# Patient Record
Sex: Female | Born: 1965 | Race: Black or African American | Hispanic: No | Marital: Single | Smoking: Current some day smoker
Health system: Southern US, Community
[De-identification: ages and names within clinical notes are randomized; demographics above are authoritative.]

## PROBLEM LIST (undated history)

## (undated) DIAGNOSIS — F32A Depression, unspecified: Secondary | ICD-10-CM

## (undated) DIAGNOSIS — F419 Anxiety disorder, unspecified: Secondary | ICD-10-CM

## (undated) DIAGNOSIS — Z72820 Sleep deprivation: Secondary | ICD-10-CM

## (undated) DIAGNOSIS — D573 Sickle-cell trait: Secondary | ICD-10-CM

## (undated) DIAGNOSIS — E039 Hypothyroidism, unspecified: Secondary | ICD-10-CM

## (undated) DIAGNOSIS — M1990 Unspecified osteoarthritis, unspecified site: Secondary | ICD-10-CM

## (undated) DIAGNOSIS — F329 Major depressive disorder, single episode, unspecified: Secondary | ICD-10-CM

## (undated) DIAGNOSIS — IMO0002 Reserved for concepts with insufficient information to code with codable children: Secondary | ICD-10-CM

## (undated) DIAGNOSIS — R011 Cardiac murmur, unspecified: Secondary | ICD-10-CM

## (undated) DIAGNOSIS — N289 Disorder of kidney and ureter, unspecified: Secondary | ICD-10-CM

---

## 1997-07-21 ENCOUNTER — Ambulatory Visit (HOSPITAL_COMMUNITY): Admission: RE | Admit: 1997-07-21 | Discharge: 1997-07-21 | Payer: Self-pay | Admitting: *Deleted

## 1997-08-19 ENCOUNTER — Emergency Department (HOSPITAL_COMMUNITY): Admission: EM | Admit: 1997-08-19 | Discharge: 1997-08-19 | Payer: Self-pay | Admitting: Emergency Medicine

## 1997-08-29 ENCOUNTER — Inpatient Hospital Stay (HOSPITAL_COMMUNITY): Admission: AD | Admit: 1997-08-29 | Discharge: 1997-08-31 | Payer: Self-pay | Admitting: Obstetrics & Gynecology

## 1997-09-07 ENCOUNTER — Encounter: Admission: RE | Admit: 1997-09-07 | Discharge: 1997-12-06 | Payer: Self-pay | Admitting: Obstetrics

## 1997-09-23 ENCOUNTER — Inpatient Hospital Stay (HOSPITAL_COMMUNITY): Admission: AD | Admit: 1997-09-23 | Discharge: 1997-10-03 | Payer: Self-pay | Admitting: *Deleted

## 1997-10-07 ENCOUNTER — Inpatient Hospital Stay (HOSPITAL_COMMUNITY): Admission: AD | Admit: 1997-10-07 | Discharge: 1997-10-07 | Payer: Self-pay | Admitting: *Deleted

## 1998-03-07 ENCOUNTER — Emergency Department (HOSPITAL_COMMUNITY): Admission: EM | Admit: 1998-03-07 | Discharge: 1998-03-07 | Payer: Self-pay | Admitting: Emergency Medicine

## 1999-09-02 ENCOUNTER — Encounter: Payer: Self-pay | Admitting: Emergency Medicine

## 1999-09-02 ENCOUNTER — Emergency Department (HOSPITAL_COMMUNITY): Admission: EM | Admit: 1999-09-02 | Discharge: 1999-09-02 | Payer: Self-pay | Admitting: Emergency Medicine

## 1999-12-17 ENCOUNTER — Emergency Department (HOSPITAL_COMMUNITY): Admission: EM | Admit: 1999-12-17 | Discharge: 1999-12-17 | Payer: Self-pay | Admitting: Emergency Medicine

## 1999-12-17 ENCOUNTER — Encounter: Payer: Self-pay | Admitting: Emergency Medicine

## 2000-04-20 ENCOUNTER — Emergency Department (HOSPITAL_COMMUNITY): Admission: EM | Admit: 2000-04-20 | Discharge: 2000-04-20 | Payer: Self-pay | Admitting: Emergency Medicine

## 2000-06-06 ENCOUNTER — Emergency Department (HOSPITAL_COMMUNITY): Admission: EM | Admit: 2000-06-06 | Discharge: 2000-06-07 | Payer: Self-pay | Admitting: Emergency Medicine

## 2000-06-07 ENCOUNTER — Encounter: Payer: Self-pay | Admitting: Emergency Medicine

## 2000-10-15 ENCOUNTER — Emergency Department (HOSPITAL_COMMUNITY): Admission: EM | Admit: 2000-10-15 | Discharge: 2000-10-15 | Payer: Self-pay

## 2001-03-22 ENCOUNTER — Emergency Department (HOSPITAL_COMMUNITY): Admission: EM | Admit: 2001-03-22 | Discharge: 2001-03-23 | Payer: Self-pay | Admitting: Emergency Medicine

## 2001-03-23 ENCOUNTER — Encounter: Payer: Self-pay | Admitting: Emergency Medicine

## 2002-02-18 ENCOUNTER — Emergency Department (HOSPITAL_COMMUNITY): Admission: EM | Admit: 2002-02-18 | Discharge: 2002-02-18 | Payer: Self-pay | Admitting: Emergency Medicine

## 2002-05-12 ENCOUNTER — Encounter: Payer: Self-pay | Admitting: Endocrinology

## 2002-05-12 ENCOUNTER — Ambulatory Visit (HOSPITAL_COMMUNITY): Admission: RE | Admit: 2002-05-12 | Discharge: 2002-05-12 | Payer: Self-pay | Admitting: Endocrinology

## 2002-05-27 ENCOUNTER — Ambulatory Visit (HOSPITAL_COMMUNITY): Admission: RE | Admit: 2002-05-27 | Discharge: 2002-05-27 | Payer: Self-pay | Admitting: Endocrinology

## 2002-05-27 ENCOUNTER — Encounter: Payer: Self-pay | Admitting: Endocrinology

## 2004-01-23 ENCOUNTER — Ambulatory Visit: Payer: Self-pay | Admitting: Family Medicine

## 2004-11-21 ENCOUNTER — Emergency Department (HOSPITAL_COMMUNITY): Admission: EM | Admit: 2004-11-21 | Discharge: 2004-11-21 | Payer: Self-pay | Admitting: Emergency Medicine

## 2005-01-22 ENCOUNTER — Emergency Department (HOSPITAL_COMMUNITY): Admission: EM | Admit: 2005-01-22 | Discharge: 2005-01-23 | Payer: Self-pay | Admitting: Emergency Medicine

## 2005-05-13 ENCOUNTER — Encounter
Admission: RE | Admit: 2005-05-13 | Discharge: 2005-07-04 | Payer: Self-pay | Admitting: Physical Medicine and Rehabilitation

## 2005-07-23 ENCOUNTER — Encounter: Admission: RE | Admit: 2005-07-23 | Discharge: 2005-07-23 | Payer: Self-pay | Admitting: Cardiology

## 2005-12-04 ENCOUNTER — Emergency Department (HOSPITAL_COMMUNITY): Admission: EM | Admit: 2005-12-04 | Discharge: 2005-12-04 | Payer: Self-pay | Admitting: Emergency Medicine

## 2006-08-21 ENCOUNTER — Emergency Department (HOSPITAL_COMMUNITY): Admission: EM | Admit: 2006-08-21 | Discharge: 2006-08-21 | Payer: Self-pay | Admitting: Family Medicine

## 2007-11-13 ENCOUNTER — Emergency Department (HOSPITAL_COMMUNITY): Admission: EM | Admit: 2007-11-13 | Discharge: 2007-11-13 | Payer: Self-pay | Admitting: Family Medicine

## 2008-07-09 ENCOUNTER — Emergency Department (HOSPITAL_COMMUNITY): Admission: EM | Admit: 2008-07-09 | Discharge: 2008-07-10 | Payer: Self-pay | Admitting: Emergency Medicine

## 2009-11-16 ENCOUNTER — Encounter: Admission: RE | Admit: 2009-11-16 | Discharge: 2009-11-16 | Payer: Self-pay | Admitting: Internal Medicine

## 2009-11-27 ENCOUNTER — Encounter: Admission: RE | Admit: 2009-11-27 | Discharge: 2009-11-27 | Payer: Self-pay | Admitting: Internal Medicine

## 2010-03-12 ENCOUNTER — Other Ambulatory Visit: Payer: Self-pay | Admitting: Internal Medicine

## 2010-03-12 DIAGNOSIS — Z09 Encounter for follow-up examination after completed treatment for conditions other than malignant neoplasm: Secondary | ICD-10-CM

## 2010-03-12 DIAGNOSIS — N6002 Solitary cyst of left breast: Secondary | ICD-10-CM

## 2010-03-26 ENCOUNTER — Emergency Department (HOSPITAL_COMMUNITY)
Admission: EM | Admit: 2010-03-26 | Discharge: 2010-03-26 | Disposition: A | Discharge status: Home / Self Care | Payer: Medicare Other | Attending: Emergency Medicine | Admitting: Emergency Medicine

## 2010-03-26 ENCOUNTER — Emergency Department (HOSPITAL_COMMUNITY): Payer: Medicare Other

## 2010-03-26 DIAGNOSIS — J3489 Other specified disorders of nose and nasal sinuses: Secondary | ICD-10-CM | POA: Insufficient documentation

## 2010-03-26 DIAGNOSIS — J40 Bronchitis, not specified as acute or chronic: Secondary | ICD-10-CM | POA: Insufficient documentation

## 2010-03-26 DIAGNOSIS — F329 Major depressive disorder, single episode, unspecified: Secondary | ICD-10-CM | POA: Insufficient documentation

## 2010-03-26 DIAGNOSIS — R0602 Shortness of breath: Secondary | ICD-10-CM | POA: Insufficient documentation

## 2010-03-26 DIAGNOSIS — R059 Cough, unspecified: Secondary | ICD-10-CM | POA: Insufficient documentation

## 2010-03-26 DIAGNOSIS — R05 Cough: Secondary | ICD-10-CM | POA: Insufficient documentation

## 2010-03-26 DIAGNOSIS — F3289 Other specified depressive episodes: Secondary | ICD-10-CM | POA: Insufficient documentation

## 2010-03-26 DIAGNOSIS — E039 Hypothyroidism, unspecified: Secondary | ICD-10-CM | POA: Insufficient documentation

## 2010-03-26 DIAGNOSIS — J45909 Unspecified asthma, uncomplicated: Secondary | ICD-10-CM | POA: Insufficient documentation

## 2010-05-31 ENCOUNTER — Other Ambulatory Visit: Payer: Self-pay

## 2010-06-21 ENCOUNTER — Other Ambulatory Visit: Payer: Medicare Other

## 2010-07-12 ENCOUNTER — Ambulatory Visit
Admission: RE | Admit: 2010-07-12 | Discharge: 2010-07-12 | Disposition: A | Discharge status: Home / Self Care | Payer: Medicare Other | Source: Ambulatory Visit | Attending: Internal Medicine | Admitting: Internal Medicine

## 2010-07-12 DIAGNOSIS — N6002 Solitary cyst of left breast: Secondary | ICD-10-CM

## 2010-10-17 ENCOUNTER — Other Ambulatory Visit: Payer: Self-pay | Admitting: Internal Medicine

## 2010-10-17 DIAGNOSIS — Z1231 Encounter for screening mammogram for malignant neoplasm of breast: Secondary | ICD-10-CM

## 2010-10-28 LAB — POCT URINALYSIS DIP (DEVICE)
Bilirubin Urine: NEGATIVE
Glucose, UA: NEGATIVE
Ketones, ur: NEGATIVE
Operator id: 235561
Specific Gravity, Urine: 1.015

## 2010-11-20 ENCOUNTER — Ambulatory Visit
Admission: RE | Admit: 2010-11-20 | Discharge: 2010-11-20 | Disposition: A | Discharge status: Home / Self Care | Payer: Medicare Other | Source: Ambulatory Visit | Attending: Internal Medicine | Admitting: Internal Medicine

## 2010-11-20 DIAGNOSIS — Z1231 Encounter for screening mammogram for malignant neoplasm of breast: Secondary | ICD-10-CM

## 2011-02-07 ENCOUNTER — Encounter (HOSPITAL_COMMUNITY): Payer: Self-pay | Admitting: *Deleted

## 2011-02-07 ENCOUNTER — Emergency Department (HOSPITAL_COMMUNITY)
Admission: EM | Admit: 2011-02-07 | Discharge: 2011-02-07 | Disposition: A | Discharge status: Home / Self Care | Payer: Medicare Other | Attending: Emergency Medicine | Admitting: Emergency Medicine

## 2011-02-07 DIAGNOSIS — R05 Cough: Secondary | ICD-10-CM | POA: Insufficient documentation

## 2011-02-07 DIAGNOSIS — R51 Headache: Secondary | ICD-10-CM | POA: Insufficient documentation

## 2011-02-07 DIAGNOSIS — R059 Cough, unspecified: Secondary | ICD-10-CM | POA: Insufficient documentation

## 2011-02-07 DIAGNOSIS — B349 Viral infection, unspecified: Secondary | ICD-10-CM

## 2011-02-07 DIAGNOSIS — R011 Cardiac murmur, unspecified: Secondary | ICD-10-CM | POA: Insufficient documentation

## 2011-02-07 DIAGNOSIS — R509 Fever, unspecified: Secondary | ICD-10-CM | POA: Insufficient documentation

## 2011-02-07 DIAGNOSIS — IMO0001 Reserved for inherently not codable concepts without codable children: Secondary | ICD-10-CM | POA: Insufficient documentation

## 2011-02-07 DIAGNOSIS — J029 Acute pharyngitis, unspecified: Secondary | ICD-10-CM | POA: Insufficient documentation

## 2011-02-07 DIAGNOSIS — B9789 Other viral agents as the cause of diseases classified elsewhere: Secondary | ICD-10-CM | POA: Insufficient documentation

## 2011-02-07 HISTORY — DX: Major depressive disorder, single episode, unspecified: F32.9

## 2011-02-07 HISTORY — DX: Depression, unspecified: F32.A

## 2011-02-07 HISTORY — DX: Reserved for concepts with insufficient information to code with codable children: IMO0002

## 2011-02-07 HISTORY — DX: Unspecified osteoarthritis, unspecified site: M19.90

## 2011-02-07 HISTORY — DX: Cardiac murmur, unspecified: R01.1

## 2011-02-07 HISTORY — DX: Sleep deprivation: Z72.820

## 2011-02-07 MED ORDER — ACETAMINOPHEN 325 MG PO TABS
ORAL_TABLET | ORAL | Status: AC
  Administered 2011-02-07: 975 mg
  Filled 2011-02-07: qty 3

## 2011-02-07 MED ORDER — ACETAMINOPHEN 500 MG PO TABS
1000.0000 mg | ORAL_TABLET | Freq: Once | ORAL | Status: DC
  Filled 2011-02-07: qty 2

## 2011-02-07 MED ORDER — IBUPROFEN 800 MG PO TABS
800.0000 mg | ORAL_TABLET | Freq: Once | ORAL | Status: AC
  Administered 2011-02-07: 800 mg via ORAL
  Filled 2011-02-07: qty 1

## 2011-02-07 MED ORDER — OSELTAMIVIR PHOSPHATE 75 MG PO CAPS: 75.0000 mg | ORAL_CAPSULE | Freq: Two times a day (BID) | ORAL | Status: AC

## 2011-02-07 NOTE — ED Provider Notes (Signed)
History     CSN: [id number]  Arrival date & time 02/07/11  0908   First MD Initiated Contact with Patient 02/07/11 0915      Chief Complaint  Patient presents with  . Chills  . Influenza    (Consider location/radiation/quality/duration/timing/severity/associated sxs/prior treatment) HPI... sore throat, headache, fever, chills, cough, achiness for 24 hours.  Has not had the flu shot. Ibuprofen helps some. Good by mouth intake. Symptoms are moderate.  Past Medical History  Diagnosis Date  . Arthritis   . Prolapsed disk   . Heart murmur   . Sleep deprivation   . Depression     Past Surgical History  Procedure Date  . Cesarean section     No family history on file.  History  Substance Use Topics  . Smoking status: Former Games developer  . Smokeless tobacco: Not on file  . Alcohol Use: No    OB History    Grav Para Term Preterm Abortions TAB SAB Ect Mult Living                  Review of Systems  All other systems reviewed and are negative.    Allergies  Review of patient's allergies indicates no known allergies.  Home Medications   Current Outpatient Rx  Name Route Sig Dispense Refill  . BECLOMETHASONE DIPROPIONATE 40 MCG/ACT IN AERS Inhalation Inhale 2 puffs into the lungs 2 (two) times daily.    Marland Kitchen BENZTROPINE MESYLATE 1 MG PO TABS Oral Take 1 mg by mouth daily.    Marland Kitchen FLUOXETINE HCL 20 MG PO CAPS Oral Take 20 mg by mouth daily.    . IBUPROFEN 800 MG PO TABS Oral Take 800 mg by mouth every 8 (eight) hours as needed. For pain    . LEVOTHYROXINE SODIUM 75 MCG PO TABS Oral Take 75 mcg by mouth daily.    Marland Kitchen MIRTAZAPINE 15 MG PO TABS Oral Take 15 mg by mouth at bedtime.    . OXYCODONE-ACETAMINOPHEN 10-325 MG PO TABS Oral Take 1 tablet by mouth 2 (two) times daily.    Marland Kitchen RISPERIDONE 4 MG PO TABS Oral Take 4 mg by mouth daily.    . TRAZODONE HCL 100 MG PO TABS Oral Take 100 mg by mouth at bedtime.    . OSELTAMIVIR PHOSPHATE 75 MG PO CAPS Oral Take 1 capsule (75 mg total)  by mouth every 12 (twelve) hours. 10 capsule 0    BP 118/75  Pulse 115  Temp(Src) 98.7 F (37.1 C) (Oral)  Resp 18  SpO2 98%  Physical Exam  Nursing note and vitals reviewed. Constitutional: She is oriented to person, place, and time. She appears well-developed and well-nourished.       Well-hydrated  HENT:  Head: Normocephalic and atraumatic.  Eyes: Conjunctivae and EOM are normal. Pupils are equal, round, and reactive to light.  Neck: Normal range of motion. Neck supple.  Cardiovascular: Normal rate and regular rhythm.   Pulmonary/Chest: Effort normal and breath sounds normal.  Abdominal: Soft. Bowel sounds are normal.  Musculoskeletal: Normal range of motion.  Neurological: She is alert and oriented to person, place, and time.  Skin: Skin is warm and dry.  Psychiatric: She has a normal mood and affect.    ED Course  Procedures (including critical care time)  Labs Reviewed - No data to display No results found.   1. Viral syndrome       MDM  Patient is nontoxic. History and physical most consistent with influenza. Rx  Tamiflu        Donnetta Hutching, MD 02/07/11 [phone]

## 2011-02-07 NOTE — ED Notes (Signed)
Flu like symptoms; h/a, chills, hot, body aches, back ache, eyes itching, etc. Only able to keep fluids down, no solids. Non-productive cough.

## 2011-11-11 ENCOUNTER — Other Ambulatory Visit: Payer: Self-pay | Admitting: Internal Medicine

## 2011-11-11 DIAGNOSIS — Z1231 Encounter for screening mammogram for malignant neoplasm of breast: Secondary | ICD-10-CM

## 2011-12-11 ENCOUNTER — Ambulatory Visit: Payer: Medicare Other

## 2011-12-15 ENCOUNTER — Encounter (HOSPITAL_COMMUNITY): Payer: Self-pay

## 2011-12-15 ENCOUNTER — Observation Stay (HOSPITAL_COMMUNITY)
Admission: EM | Admit: 2011-12-15 | Discharge: 2011-12-19 | Disposition: A | Discharge status: Home / Self Care | Payer: Medicare Other | Attending: Internal Medicine | Admitting: Internal Medicine

## 2011-12-15 DIAGNOSIS — F32A Depression, unspecified: Secondary | ICD-10-CM | POA: Diagnosis present

## 2011-12-15 DIAGNOSIS — F329 Major depressive disorder, single episode, unspecified: Secondary | ICD-10-CM | POA: Diagnosis present

## 2011-12-15 DIAGNOSIS — K5289 Other specified noninfective gastroenteritis and colitis: Secondary | ICD-10-CM

## 2011-12-15 DIAGNOSIS — E876 Hypokalemia: Secondary | ICD-10-CM | POA: Insufficient documentation

## 2011-12-15 DIAGNOSIS — K529 Noninfective gastroenteritis and colitis, unspecified: Secondary | ICD-10-CM

## 2011-12-15 DIAGNOSIS — E039 Hypothyroidism, unspecified: Secondary | ICD-10-CM | POA: Diagnosis present

## 2011-12-15 DIAGNOSIS — R109 Unspecified abdominal pain: Secondary | ICD-10-CM | POA: Insufficient documentation

## 2011-12-15 DIAGNOSIS — N390 Urinary tract infection, site not specified: Secondary | ICD-10-CM | POA: Diagnosis present

## 2011-12-15 DIAGNOSIS — F3289 Other specified depressive episodes: Secondary | ICD-10-CM | POA: Insufficient documentation

## 2011-12-15 DIAGNOSIS — R112 Nausea with vomiting, unspecified: Principal | ICD-10-CM | POA: Diagnosis present

## 2011-12-15 DIAGNOSIS — Z79899 Other long term (current) drug therapy: Secondary | ICD-10-CM | POA: Insufficient documentation

## 2011-12-15 DIAGNOSIS — D649 Anemia, unspecified: Secondary | ICD-10-CM | POA: Insufficient documentation

## 2011-12-15 LAB — CBC
Hemoglobin: 13 g/dL (ref 12.0–15.0)
Hemoglobin: 11.9 g/dL — ABNORMAL LOW (ref 12.0–15.0)
MCH: 23.3 pg — ABNORMAL LOW (ref 26.0–34.0)
Platelets: 209 10*3/uL (ref 150–400)
RBC: 5.59 MIL/uL — ABNORMAL HIGH (ref 3.87–5.11)
RBC: 5.08 MIL/uL (ref 3.87–5.11)
WBC: 8.5 10*3/uL (ref 4.0–10.5)
WBC: 8.9 10*3/uL (ref 4.0–10.5)

## 2011-12-15 LAB — URINE MICROSCOPIC-ADD ON

## 2011-12-15 LAB — URINALYSIS, ROUTINE W REFLEX MICROSCOPIC
Bilirubin Urine: NEGATIVE
Glucose, UA: NEGATIVE mg/dL
Ketones, ur: 40 mg/dL — AB
Protein, ur: NEGATIVE mg/dL
Urobilinogen, UA: 0.2 mg/dL (ref 0.0–1.0)

## 2011-12-15 LAB — COMPREHENSIVE METABOLIC PANEL
ALT: 19 U/L (ref 0–35)
Alkaline Phosphatase: 55 U/L (ref 39–117)
CO2: 23 mEq/L (ref 19–32)
Calcium: 9.5 mg/dL (ref 8.4–10.5)
Chloride: 105 mEq/L (ref 96–112)
GFR calc Af Amer: 87 mL/min — ABNORMAL LOW (ref >90)
GFR calc non Af Amer: 75 mL/min — ABNORMAL LOW (ref >90)
Glucose, Bld: 124 mg/dL — ABNORMAL HIGH (ref 70–99)
Sodium: 141 mEq/L (ref 135–145)
Total Bilirubin: 0.7 mg/dL (ref 0.3–1.2)

## 2011-12-15 LAB — CREATININE, SERUM
Creatinine, Ser: 0.82 mg/dL (ref 0.50–1.10)
GFR calc Af Amer: >90 mL/min (ref >90)
GFR calc non Af Amer: 86 mL/min — ABNORMAL LOW (ref >90)

## 2011-12-15 MED ORDER — TRAZODONE HCL 100 MG PO TABS
100.0000 mg | ORAL_TABLET | Freq: Every day | ORAL | Status: DC
  Administered 2011-12-16 – 2011-12-18 (×3): 100 mg via ORAL
  Filled 2011-12-15 (×7): qty 1

## 2011-12-15 MED ORDER — ONDANSETRON HCL 4 MG PO TABS
4.0000 mg | ORAL_TABLET | Freq: Four times a day (QID) | ORAL | Status: DC | PRN
  Filled 2011-12-15: qty 1

## 2011-12-15 MED ORDER — BENZTROPINE MESYLATE 1 MG PO TABS
1.0000 mg | ORAL_TABLET | Freq: Every day | ORAL | Status: DC
  Administered 2011-12-18 – 2011-12-19 (×2): 1 mg via ORAL
  Filled 2011-12-15 (×5): qty 1

## 2011-12-15 MED ORDER — RISPERIDONE 2 MG PO TABS
4.0000 mg | ORAL_TABLET | Freq: Every day | ORAL | Status: DC
  Administered 2011-12-18 – 2011-12-19 (×2): 4 mg via ORAL
  Filled 2011-12-15 (×5): qty 2

## 2011-12-15 MED ORDER — ONDANSETRON HCL 4 MG/2ML IJ SOLN
4.0000 mg | Freq: Once | INTRAMUSCULAR | Status: AC
  Administered 2011-12-15: 4 mg via INTRAVENOUS
  Filled 2011-12-15: qty 2

## 2011-12-15 MED ORDER — HYDROCODONE-ACETAMINOPHEN 5-325 MG PO TABS
1.0000 | ORAL_TABLET | ORAL | Status: DC | PRN
  Administered 2011-12-18 – 2011-12-19 (×4): 2 via ORAL
  Filled 2011-12-15 (×4): qty 2

## 2011-12-15 MED ORDER — OXYCODONE-ACETAMINOPHEN 5-325 MG PO TABS
1.0000 | ORAL_TABLET | Freq: Four times a day (QID) | ORAL | Status: AC | PRN
  Administered 2011-12-15 – 2011-12-17 (×2): 1 via ORAL
  Filled 2011-12-15 (×2): qty 1

## 2011-12-15 MED ORDER — MORPHINE SULFATE 4 MG/ML IJ SOLN
1.0000 mg | INTRAMUSCULAR | Status: DC | PRN
  Administered 2011-12-15 – 2011-12-19 (×15): 1 mg via INTRAVENOUS
  Filled 2011-12-15 (×15): qty 1

## 2011-12-15 MED ORDER — SODIUM CHLORIDE 0.9 % IV SOLN
1000.0000 mL | Freq: Once | INTRAVENOUS | Status: AC
  Administered 2011-12-15: 1000 mL via INTRAVENOUS

## 2011-12-15 MED ORDER — DEXTROSE 5 % IV SOLN
1.0000 g | INTRAVENOUS | Status: DC
  Administered 2011-12-16 – 2011-12-19 (×4): 1 g via INTRAVENOUS
  Filled 2011-12-15 (×4): qty 10

## 2011-12-15 MED ORDER — HYDROMORPHONE HCL PF 1 MG/ML IJ SOLN: 1.0000 mg | Freq: Once | INTRAMUSCULAR | Status: DC

## 2011-12-15 MED ORDER — DIPHENHYDRAMINE HCL 50 MG/ML IJ SOLN
12.5000 mg | Freq: Once | INTRAMUSCULAR | Status: AC
  Administered 2011-12-15: 12.5 mg via INTRAVENOUS
  Filled 2011-12-15: qty 1

## 2011-12-15 MED ORDER — LOPERAMIDE HCL 2 MG PO CAPS: 4.0000 mg | ORAL_CAPSULE | ORAL | Status: DC | PRN

## 2011-12-15 MED ORDER — PROMETHAZINE HCL 25 MG/ML IJ SOLN
12.5000 mg | Freq: Once | INTRAMUSCULAR | Status: AC
  Administered 2011-12-15: 12.5 mg via INTRAVENOUS
  Filled 2011-12-15: qty 1

## 2011-12-15 MED ORDER — DIPHENOXYLATE-ATROPINE 2.5-0.025 MG PO TABS: 1.0000 | ORAL_TABLET | Freq: Once | ORAL | Status: DC

## 2011-12-15 MED ORDER — HEPARIN SODIUM (PORCINE) 5000 UNIT/ML IJ SOLN
5000.0000 [IU] | Freq: Three times a day (TID) | INTRAMUSCULAR | Status: DC
  Filled 2011-12-15 (×14): qty 1

## 2011-12-15 MED ORDER — ONDANSETRON HCL 4 MG/2ML IJ SOLN: 4.0000 mg | Freq: Once | INTRAMUSCULAR | Status: DC

## 2011-12-15 MED ORDER — DEXTROSE 5 % IV SOLN
1.0000 g | Freq: Once | INTRAVENOUS | Status: AC
  Administered 2011-12-15: 1 g via INTRAVENOUS
  Filled 2011-12-15: qty 10

## 2011-12-15 MED ORDER — FLUOXETINE HCL 20 MG PO CAPS
20.0000 mg | ORAL_CAPSULE | Freq: Every day | ORAL | Status: DC
  Administered 2011-12-18 – 2011-12-19 (×2): 20 mg via ORAL
  Filled 2011-12-15 (×5): qty 1

## 2011-12-15 MED ORDER — FAMOTIDINE IN NACL 20-0.9 MG/50ML-% IV SOLN
20.0000 mg | Freq: Once | INTRAVENOUS | Status: AC
  Administered 2011-12-15: 20 mg via INTRAVENOUS
  Filled 2011-12-15: qty 50

## 2011-12-15 MED ORDER — LORAZEPAM 2 MG/ML IJ SOLN
1.0000 mg | Freq: Once | INTRAMUSCULAR | Status: AC
  Administered 2011-12-15: 1 mg via INTRAVENOUS
  Filled 2011-12-15: qty 1

## 2011-12-15 MED ORDER — KCL IN DEXTROSE-NACL 20-5-0.45 MEQ/L-%-% IV SOLN
INTRAVENOUS | Status: DC
  Administered 2011-12-15 – 2011-12-16 (×3): via INTRAVENOUS
  Administered 2011-12-17: 100 mL via INTRAVENOUS
  Administered 2011-12-17: 07:00:00 via INTRAVENOUS
  Administered 2011-12-18: 1000 mL via INTRAVENOUS
  Filled 2011-12-15 (×14): qty 1000

## 2011-12-15 MED ORDER — PSYLLIUM 95 % PO PACK
1.0000 | PACK | Freq: Every day | ORAL | Status: DC
  Administered 2011-12-18 – 2011-12-19 (×2): 1 via ORAL
  Filled 2011-12-15 (×5): qty 1

## 2011-12-15 MED ORDER — FLUTICASONE PROPIONATE HFA 44 MCG/ACT IN AERO
2.0000 | INHALATION_SPRAY | Freq: Two times a day (BID) | RESPIRATORY_TRACT | Status: DC
  Administered 2011-12-15 – 2011-12-19 (×5): 2 via RESPIRATORY_TRACT
  Filled 2011-12-15 (×2): qty 10.6

## 2011-12-15 MED ORDER — MIRTAZAPINE 15 MG PO TABS
15.0000 mg | ORAL_TABLET | Freq: Every day | ORAL | Status: DC
  Administered 2011-12-15 – 2011-12-18 (×4): 15 mg via ORAL
  Filled 2011-12-15 (×6): qty 1

## 2011-12-15 MED ORDER — MORPHINE SULFATE 4 MG/ML IJ SOLN
4.0000 mg | Freq: Once | INTRAMUSCULAR | Status: AC
  Administered 2011-12-15: 4 mg via INTRAVENOUS
  Filled 2011-12-15: qty 1

## 2011-12-15 MED ORDER — ONDANSETRON 8 MG/NS 50 ML IVPB
8.0000 mg | Freq: Four times a day (QID) | INTRAVENOUS | Status: DC | PRN
  Administered 2011-12-16 (×2): 8 mg via INTRAVENOUS
  Filled 2011-12-15 (×5): qty 8

## 2011-12-15 MED ORDER — FENTANYL CITRATE 0.05 MG/ML IJ SOLN
50.0000 ug | Freq: Once | INTRAMUSCULAR | Status: AC
  Administered 2011-12-15: 50 ug via INTRAVENOUS
  Filled 2011-12-15: qty 2

## 2011-12-15 MED ORDER — SODIUM CHLORIDE 0.9 % IV SOLN
1000.0000 mL | INTRAVENOUS | Status: DC
  Administered 2011-12-15: 1000 mL via INTRAVENOUS

## 2011-12-15 MED ORDER — PROCHLORPERAZINE EDISYLATE 5 MG/ML IJ SOLN: 10.0000 mg | Freq: Four times a day (QID) | INTRAMUSCULAR | Status: DC | PRN

## 2011-12-15 MED ORDER — METOCLOPRAMIDE HCL 5 MG/ML IJ SOLN
10.0000 mg | Freq: Once | INTRAMUSCULAR | Status: AC
  Administered 2011-12-15: 10 mg via INTRAVENOUS
  Filled 2011-12-15: qty 2

## 2011-12-15 MED ORDER — ONDANSETRON HCL 8 MG PO TABS: 4.0000 mg | ORAL_TABLET | Freq: Four times a day (QID) | ORAL | Status: DC | PRN

## 2011-12-15 MED ORDER — LEVOTHYROXINE SODIUM 75 MCG PO TABS
75.0000 ug | ORAL_TABLET | Freq: Every day | ORAL | Status: DC
  Administered 2011-12-16 – 2011-12-19 (×3): 75 ug via ORAL
  Filled 2011-12-15 (×5): qty 1

## 2011-12-15 NOTE — ED Notes (Signed)
Pt is continuing to vomit

## 2011-12-15 NOTE — ED Notes (Signed)
PT WITH FEMALE VISITOR. HE STATES HE WANTS TO KNOW WHAT WE ARE DOING BECAUSE HE HAS TO GO BACK TO [place] TOMORROW AND PT SISTER BDAY IS TODAY. STATES HE JUST WANTS TO KNOW WHAT HE SHOULD DO. PATIENT HAD BEEN SLEEPING UNTIL HIS ARRIVAL. NOW HE HAS COME TO THE DESK STATING THE PT WANTS TO GO HOME. PA IN TO [facility] PT

## 2011-12-15 NOTE — H&P (Signed)
[facility] History and Physical  DYMOND WIRZ ZOX:[id number] DOB: 11-12-1965 DOA: 12/15/2011  Referring physician: Sherron Monday with Remi Haggard NP PCP: Dorrene German, MD  Specialists: none  Chief Complaint: nausea with emesis, and diarrhea  HPI: Victoria Flores is a 46 y.o. female  That presents with  5 day complaint of nonbloody diarrhea.  States that this occurred after eating at a [place] [employer] (she ate chicken and a spoonful of coleslaw).  Reports that her brother had diarrhea that night as well.  Reportedly developed nausea yesterday evening.  Has had difficulty keeping anything down.  Nausea is worse any time she tries to consume or drink fluids.  The antiemetics in the ED have helped. Patient is unsure how may times she has had emesis over the day.  Has subsequently developed chest discomfort.  Denies any bloody emesis.  Also endorses polyuria with no complaints of any dysuria.  In ED patient was given antiemetics with minimal relief and was unable to tolerate po intake as such we were consulted for further evaluation and recommendations. U/A showed Hazy urine with positive nitrite, small leukocytes, moderate hbg, and many bacteria.  Review of Systems: The patient denies  + fever (warm to the touch), weight loss,, vision loss, decreased hearing, hoarseness, + chest pain, syncope, dyspnea on exertion, peripheral edema, balance deficits, hemoptysis, + abdominal pain, melena, hematochezia, severe indigestion/heartburn, hematuria, incontinence, genital sores, muscle weakness, suspicious skin lesions, transient blindness, difficulty walking, + depression, unusual weight change, abnormal bleeding, enlarged lymph nodes, angioedema, and breast masses.    Past Medical History  Diagnosis Date  . Arthritis   . Prolapsed disk   . Heart murmur   . Sleep deprivation   . Depression    Past Surgical History  Procedure Date  . Cesarean section    Social History:  reports that she  has quit smoking. She does not have any smokeless tobacco history on file. She reports that she does not drink alcohol or use illicit drugs. Lives at home Can patient participate in ADLs? yes  No Known Allergies  History reviewed. No pertinent family history. Reports family history of diabetes  Prior to Admission medications   Medication Sig Start Date End Date Taking? Authorizing Provider  beclomethasone (QVAR) 40 MCG/ACT inhaler Inhale 2 puffs into the lungs 2 (two) times daily.   Yes Historical Provider, MD  benztropine (COGENTIN) 1 MG tablet Take 1 mg by mouth daily.   Yes Historical Provider, MD  FLUoxetine (PROZAC) 20 MG capsule Take 20 mg by mouth daily.   Yes Historical Provider, MD  levothyroxine (SYNTHROID, LEVOTHROID) 75 MCG tablet Take 75 mcg by mouth daily.   Yes Historical Provider, MD  mirtazapine (REMERON) 15 MG tablet Take 15 mg by mouth at bedtime.   Yes Historical Provider, MD  oxyCODONE-acetaminophen (PERCOCET) 10-325 MG per tablet Take 1 tablet by mouth 3 (three) times daily.    Yes Historical Provider, MD  risperidone (RISPERDAL) 4 MG tablet Take 4 mg by mouth daily.   Yes Historical Provider, MD  traZODone (DESYREL) 100 MG tablet Take 100 mg by mouth at bedtime.   Yes Historical Provider, MD   Physical Exam: Filed Vitals:   12/15/11 1100 12/15/11 1200 12/15/11 1300 12/15/11 1646  BP: 137/68 119/77 108/51 112/56  Pulse: 62 65 59 65  Temp:      TempSrc:      Resp:    20  SpO2: 100% 100% 100% 100%     General:  Pt  in NAD, A and O x 3  Eyes: EOMI, No icterus  ENT: normal exterior appearance, no masses on visual inspection, dry mucus membranes  Neck: supple, no goiter  Cardiovascular: RRR, warm extremities, no murmurs  Respiratory: CTA BL, no wheezes  Abdomen: Soft, +suprapubic tenderness  Skin: no rashes, warm and dry  Musculoskeletal: no clubbing no pain with motion  Psychiatric: flat affect, mood appropriate  Neurologic: answers questions  appropriately moves all extremities.  Labs on Admission:  Basic Metabolic Panel:  Lab 12/15/11 8657  NA 141  K 3.4*  CL 105  CO2 23  GLUCOSE 124*  BUN 9  CREATININE 0.92  CALCIUM 9.5  MG --  PHOS --   Liver Function Tests:  Lab 12/15/11 0735  AST 15  ALT 19  ALKPHOS 55  BILITOT 0.7  PROT 7.6  ALBUMIN 3.9    Lab 12/15/11 0735  LIPASE 23  AMYLASE --   No results found for this basename: AMMONIA:5 in the last 168 hours CBC:  Lab 12/15/11 0735  WBC 8.5  NEUTROABS --  HGB 13.0  HCT 38.9  MCV 69.6*  PLT 243   Cardiac Enzymes: No results found for this basename: CKTOTAL:5,CKMB:5,CKMBINDEX:5,TROPONINI:5 in the last 168 hours  BNP (last 3 results) No results found for this basename: PROBNP:3 in the last 8760 hours CBG: No results found for this basename: GLUCAP:5 in the last 168 hours  Radiological Exams on Admission: No results found.  EKG: Non in ED  Assessment/Plan Active Problems:  Nausea and vomiting in adult  UTI (lower urinary tract infection)  Depression   1. Nausea and vomitting - At this point etiology likely related to UTI, suspect that abrubt discontinuation of SSRI's is also compounding the problem and making things worse - supportive therapy - Anti emetics IV - Place on maintenance IVF's - Clears, full, regular  (advance diet as tolerated)  2. UTI - U/A shows positive nitrite, leukocytes, moderate hgb - Place patient on Rocephin - Urine culture  3. Depression - Continue home regimen once nausea and emesis improved. - Suspect abrubt cessation of SSRI due to nausea and emesis likely contributing to # 1  4. Hypothyroidism - Will order TSH to see if it is contributing to complaints of diarrhea - Continue home regimen  5. Diarrhea - Could be related to infectious etiology, no bloody diarrhea reported.  If infectious etiology would avoid loperamide at this juncture. - add metamucil and reassess. - stool cultures.   Code Status:  full although patient wishes to be DNI Family Communication: spoke with patient and daughter at bedside Disposition Plan: Pending continued improvement in condition  Time spent: > 50 minutes  Penny Pia [facility] Pager [phone]  If 7PM-7AM, please contact night-coverage [email or web address] Password [facility] 12/15/2011, 5:36 PM

## 2011-12-15 NOTE — Progress Notes (Signed)
Pt c/o abdominal pain. vicodin offered. Patient stated "I do not take vicodin, I take percocet." NP, Craige Cotta notified. Order for percocet obtained.

## 2011-12-15 NOTE — ED Provider Notes (Signed)
Medical screening examination/treatment/procedure(s) were performed by non-physician practitioner and as supervising physician I was immediately available for consultation/collaboration.  Jones Skene, M.D.     Jones Skene, MD 12/15/11 2157

## 2011-12-15 NOTE — ED Provider Notes (Signed)
3:38pm Patient care assumed from [place], [place]. Patient complaining of continued abdominal pain and nausea. Given pain medication and Zofran with improvement. Cammy Copa will admit the patient. Will be admitted by Dr. Cena Benton.  Pixie Casino, PA-C 12/15/11 1754

## 2011-12-15 NOTE — ED Provider Notes (Signed)
Medical screening examination/treatment/procedure(s) were performed by non-physician practitioner and as supervising physician I was immediately available for consultation/collaboration.  John-Adam Cataleya Cristina, M.D.     John-Adam Mellina Benison, MD 12/15/11 2156 

## 2011-12-15 NOTE — ED Provider Notes (Signed)
History     CSN: [id number]  Arrival date & time 12/15/11  0708   First MD Initiated Contact with Patient 12/15/11 0715      No chief complaint on file.   (Consider location/radiation/quality/duration/timing/severity/associated sxs/prior treatment) Patient is a 46 y.o. female presenting with vomiting. The history is provided by the patient. No language interpreter was used.  Emesis  This is a new problem. The current episode started 12 to 24 hours ago. The problem occurs more than 10 times per day. The problem has been gradually worsening. The emesis has an appearance of stomach contents. There has been no fever. Associated symptoms include abdominal pain and diarrhea. Pertinent negatives include no chills, no cough and no fever.   46 year old female coming in with complaint of nausea vomiting and diarrhea since 9:00 last night. States that when she has vomited more than to 10 times and had diarrhea more than 10 times since last night. Patient denies fever. States she's having suprapubic pain epigastric pain it radiates into her chest that is constant. States that her lower back also hurts with a past medical history of a herniated disc in the lumbar area. Patient is tearful. Past medical history of heart murmur, arthritis, herniated disc, depression. Patient is on multiple meds for sleep. Denies antibiotic use in the last 3 months. Patient does smoke. No etoh.   Past Medical History  Diagnosis Date  . Arthritis   . Prolapsed disk   . Heart murmur   . Sleep deprivation   . Depression     Past Surgical History  Procedure Date  . Cesarean section     No family history on file.  History  Substance Use Topics  . Smoking status: Former Games developer  . Smokeless tobacco: Not on file  . Alcohol Use: No    OB History    Grav Para Term Preterm Abortions TAB SAB Ect Mult Living                  Review of Systems  Constitutional: Negative.  Negative for fever and chills.  HENT:  Negative.   Eyes: Negative.   Respiratory: Negative.  Negative for cough and shortness of breath.   Cardiovascular: Positive for chest pain.  Gastrointestinal: Positive for nausea, vomiting, abdominal pain and diarrhea. Negative for blood in stool and abdominal distention.  Genitourinary: Negative for dysuria, urgency, hematuria and difficulty urinating.  Neurological: Negative.  Negative for dizziness, syncope, weakness and light-headedness.  Psychiatric/Behavioral: Negative.   All other systems reviewed and are negative.    Allergies  Review of patient's allergies indicates no known allergies.  Home Medications   Current Outpatient Rx  Name  Route  Sig  Dispense  Refill  . BECLOMETHASONE DIPROPIONATE 40 MCG/ACT IN AERS   Inhalation   Inhale 2 puffs into the lungs 2 (two) times daily.         Marland Kitchen BENZTROPINE MESYLATE 1 MG PO TABS   Oral   Take 1 mg by mouth daily.         Marland Kitchen FLUOXETINE HCL 20 MG PO CAPS   Oral   Take 20 mg by mouth daily.         . IBUPROFEN 800 MG PO TABS   Oral   Take 800 mg by mouth every 8 (eight) hours as needed. For pain         . LEVOTHYROXINE SODIUM 75 MCG PO TABS   Oral   Take 75 mcg by mouth daily.         Marland Kitchen  MIRTAZAPINE 15 MG PO TABS   Oral   Take 15 mg by mouth at bedtime.         . OXYCODONE-ACETAMINOPHEN 10-325 MG PO TABS   Oral   Take 1 tablet by mouth 2 (two) times daily.         Marland Kitchen RISPERIDONE 4 MG PO TABS   Oral   Take 4 mg by mouth daily.         . TRAZODONE HCL 100 MG PO TABS   Oral   Take 100 mg by mouth at bedtime.           There were no vitals taken for this visit.  Physical Exam  Nursing note and vitals reviewed. Constitutional: She is oriented to person, place, and time. She appears well-developed and well-nourished.  HENT:  Head: Normocephalic and atraumatic.  Eyes: Conjunctivae normal and EOM are normal. Pupils are equal, round, and reactive to light.  Neck: Normal range of motion. Neck supple.   Cardiovascular: Normal rate.   Pulmonary/Chest: Effort normal. No respiratory distress.  Abdominal: Soft. She exhibits no distension. There is tenderness.       Supra pubic, epigastric  Musculoskeletal: Normal range of motion. She exhibits no edema and no tenderness.  Neurological: She is alert and oriented to person, place, and time. She has normal reflexes.  Skin: Skin is warm and dry.  Psychiatric: She has a normal mood and affect.    ED Course  Procedures (including critical care time) Patient has had reglan, zofran and phenrgan in the ER.  No further vomiting since the last dose.  Lomotil pending to be given.  U/a pending.  Labs unremarkable.    Labs Reviewed  CBC  COMPREHENSIVE METABOLIC PANEL  LIPASE, BLOOD  URINALYSIS, ROUTINE W REFLEX MICROSCOPIC   No results found.   No diagnosis found.    MDM  10:30 am Gastroenteritis with n/v/d since 9pm last night.  Report given to [facility] for placement in CDU to receive IV fluids and  anti emetics.  U/a still pending.  Plan to dc home after feeling better and vomiting/diarrhea better.  Patient agrees with plan and ready to move to CDU.  She will follow up with Dr. Concepcion Elk this week.          Remi Haggard, NP 12/15/11 1032

## 2011-12-15 NOTE — ED Notes (Signed)
Pt sts she started having chest pain after continuous vomiting.  Pt sts this started last night

## 2011-12-15 NOTE — ED Provider Notes (Signed)
11:05 AM Assumed care of the patient in CDU. Patient with N/V.  Pending UA. SHe continues to retch, but no vomiting or diarrhea since heat at ED. I am giving 4mg  zofran and 1mg  ativan augmentation.   12:06 PM Neg urine preg.  + UA for UTI. Will initiate IV rocephin.  Patient more comfortable after the ativan/zofran.  Now able to sleep. Still nauseated.   2:57 PM BP 108/51  Pulse 59  Temp 98.1 F (36.7 C) (Oral)  Resp 18  SpO2 100%  LMP 06/14/2011 She's been treated for urinary tract infection.  States that she still has abdominal pain but no longer feeling nauseated.  We'll discharge her with Keflex, dicyclomine, Zofran.  Patient may followup with primary care physician.   3:47 PM Patient unable to tolerate PO fluid.  She has been given Multiple  And various antiemetics without success. I will call for admission.  C/o pain. Patient given 4  Of morphine.   I have spoken with Dr. Cena Benton who has agreed to admit  the patient  For nausea control.    Arthor Captain, PA-C 12/15/11 2357

## 2011-12-15 NOTE — Progress Notes (Signed)
ANTIBIOTIC CONSULT NOTE - INITIAL  Pharmacy Consult for ceftriaxone Indication: UTI  No Known Allergies  Patient Measurements:   Adjusted Body Weight:  Vital Signs: Temp: 98.5 F (36.9 C) (11/18 2018) Temp src: Oral (11/18 2018) BP: 138/71 mmHg (11/18 2018) Pulse Rate: 59  (11/18 2018) Intake/Output from previous day:   Intake/Output from this shift:    Labs:  Basename 12/15/11 1944 12/15/11 0735  WBC 8.9 8.5  HGB 11.9* 13.0  PLT 209 243  LABCREA -- --  CREATININE 0.82 0.92   CrCl is unknown because there is no height on file for the current visit. No results found for this basename: VANCOTROUGH:2,VANCOPEAK:2,VANCORANDOM:2,GENTTROUGH:2,GENTPEAK:2,GENTRANDOM:2,TOBRATROUGH:2,TOBRAPEAK:2,TOBRARND:2,AMIKACINPEAK:2,AMIKACINTROU:2,AMIKACIN:2, in the last 72 hours   Microbiology: No results found for this or any previous visit (from the past 720 hour(s)).  Medical History: Past Medical History  Diagnosis Date  . Arthritis   . Prolapsed disk   . Heart murmur   . Sleep deprivation   . Depression     Medications:  Scheduled:    . [COMPLETED] sodium chloride  1,000 mL Intravenous Once   Followed by  . [COMPLETED] sodium chloride  1,000 mL Intravenous Once  . benztropine  1 mg Oral Daily  . [COMPLETED] cefTRIAXone (ROCEPHIN) IVPB 1 gram/50 mL D5W  1 g Intravenous Once  . cefTRIAXone (ROCEPHIN)  IV  1 g Intravenous Q24H  . [COMPLETED] diphenhydrAMINE  12.5 mg Intravenous Once  . [COMPLETED] famotidine (PEPCID) IV  20 mg Intravenous Once  . [COMPLETED] fentaNYL  50 mcg Intravenous Once  . FLUoxetine  20 mg Oral Daily  . fluticasone  2 puff Inhalation BID  . heparin  5,000 Units Subcutaneous Q8H  . levothyroxine  75 mcg Oral Daily  . [COMPLETED] LORazepam  1 mg Intravenous Once  . [COMPLETED] metoCLOPramide (REGLAN) injection  10 mg Intravenous Once  . mirtazapine  15 mg Oral QHS  . [COMPLETED]  morphine injection  4 mg Intravenous Once  . [COMPLETED] ondansetron   4 mg Intravenous Once  . [COMPLETED] ondansetron (ZOFRAN) IV  4 mg Intravenous Once  . [COMPLETED] ondansetron (ZOFRAN) IV  4 mg Intravenous Once  . [COMPLETED] promethazine  12.5 mg Intravenous Once  . psyllium  1 packet Oral Daily  . risperidone  4 mg Oral Daily  . traZODone  100 mg Oral QHS  . [DISCONTINUED] diphenoxylate-atropine  1 tablet Oral Once  . [DISCONTINUED]  HYDROmorphone (DILAUDID) injection  1 mg Intravenous Once  . [DISCONTINUED] ondansetron (ZOFRAN) IV  4 mg Intravenous Once   Infusions:    . dextrose 5 % and 0.45 % NaCl with KCl 20 mEq/L    . [DISCONTINUED] sodium chloride 1,000 mL (12/15/11 1055)   Assessment: 46 yo female with UTI will be continued on ceftriaxone.  She received 1g of ceftriaxone at 1247 today.  Goal of Therapy:    Plan:  1) Continue ceftriaxone 1g iv q24h, next dose at 1230 on 12/16/11. 2) Pharmacy to sign off.  Analisia Kingsford, Tsz-Yin 12/15/2011,9:10 PM

## 2011-12-16 ENCOUNTER — Observation Stay (HOSPITAL_COMMUNITY): Payer: Medicare Other

## 2011-12-16 LAB — CBC
MCV: 70 fL — ABNORMAL LOW (ref 78.0–100.0)
Platelets: 188 10*3/uL (ref 150–400)
RBC: 4.8 MIL/uL (ref 3.87–5.11)
RDW: 14.6 % (ref 11.5–15.5)
WBC: 8.7 10*3/uL (ref 4.0–10.5)

## 2011-12-16 LAB — TSH: TSH: 1.776 u[IU]/mL (ref 0.350–4.500)

## 2011-12-16 LAB — BASIC METABOLIC PANEL
Calcium: 8.6 mg/dL (ref 8.4–10.5)
Creatinine, Ser: 0.87 mg/dL (ref 0.50–1.10)
GFR calc non Af Amer: 80 mL/min — ABNORMAL LOW (ref >90)
Sodium: 137 mEq/L (ref 135–145)

## 2011-12-16 MED ORDER — METOCLOPRAMIDE HCL 5 MG/ML IJ SOLN
10.0000 mg | Freq: Four times a day (QID) | INTRAMUSCULAR | Status: DC | PRN
  Administered 2011-12-16 – 2011-12-17 (×3): 10 mg via INTRAVENOUS
  Filled 2011-12-16 (×4): qty 2

## 2011-12-16 MED ORDER — IOHEXOL 300 MG/ML  SOLN
20.0000 mL | INTRAMUSCULAR | Status: AC
  Administered 2011-12-16: 20 mL via ORAL

## 2011-12-16 MED ORDER — IOHEXOL 300 MG/ML  SOLN
100.0000 mL | Freq: Once | INTRAMUSCULAR | Status: AC | PRN
  Administered 2011-12-16: 100 mL via INTRAVENOUS

## 2011-12-16 MED ORDER — PROMETHAZINE HCL 25 MG/ML IJ SOLN
12.5000 mg | Freq: Once | INTRAMUSCULAR | Status: AC
  Administered 2011-12-16: 12.5 mg via INTRAVENOUS
  Filled 2011-12-16: qty 1

## 2011-12-16 NOTE — Progress Notes (Signed)
[facility] PROGRESS NOTE  Victoria Flores WJX:[id number] DOB: 1965/01/31 DOA: 12/15/2011 PCP: Dorrene German, MD  Assessment/Plan: 1. Nausea and vomitting - At this point etiology likely related to UTI, suspect that abrubt discontinuation of SSRI's is also compounding the problem and making things worse  - Will continue supportive therapy  - Anti emetics IV, given continued nausea on zofran will add reglan 10 mg IV q 6 hours prn today 12/16/11 - Place on maintenance IVF's  - Clears, full, regular (advance diet as tolerated)  - At this point most likely due to UTI although if by tomorrow patient condition not improved would consider further imaging studies like abd kub or CT of abdomen and pelvis to rule out other causes. Abdomen soft at this juncture and will continue antibiotic regimen at this point.  2. UTI  - U/A shows positive nitrite, leukocytes, moderate hgb  - Urine culture shows > 100,000 CFU of E coli sensitivities pending.   - Will continue Ceftriaxone at this juncture.  3. Depression  - Continue home regimen once nausea and emesis improved.  - Suspect abrubt cessation of SSRI due to nausea and emesis likely contributing to # 1   4. Hypothyroidism  - TSH currently within normal limits - Continue home regimen   5. Diarrhea  - Could be related to infectious etiology no reports of recent antibiotic use prior to admission. And resolving - add metamucil and reassess.  - stool cultures sent - Patient reports last bout of diarrhea was yesterday but non reported today.   Code Status: full (although patient wishes to be DNI) Family Communication: Spoke with patient at bedside Disposition Plan: Pending improvement in nausea and emesis likely in 1-2 more days.   Consultants:  none  Procedures:  none  Antibiotics:  Rocephin  HPI/Subjective: Patient reports that she is still having suprapubic tenderness.  Still having nausea and emesis and unable to consume  clear liquid diet this am.  No other acute issues reported overnight.  Objective: Filed Vitals:   12/15/11 2115 12/16/11 0537 12/16/11 1021 12/16/11 1328  BP: 139/69 117/65 155/81 139/73  Pulse: 58 55 51 58  Temp: 98.4 F (36.9 C) 98.5 F (36.9 C) 97.7 F (36.5 C) 98.4 F (36.9 C)  TempSrc: Oral Oral Oral Oral  Resp: 20 18 22 18   Height: 5\' 2"  (1.575 m)     Weight: 73.3 kg (161 lb 9.6 oz)     SpO2: 99% 99% 98% 99%   No intake or output data in the 24 hours ending 12/16/11 1452 Filed Weights   12/15/11 2115  Weight: 73.3 kg (161 lb 9.6 oz)    Exam:   General:  Pt in Alert and O x 3, nontoxic appearing  Cardiovascular: RRR, No MRG  Respiratory: CTA BL, no wheezes  Abdomen: soft, + suprapubic tenderness   Data Reviewed: Basic Metabolic Panel:  Lab 12/16/11 2130 12/15/11 1944 12/15/11 0735  NA 137 -- 141  K 3.3* -- 3.4*  CL 106 -- 105  CO2 24 -- 23  GLUCOSE 127* -- 124*  BUN 5* -- 9  CREATININE 0.87 0.82 0.92  CALCIUM 8.6 -- 9.5  MG -- -- --  PHOS -- -- --   Liver Function Tests:  Lab 12/15/11 0735  AST 15  ALT 19  ALKPHOS 55  BILITOT 0.7  PROT 7.6  ALBUMIN 3.9    Lab 12/15/11 0735  LIPASE 23  AMYLASE --   No results found for this basename: AMMONIA:5 in  the last 168 hours CBC:  Lab 12/16/11 0802 12/15/11 1944 12/15/11 0735  WBC 8.7 8.9 8.5  NEUTROABS -- -- --  HGB 11.1* 11.9* 13.0  HCT 33.6* 35.9* 38.9  MCV 70.0* 70.7* 69.6*  PLT 188 209 243   Cardiac Enzymes: No results found for this basename: CKTOTAL:5,CKMB:5,CKMBINDEX:5,TROPONINI:5 in the last 168 hours BNP (last 3 results) No results found for this basename: PROBNP:3 in the last 8760 hours CBG: No results found for this basename: GLUCAP:5 in the last 168 hours  Recent Results (from the past 240 hour(s))  URINE CULTURE     Status: Normal (Preliminary result)   Collection Time   12/15/11 11:00 AM      Component Value Range Status Comment   Specimen Description URINE, CLEAN CATCH    Final    Special Requests NONE   Final    Culture  Setup Time 12/15/2011 12:12   Final    Colony Count >=100,000 COLONIES/ML   Final    Culture ESCHERICHIA COLI   Final    Report Status PENDING   Incomplete      Studies: No results found.  Scheduled Meds:   . benztropine  1 mg Oral Daily  . cefTRIAXone (ROCEPHIN)  IV  1 g Intravenous Q24H  . [COMPLETED] diphenhydrAMINE  12.5 mg Intravenous Once  . FLUoxetine  20 mg Oral Daily  . fluticasone  2 puff Inhalation BID  . heparin  5,000 Units Subcutaneous Q8H  . levothyroxine  75 mcg Oral Daily  . mirtazapine  15 mg Oral QHS  . [COMPLETED]  morphine injection  4 mg Intravenous Once  . [COMPLETED] ondansetron (ZOFRAN) IV  4 mg Intravenous Once  . [COMPLETED] promethazine  12.5 mg Intravenous Once  . psyllium  1 packet Oral Daily  . risperidone  4 mg Oral Daily  . traZODone  100 mg Oral QHS  . [DISCONTINUED]  HYDROmorphone (DILAUDID) injection  1 mg Intravenous Once  . [DISCONTINUED] ondansetron (ZOFRAN) IV  4 mg Intravenous Once   Continuous Infusions:   . dextrose 5 % and 0.45 % NaCl with KCl 20 mEq/L 100 mL/hr at 12/16/11 0902  . [DISCONTINUED] sodium chloride 1,000 mL (12/15/11 1055)    Active Problems:  Nausea and vomiting in adult  UTI (lower urinary tract infection)  Depression  Hypothyroidism    Time spent: > 35 minutes    Penny Pia  [facility] Pager [phone]. If 8PM-8AM, please contact night-coverage at [email or web address], password [facility] 12/16/2011, 2:52 PM  LOS: 1 day

## 2011-12-16 NOTE — Progress Notes (Signed)
Pt continued to complain of severe nausea. Patient stated she was vomiting. RN noted only scant amount of clear sputum in emesis bag. Craige Cotta NP notified. Phenergan 12.5 order received and carried out. Passed along to day shift.

## 2011-12-16 NOTE — Progress Notes (Addendum)
Pt refused all psych meds this morning.

## 2011-12-16 NOTE — ED Provider Notes (Signed)
Medical screening examination/treatment/procedure(s) were performed by non-physician practitioner and as supervising physician I was immediately available for consultation/collaboration.  Jones Skene, M.D.     Jones Skene, MD 12/16/11 1708

## 2011-12-17 LAB — URINE CULTURE: Colony Count: >=100000

## 2011-12-17 MED ORDER — ONDANSETRON 8 MG/NS 50 ML IVPB
8.0000 mg | Freq: Four times a day (QID) | INTRAVENOUS | Status: DC
  Administered 2011-12-17 – 2011-12-19 (×9): 8 mg via INTRAVENOUS
  Filled 2011-12-17 (×17): qty 8

## 2011-12-17 MED ORDER — ALUM & MAG HYDROXIDE-SIMETH 200-200-20 MG/5ML PO SUSP: 30.0000 mL | Freq: Four times a day (QID) | ORAL | Status: DC | PRN

## 2011-12-17 MED ORDER — PROMETHAZINE HCL 25 MG/ML IJ SOLN
25.0000 mg | Freq: Four times a day (QID) | INTRAMUSCULAR | Status: DC | PRN
  Administered 2011-12-17 – 2011-12-19 (×7): 25 mg via INTRAVENOUS
  Filled 2011-12-17 (×7): qty 1

## 2011-12-17 MED ORDER — PANTOPRAZOLE SODIUM 40 MG IV SOLR
40.0000 mg | Freq: Two times a day (BID) | INTRAVENOUS | Status: DC
  Administered 2011-12-17 – 2011-12-18 (×3): 40 mg via INTRAVENOUS
  Filled 2011-12-17 (×4): qty 40

## 2011-12-17 NOTE — Progress Notes (Signed)
[facility] PROGRESS NOTE  MYSHA PEELER ZOX:[id number] DOB: 09/14/1965 DOA: 12/15/2011 PCP: Dorrene German, MD  Assessment/Plan: Nausea and vomiting and diarrhea.  Patient states that she has had exposure to infants with similar symptoms recently, although may be related to UTI.  She had small right kidney stone 3mm that was nonobstructing and possible mild colitis apparent on CT scan from yesterday.  Still having severe nausea and inability to eat and had watery diarrhea overnight.   -  Schedule zofran 8mg  IV q6h -  Phenergan and reglan prn  -  Add protonix and maalox -  Clears and advance diet as tolerated  2. E. Coli UTI, sensitive to ceftriaxone - Continue Ceftriaxone  3. Depression  - Continue home regimen once nausea and emesis improved.  - Patient states that she ran out of her home medications a month ago  4. Hypothyroidism  - TSH currently within normal limits - Continue home regimen   5. Diarrhea, states she had an episode of diarrhea last night while vomiting.  Exposure to infants with GI illness - Could be related to infectious etiology no reports of recent antibiotic use prior to admission -  C diff and stool  - add metamucil and reassess.  - stool culture has not been sent yest  6.  Kidney stone: -  Likely asymptomatic and not obstructing.  Patient's abdominal pain is lower abdomen/suprapubic, and soft, whereas stone is in the lower pole of the right kidney.   -  Aggressive hydration  7. Hypokalemia:  LIkely due to GI losses -  Check potassium in AM -  Continue IVF with potassium  8.  Mood disorder/depression -  Continue cogentin and risperdone -  Continue fluoxetine  9.  Microcytic anemia, stable hemoglobin -  Defer to PCP.   DIET:  Clear liquid diet, advance as tolerated ACCESS:  PIV IVF:  D5 1/2NS and 20KCL at 158ml/h PROPH:  heparin  Code Status: full (although patient wishes to be DNI) Family Communication: Spoke with patient at  bedside Disposition Plan: Pending improvement in nausea and emesis likely in 1-2 more days.   Consultants:  none  Procedures:  none  Antibiotics:  Rocephin  HPI/Subjective: Still having nausea and emesis with watery BM overnight, and unable to consume clear liquid diet again today.  Feels like she is having heart burn.     Objective: Filed Vitals:   12/16/11 1802 12/16/11 2130 12/16/11 2211 12/17/11 0550  BP: 144/84 145/61  143/66  Pulse: 74 62  63  Temp: 99 F (37.2 C) 98.3 F (36.8 C)  98.4 F (36.9 C)  TempSrc: Oral     Resp: 18 18  18   Height:      Weight:      SpO2: 100% 100% 100% 98%   No intake or output data in the 24 hours ending 12/17/11 1254 Filed Weights   12/15/11 2115  Weight: 73.3 kg (161 lb 9.6 oz)    Exam:   General:  AAF, no acute distress  Cardiovascular: RRR, No MRG  Respiratory: CTAB  Abdomen:  NABS, soft, nondistended, mild tenderness diffusely without rebound or guarding.  Feels sick to stomach when palpating abdomen.     MSK:  No LEE  Data Reviewed: Basic Metabolic Panel:  Lab 12/16/11 8119 12/15/11 1944 12/15/11 0735  NA 137 -- 141  K 3.3* -- 3.4*  CL 106 -- 105  CO2 24 -- 23  GLUCOSE 127* -- 124*  BUN 5* -- 9  CREATININE  0.87 0.82 0.92  CALCIUM 8.6 -- 9.5  MG -- -- --  PHOS -- -- --   Liver Function Tests:  Lab 12/15/11 0735  AST 15  ALT 19  ALKPHOS 55  BILITOT 0.7  PROT 7.6  ALBUMIN 3.9    Lab 12/15/11 0735  LIPASE 23  AMYLASE --   No results found for this basename: AMMONIA:5 in the last 168 hours CBC:  Lab 12/16/11 0802 12/15/11 1944 12/15/11 0735  WBC 8.7 8.9 8.5  NEUTROABS -- -- --  HGB 11.1* 11.9* 13.0  HCT 33.6* 35.9* 38.9  MCV 70.0* 70.7* 69.6*  PLT 188 209 243   Cardiac Enzymes: No results found for this basename: CKTOTAL:5,CKMB:5,CKMBINDEX:5,TROPONINI:5 in the last 168 hours BNP (last 3 results) No results found for this basename: PROBNP:3 in the last 8760 hours CBG: No results  found for this basename: GLUCAP:5 in the last 168 hours  Recent Results (from the past 240 hour(s))  URINE CULTURE     Status: Normal   Collection Time   12/15/11 11:00 AM      Component Value Range Status Comment   Specimen Description URINE, CLEAN CATCH   Final    Special Requests NONE   Final    Culture  Setup Time 12/15/2011 12:12   Final    Colony Count >=100,000 COLONIES/ML   Final    Culture ESCHERICHIA COLI   Final    Report Status 12/17/2011 FINAL   Final    Organism ID, Bacteria ESCHERICHIA COLI   Final      Studies: Ct Abdomen Pelvis W Contrast  12/17/2011  *RADIOLOGY REPORT*  Clinical Data: Abdominal discomfort and nausea.  Nausea vomiting. History of leukemia.  CT ABDOMEN AND PELVIS WITH CONTRAST  Technique:  Multidetector CT imaging of the abdomen and pelvis was performed following the standard protocol during bolus administration of intravenous contrast.  Contrast: OMNIPAQUE IOHEXOL 300 MG/ML  SOLN  Comparison: None.  Findings: Lung bases:  Minimal subsegmental atelectasis at the lung bases. Normal heart size without pericardial or pleural effusion.  Abdomen/pelvis:  Probable mild hepatic steatosis. No focal liver lesion.  Normal spleen, stomach, pancreas, gallbladder, biliary tract, adrenal glands.  3 mm lower pole right renal nonobstructive calculus.  Tiny interpolar left renal lesion which is too small to characterize.  No retroperitoneal or retrocrural adenopathy.  Portions of the colon appear mildly thickened.  Felt to be secondary to underdistension. Normal terminal ileum.  Normal appendix; image 60/series 6. Normal small bowel without abdominal ascites.    No pelvic adenopathy.  Normal urinary bladder.  Numerous enhancing uterine lesions, most consistent with fibroids.  The largest measures 2.9 cm within the left side of the fundus.  No adnexal mass. Trace free pelvic fluid is likely physiologic.  Bones/Musculoskeletal:  No acute osseous abnormality.  Moderate to large  disc bulge at the L4-L5 level.  IMPRESSION:  1. No acute process in the abdomen or pelvis. 2.  Multifocal areas of colonic underdistension.  Apparent wall thickening is felt to be secondary.  Mild colitis cannot be entirely excluded. 3.  Uterine fibroids. 4.  Probable mild hepatic steatosis. 5.  Right renal calculus.   Original Report Authenticated By: Jeronimo Greaves, M.D.     Scheduled Meds:    . benztropine  1 mg Oral Daily  . cefTRIAXone (ROCEPHIN)  IV  1 g Intravenous Q24H  . FLUoxetine  20 mg Oral Daily  . fluticasone  2 puff Inhalation BID  . heparin  5,000  Units Subcutaneous Q8H  . [EXPIRED] iohexol  20 mL Oral Q1 Hr x 2  . levothyroxine  75 mcg Oral Daily  . mirtazapine  15 mg Oral QHS  . psyllium  1 packet Oral Daily  . risperidone  4 mg Oral Daily  . traZODone  100 mg Oral QHS   Continuous Infusions:    . dextrose 5 % and 0.45 % NaCl with KCl 20 mEq/L 100 mL/hr at 12/17/11 4259    Active Problems:  Nausea and vomiting in adult  UTI (lower urinary tract infection)  Depression  Hypothyroidism    Time spent: 30 minutes    Azad Calame, [facility]  [facility] Pager [phone]. If 8PM-8AM, please contact night-coverage at [email or web address], password [facility] 12/17/2011, 12:54 PM  LOS: 2 days

## 2011-12-18 LAB — BASIC METABOLIC PANEL
CO2: 24 mEq/L (ref 19–32)
Calcium: 9 mg/dL (ref 8.4–10.5)
Chloride: 102 mEq/L (ref 96–112)
Creatinine, Ser: 0.94 mg/dL (ref 0.50–1.10)
GFR calc Af Amer: 84 mL/min — ABNORMAL LOW (ref >90)
Sodium: 137 mEq/L (ref 135–145)

## 2011-12-18 MED ORDER — POTASSIUM CHLORIDE CRYS ER 20 MEQ PO TBCR
60.0000 meq | EXTENDED_RELEASE_TABLET | Freq: Once | ORAL | Status: AC
  Administered 2011-12-18: 60 meq via ORAL
  Filled 2011-12-18: qty 3

## 2011-12-18 MED ORDER — PANTOPRAZOLE SODIUM 40 MG PO TBEC
40.0000 mg | DELAYED_RELEASE_TABLET | Freq: Two times a day (BID) | ORAL | Status: DC
  Administered 2011-12-18 – 2011-12-19 (×2): 40 mg via ORAL
  Filled 2011-12-18 (×2): qty 1

## 2011-12-18 NOTE — Progress Notes (Signed)
[facility] PROGRESS NOTE  Victoria Flores ZOX:[id number] DOB: 03-27-1965 DOA: 12/15/2011 PCP: Dorrene German, MD  Assessment/Plan: Nausea and vomiting and diarrhea.  Patient states that she has had exposure to infants with similar symptoms recently, although may be related to UTI.  She had small right kidney stone 3mm that was nonobstructing and possible mild colitis apparent on CT scan from yesterday.  Nausea is improving, but she is still only able to tolerate sips of fluids.   -  Continue zofran 8mg  IV q6h -  Phenergan and reglan prn  -  Continue protonix and maalox -  Advance to full liquid diet and advance diet as tolerated  2. E. Coli UTI, sensitive to ceftriaxone - Continue Ceftriaxone, day 4  3. Depression  - Continue home regimen once nausea and emesis improved.  - Patient states that she ran out of her home medications a month ago  4. Hypothyroidism  - TSH currently within normal limits - Continue home regimen   5. Diarrhea, last 2 nights ago.  Exposure to infants with GI illness.  Not likely C dif as no diarrhea in several days.   - Could be related to infectious etiology no reports of recent antibiotic use prior to admission - add metamucil and reassess.   6.  Kidney stone: -  Likely asymptomatic and not obstructing.  Patient's abdominal pain is lower abdomen/suprapubic, and soft, whereas stone is in the lower pole of the right kidney.    7. Hypokalemia:  LIkely due to GI losses -  Oral potassium this morning -  Continue IVF with potassium  8.  Mood disorder/depression -  Continue cogentin and risperdone -  Continue fluoxetine  9.  Microcytic anemia, stable hemoglobin -  Defer to PCP.   DIET:  Full liquid diet, advance as tolerated ACCESS:  PIV IVF:  D5 1/2NS and 20KCL at 59ml/h PROPH:  heparin  Code Status: full (although patient wishes to be DNI) Family Communication: Spoke with patient at bedside Disposition Plan: Pending able to tolerate liquids,  likely tomorrow.   Consultants:  none  Procedures:  none  Antibiotics:  Rocephin day 4  HPI/Subjective: Still having nausea but much better since yesterday.  Has gotten a few sips of liquids today ( ) per flow sheet.  Denies diarrhea since two days ago.  Denies chest pain, shortness of breath, lower extremity edema.  Abdominal discomfort also improved.      Objective: Filed Vitals:   12/17/11 1413 12/17/11 2226 12/18/11 0619 12/18/11 1339  BP: 154/76 146/81 140/69 141/74  Pulse: 62 75 77 90  Temp: 99.3 F (37.4 C) 99 F (37.2 C) 98.7 F (37.1 C) 99 F (37.2 C)  TempSrc: Oral   Oral  Resp: 15 18 19 18   Height:      Weight:      SpO2: 98% 100% 100% 100%    Intake/Output Summary (Last 24 hours) at 12/18/11 1429 Last data filed at 12/18/11 1300  Gross per 24 hour  Intake    120 ml  Output      0 ml  Net    120 ml   Filed Weights   12/15/11 2115  Weight: 73.3 kg (161 lb 9.6 oz)    Exam:   General:  AAF, no acute distress  Cardiovascular: RRR, No MRG  Respiratory: CTAB  Abdomen:  NABS, soft, nondistended,  nontender and no nausea with palpation today.     MSK:  No LEE  Data Reviewed: Basic Metabolic Panel:  Lab 12/18/11 0555 12/16/11 0802 12/15/11 1944 12/15/11 0735  NA 137 137 -- 141  K 3.2* 3.3* -- 3.4*  CL 102 106 -- 105  CO2 24 24 -- 23  GLUCOSE 110* 127* -- 124*  BUN 3* 5* -- 9  CREATININE 0.94 0.87 0.82 0.92  CALCIUM 9.0 8.6 -- 9.5  MG -- -- -- --  PHOS -- -- -- --   Liver Function Tests:  Lab 12/15/11 0735  AST 15  ALT 19  ALKPHOS 55  BILITOT 0.7  PROT 7.6  ALBUMIN 3.9    Lab 12/15/11 0735  LIPASE 23  AMYLASE --   No results found for this basename: AMMONIA:5 in the last 168 hours CBC:  Lab 12/16/11 0802 12/15/11 1944 12/15/11 0735  WBC 8.7 8.9 8.5  NEUTROABS -- -- --  HGB 11.1* 11.9* 13.0  HCT 33.6* 35.9* 38.9  MCV 70.0* 70.7* 69.6*  PLT 188 209 243   Cardiac Enzymes: No results found for this basename:  CKTOTAL:5,CKMB:5,CKMBINDEX:5,TROPONINI:5 in the last 168 hours BNP (last 3 results) No results found for this basename: PROBNP:3 in the last 8760 hours CBG: No results found for this basename: GLUCAP:5 in the last 168 hours  Recent Results (from the past 240 hour(s))  URINE CULTURE     Status: Normal   Collection Time   12/15/11 11:00 AM      Component Value Range Status Comment   Specimen Description URINE, CLEAN CATCH   Final    Special Requests NONE   Final    Culture  Setup Time 12/15/2011 12:12   Final    Colony Count >=100,000 COLONIES/ML   Final    Culture ESCHERICHIA COLI   Final    Report Status 12/17/2011 FINAL   Final    Organism ID, Bacteria ESCHERICHIA COLI   Final      Studies: Ct Abdomen Pelvis W Contrast  12/17/2011  *RADIOLOGY REPORT*  Clinical Data: Abdominal discomfort and nausea.  Nausea vomiting. History of leukemia.  CT ABDOMEN AND PELVIS WITH CONTRAST  Technique:  Multidetector CT imaging of the abdomen and pelvis was performed following the standard protocol during bolus administration of intravenous contrast.  Contrast: OMNIPAQUE IOHEXOL 300 MG/ML  SOLN  Comparison: None.  Findings: Lung bases:  Minimal subsegmental atelectasis at the lung bases. Normal heart size without pericardial or pleural effusion.  Abdomen/pelvis:  Probable mild hepatic steatosis. No focal liver lesion.  Normal spleen, stomach, pancreas, gallbladder, biliary tract, adrenal glands.  3 mm lower pole right renal nonobstructive calculus.  Tiny interpolar left renal lesion which is too small to characterize.  No retroperitoneal or retrocrural adenopathy.  Portions of the colon appear mildly thickened.  Felt to be secondary to underdistension. Normal terminal ileum.  Normal appendix; image 60/series 6. Normal small bowel without abdominal ascites.    No pelvic adenopathy.  Normal urinary bladder.  Numerous enhancing uterine lesions, most consistent with fibroids.  The largest measures 2.9 cm  within the left side of the fundus.  No adnexal mass. Trace free pelvic fluid is likely physiologic.  Bones/Musculoskeletal:  No acute osseous abnormality.  Moderate to large disc bulge at the L4-L5 level.  IMPRESSION:  1. No acute process in the abdomen or pelvis. 2.  Multifocal areas of colonic underdistension.  Apparent wall thickening is felt to be secondary.  Mild colitis cannot be entirely excluded. 3.  Uterine fibroids. 4.  Probable mild hepatic steatosis. 5.  Right renal calculus.   Original Report Authenticated By:  Jeronimo Greaves, M.D.     Scheduled Meds:    . benztropine  1 mg Oral Daily  . cefTRIAXone (ROCEPHIN)  IV  1 g Intravenous Q24H  . FLUoxetine  20 mg Oral Daily  . fluticasone  2 puff Inhalation BID  . heparin  5,000 Units Subcutaneous Q8H  . levothyroxine  75 mcg Oral Daily  . mirtazapine  15 mg Oral QHS  . ondansetron (ZOFRAN) IV  8 mg Intravenous Q6H  . pantoprazole (PROTONIX) IV  40 mg Intravenous Q12H  . [COMPLETED] potassium chloride  60 mEq Oral Once  . psyllium  1 packet Oral Daily  . risperidone  4 mg Oral Daily  . traZODone  100 mg Oral QHS   Continuous Infusions:    . dextrose 5 % and 0.45 % NaCl with KCl 20 mEq/L 1,000 mL (12/18/11 1043)    Active Problems:  Nausea and vomiting in adult  UTI (lower urinary tract infection)  Depression  Hypothyroidism    Time spent: 30 minutes    Karon Heckendorn, [facility]  [facility] Pager [phone]. If 8PM-8AM, please contact night-coverage at [email or web address], password [facility] 12/18/2011, 2:29 PM  LOS: 3 days

## 2011-12-18 NOTE — Discharge Summary (Signed)
Physician Discharge Summary  Victoria Flores:[id number] DOB: 19-Oct-1965 DOA: 12/15/2011  PCP: Dorrene German, MD  Admit date: 12/15/2011 Discharge date: 12/19/2011  Recommendations for Outpatient Follow-up:  1. Recommend patient follows up with gastroenterology for further assessment if her nausea persists.    2. Follow up with primary care doctor for blood pressure check to make sure BP trends down after resolution of acute illness.    Discharge Diagnoses:  Active Problems:  Nausea and vomiting in adult  UTI (lower urinary tract infection)  Depression  Hypothyroidism   Discharge Condition: stable, improved  Diet recommendation:   Regular diet  Wt Readings from Last 3 Encounters:  12/15/11 73.3 kg (161 lb 9.6 oz)    History of present illness:   That presents with 5 day complaint of nonbloody diarrhea. States that this occurred after eating at a [place] [employer] (she ate chicken and a spoonful of coleslaw). Reports that her brother had diarrhea that night as well. Reportedly developed nausea yesterday evening. Has had difficulty keeping anything down. Nausea is worse any time she tries to consume or drink fluids. The antiemetics in the ED have helped. Patient is unsure how may times she has had emesis over the day. Has subsequently developed chest discomfort. Denies any bloody emesis. Also endorses polyuria with no complaints of any dysuria.   In ED patient was given antiemetics with minimal relief and was unable to tolerate po intake as such we were consulted for further evaluation and recommendations. U/A showed Hazy urine with positive nitrite, small leukocytes, moderate hbg, and many bacteria.   Hospital Course:   Nausea and vomiting and diarrhea. Patient stated that she has had exposure to infants with similar symptoms recently, although symptoms may have been related to UTI.  Her urine pregnancy test was negative.  CT demonstrated a small right kidney stone 3mm that  was nonobstructing and possible mild colitis.  Her diarrhea improved with metamucil.  Her nausea gradually improved after scheduling around the clock zofran and adding phenergan as needed.  She gradually was able to drink fluids prior to discharge and her nausea was improved but not resolved.  She may continue zofran and phenergan at home.    E. Coli UTI, sensitive to ceftriaxone.  She completed a 5-day course of ceftriaxone.    Depression.  Her home regimen was restarted once her nausea and emesis improved.  She was given new prescriptions for her medications because she had run out a month ago.  Monitor for serotonin syndrome, NMS with combination of fluoxetine and mirtazapine.  Patient is on moderate doses of each.    Hypothyroidism.  TSH currently within normal limits and she continued her synthroid at previous dose.    Kidney stone:  Likely asymptomatic and not obstructing. Patient's abdominal pain was lower abdomen/suprapubic, and soft, whereas stone is in the lower pole of the right kidney.  Encouraged her to drink plenty of fluids.    Hypokalemia:  Was likely due to GI losses and was supplemented with IVF with potassium and oral potassium and resolved.    Microcytic anemia, stable hemoglobin.  Defer evaluation and management to PCP if not already done.    Blood pressures were mildly elevated in the setting of nausea and vomiting.  Recommend outpatient follow up.    Consultants:  none Procedures:  none Antibiotics:  Rocephin x 5 days  Discharge Exam: Filed Vitals:   12/19/11 0647  BP: 131/35  Pulse: 94  Temp: 99.1 F (37.3 C)  Resp: 18   Filed Vitals:   12/18/11 1339 12/18/11 2222 12/19/11 0647 12/19/11 0922  BP: 141/74 142/77 131/35   Pulse: 90 85 94   Temp: 99 F (37.2 C) 98.9 F (37.2 C) 99.1 F (37.3 C)   TempSrc: Oral     Resp: 18 18 18    Height:      Weight:      SpO2: 100% 100% 100% 98%    General: AAF, no acute distress  Cardiovascular: RRR, No MRG    Respiratory: CTAB  Abdomen: NABS, soft, nondistended, nontender and mild nausea with palpation today.  MSK: No LEE, 2+ pulses  Discharge Instructions      Discharge Orders    Future Orders Please Complete By Expires   Diet general      Increase activity slowly      Discharge instructions      Comments:   You were hospitalized with nausea, vomiting, and diarrhea.  You were found to have gastroenteritis and a urinary tract infection.  You also had a small kidney stone on the right side.  Your pregnancy test was negative and we were unable to collect diarrhea to test for infection because it stopped early on.  You were given zofran and phenergan for nausea.  You should also take protonix and ranitidine for acid suppression.  You completed a course of antibiotics for urinary tract infection while in the hospital.  Please follow up with your primary care doctor early next week, particularly if your symptoms do not continue to improve.  Continue to drink plenty of fluids.  You need to drink at least 2 liters daily, particularly if you continue to vomit.   Call MD for:  temperature >100.4      Call MD for:  persistant nausea and vomiting      Call MD for:  severe uncontrolled pain      Call MD for:  difficulty breathing, headache or visual disturbances      Call MD for:  persistant dizziness or light-headedness      Call MD for:  hives      Call MD for:  extreme fatigue          Medication List     As of 12/19/2011  2:43 PM    TAKE these medications         beclomethasone 40 MCG/ACT inhaler   Commonly known as: QVAR   Inhale 2 puffs into the lungs 2 (two) times daily.      benztropine 1 MG tablet   Commonly known as: COGENTIN   Take 1 tablet (1 mg total) by mouth daily.      FLUoxetine 20 MG capsule   Commonly known as: PROZAC   Take 1 capsule (20 mg total) by mouth daily.      levothyroxine 75 MCG tablet   Commonly known as: SYNTHROID, LEVOTHROID   Take 1 tablet (75 mcg  total) by mouth daily.      mirtazapine 15 MG tablet   Commonly known as: REMERON   Take 1 tablet (15 mg total) by mouth at bedtime.      ondansetron 8 MG tablet   Commonly known as: ZOFRAN   Take 1 tablet (8 mg total) by mouth every 8 (eight) hours as needed for nausea.      oxyCODONE-acetaminophen 10-325 MG per tablet   Commonly known as: PERCOCET   Take 1 tablet by mouth 3 (three) times daily.      pantoprazole  40 MG tablet   Commonly known as: PROTONIX   Take 1 tablet (40 mg total) by mouth daily.      promethazine 25 MG tablet   Commonly known as: PHENERGAN   Take 1 tablet (25 mg total) by mouth every 6 (six) hours as needed for nausea.      psyllium 95 % Pack   Commonly known as: HYDROCIL/METAMUCIL   Take 1 packet by mouth daily.      ranitidine 150 MG tablet   Commonly known as: ZANTAC   Take 2 tablets (300 mg total) by mouth daily.      risperidone 4 MG tablet   Commonly known as: RISPERDAL   Take 1 tablet (4 mg total) by mouth daily.      traZODone 100 MG tablet   Commonly known as: DESYREL   Take 1 tablet (100 mg total) by mouth at bedtime.        Follow-up Information    Call AVBUERE,EDWIN A, MD. (follow up N/V and possible GI referral if not continuing to improve)    Contact information:   [address] [place] [address] [phone]           The results of significant diagnostics from this hospitalization (including imaging, microbiology, ancillary and laboratory) are listed below for reference.    Significant Diagnostic Studies: Ct Abdomen Pelvis W Contrast  12/17/2011  *RADIOLOGY REPORT*  Clinical Data: Abdominal discomfort and nausea.  Nausea vomiting. History of leukemia.  CT ABDOMEN AND PELVIS WITH CONTRAST  Technique:  Multidetector CT imaging of the abdomen and pelvis was performed following the standard protocol during bolus administration of intravenous contrast.  Contrast: OMNIPAQUE IOHEXOL 300 MG/ML  SOLN  Comparison:  None.  Findings: Lung bases:  Minimal subsegmental atelectasis at the lung bases. Normal heart size without pericardial or pleural effusion.  Abdomen/pelvis:  Probable mild hepatic steatosis. No focal liver lesion.  Normal spleen, stomach, pancreas, gallbladder, biliary tract, adrenal glands.  3 mm lower pole right renal nonobstructive calculus.  Tiny interpolar left renal lesion which is too small to characterize.  No retroperitoneal or retrocrural adenopathy.  Portions of the colon appear mildly thickened.  Felt to be secondary to underdistension. Normal terminal ileum.  Normal appendix; image 60/series 6. Normal small bowel without abdominal ascites.    No pelvic adenopathy.  Normal urinary bladder.  Numerous enhancing uterine lesions, most consistent with fibroids.  The largest measures 2.9 cm within the left side of the fundus.  No adnexal mass. Trace free pelvic fluid is likely physiologic.  Bones/Musculoskeletal:  No acute osseous abnormality.  Moderate to large disc bulge at the L4-L5 level.  IMPRESSION:  1. No acute process in the abdomen or pelvis. 2.  Multifocal areas of colonic underdistension.  Apparent wall thickening is felt to be secondary.  Mild colitis cannot be entirely excluded. 3.  Uterine fibroids. 4.  Probable mild hepatic steatosis. 5.  Right renal calculus.   Original Report Authenticated By: Jeronimo Greaves, M.D.     Microbiology: Recent Results (from the past 240 hour(s))  URINE CULTURE     Status: Normal   Collection Time   12/15/11 11:00 AM      Component Value Range Status Comment   Specimen Description URINE, CLEAN CATCH   Final    Special Requests NONE   Final    Culture  Setup Time 12/15/2011 12:12   Final    Colony Count >=100,000 COLONIES/ML   Final    Culture  ESCHERICHIA COLI   Final    Report Status 12/17/2011 FINAL   Final    Organism ID, Bacteria ESCHERICHIA COLI   Final      Labs: Basic Metabolic Panel:  Lab 12/19/11 2130 12/18/11 0555 12/16/11 0802 12/15/11  1944 12/15/11 0735  NA 136 137 137 -- 141  K 3.6 3.2* 3.3* -- 3.4*  CL 101 102 106 -- 105  CO2 24 24 24  -- 23  GLUCOSE 107* 110* 127* -- 124*  BUN 6 3* 5* -- 9  CREATININE 1.04 0.94 0.87 0.82 0.92  CALCIUM 9.3 9.0 8.6 -- 9.5  MG -- -- -- -- --  PHOS -- -- -- -- --   Liver Function Tests:  Lab 12/15/11 0735  AST 15  ALT 19  ALKPHOS 55  BILITOT 0.7  PROT 7.6  ALBUMIN 3.9    Lab 12/15/11 0735  LIPASE 23  AMYLASE --   No results found for this basename: AMMONIA:5 in the last 168 hours CBC:  Lab 12/16/11 0802 12/15/11 1944 12/15/11 0735  WBC 8.7 8.9 8.5  NEUTROABS -- -- --  HGB 11.1* 11.9* 13.0  HCT 33.6* 35.9* 38.9  MCV 70.0* 70.7* 69.6*  PLT 188 209 243   Cardiac Enzymes: No results found for this basename: CKTOTAL:5,CKMB:5,CKMBINDEX:5,TROPONINI:5 in the last 168 hours BNP: BNP (last 3 results) No results found for this basename: PROBNP:3 in the last 8760 hours CBG: No results found for this basename: GLUCAP:5 in the last 168 hours  Time coordinating discharge: 45 minutes  Signed:  Renae Fickle  [facility] 12/19/2011, 2:43 PM

## 2011-12-19 LAB — BASIC METABOLIC PANEL
CO2: 24 mEq/L (ref 19–32)
Calcium: 9.3 mg/dL (ref 8.4–10.5)
Creatinine, Ser: 1.04 mg/dL (ref 0.50–1.10)
Glucose, Bld: 107 mg/dL — ABNORMAL HIGH (ref 70–99)

## 2011-12-19 MED ORDER — PSYLLIUM 95 % PO PACK: 1.0000 | PACK | Freq: Every day | ORAL | Status: DC

## 2011-12-19 MED ORDER — FLUOXETINE HCL 20 MG PO CAPS: 20.0000 mg | ORAL_CAPSULE | Freq: Every day | ORAL | Status: DC

## 2011-12-19 MED ORDER — PANTOPRAZOLE SODIUM 40 MG PO TBEC: 40.0000 mg | DELAYED_RELEASE_TABLET | Freq: Every day | ORAL | Status: DC

## 2011-12-19 MED ORDER — TRAZODONE HCL 100 MG PO TABS: 100.0000 mg | ORAL_TABLET | Freq: Every day | ORAL | Status: DC

## 2011-12-19 MED ORDER — BENZTROPINE MESYLATE 1 MG PO TABS: 1.0000 mg | ORAL_TABLET | Freq: Every day | ORAL | Status: DC

## 2011-12-19 MED ORDER — BECLOMETHASONE DIPROPIONATE 40 MCG/ACT IN AERS: 2.0000 | INHALATION_SPRAY | Freq: Two times a day (BID) | RESPIRATORY_TRACT | Status: DC

## 2011-12-19 MED ORDER — ONDANSETRON HCL 8 MG PO TABS: 8.0000 mg | ORAL_TABLET | Freq: Three times a day (TID) | ORAL | Status: DC | PRN

## 2011-12-19 MED ORDER — PROMETHAZINE HCL 25 MG PO TABS: 25.0000 mg | ORAL_TABLET | Freq: Four times a day (QID) | ORAL | Status: DC | PRN

## 2011-12-19 MED ORDER — OXYCODONE-ACETAMINOPHEN 10-325 MG PO TABS: 1.0000 | ORAL_TABLET | Freq: Three times a day (TID) | ORAL | Status: DC

## 2011-12-19 MED ORDER — MIRTAZAPINE 15 MG PO TABS: 15.0000 mg | ORAL_TABLET | Freq: Every day | ORAL | Status: DC

## 2011-12-19 MED ORDER — LEVOTHYROXINE SODIUM 75 MCG PO TABS: 75.0000 ug | ORAL_TABLET | Freq: Every day | ORAL | Status: AC

## 2011-12-19 MED ORDER — RANITIDINE HCL 150 MG PO TABS: 300.0000 mg | ORAL_TABLET | Freq: Every day | ORAL | Status: DC

## 2011-12-19 MED ORDER — RISPERIDONE 4 MG PO TABS: 4.0000 mg | ORAL_TABLET | Freq: Every day | ORAL | Status: DC

## 2011-12-19 NOTE — Progress Notes (Signed)
PT Cancellation Note/Discontinue Note  Patient Details Name: Victoria Flores MRN: [id number] DOB: [birth year]   Cancelled Treatment:    Reason Eval/Treat Not Completed: Other (comment) (Pt. is fully I in room, about to shower.)  Pt. Reports she couldn't walk when she arrived at hospital, but has returned to her baseline mobilty status. She is observed up in room carrying linens,clothing in to bathroom for a shower.  Appears stable with no loss of balance.  States "I don't need PT".  PT eval not indicated at this time.  Will sign off.    Ferman Hamming 12/19/2011, 11:51 AM Weldon Picking PT [facility] [phone] [facility] [phone]

## 2011-12-20 ENCOUNTER — Emergency Department (HOSPITAL_COMMUNITY)
Admission: EM | Admit: 2011-12-20 | Discharge: 2011-12-21 | Disposition: A | Discharge status: Home / Self Care | Payer: Medicare Other | Attending: Emergency Medicine | Admitting: Emergency Medicine

## 2011-12-20 ENCOUNTER — Encounter (HOSPITAL_COMMUNITY): Payer: Self-pay | Admitting: *Deleted

## 2011-12-20 DIAGNOSIS — F3289 Other specified depressive episodes: Secondary | ICD-10-CM | POA: Insufficient documentation

## 2011-12-20 DIAGNOSIS — Z72820 Sleep deprivation: Secondary | ICD-10-CM | POA: Insufficient documentation

## 2011-12-20 DIAGNOSIS — Z79899 Other long term (current) drug therapy: Secondary | ICD-10-CM | POA: Insufficient documentation

## 2011-12-20 DIAGNOSIS — Z8739 Personal history of other diseases of the musculoskeletal system and connective tissue: Secondary | ICD-10-CM | POA: Insufficient documentation

## 2011-12-20 DIAGNOSIS — Z8679 Personal history of other diseases of the circulatory system: Secondary | ICD-10-CM | POA: Insufficient documentation

## 2011-12-20 DIAGNOSIS — M129 Arthropathy, unspecified: Secondary | ICD-10-CM | POA: Insufficient documentation

## 2011-12-20 DIAGNOSIS — Z87891 Personal history of nicotine dependence: Secondary | ICD-10-CM | POA: Insufficient documentation

## 2011-12-20 DIAGNOSIS — F329 Major depressive disorder, single episode, unspecified: Secondary | ICD-10-CM | POA: Insufficient documentation

## 2011-12-20 DIAGNOSIS — R112 Nausea with vomiting, unspecified: Secondary | ICD-10-CM | POA: Insufficient documentation

## 2011-12-20 LAB — CBC WITH DIFFERENTIAL/PLATELET
Basophils Relative: 0 % (ref 0–1)
Eosinophils Absolute: 0.4 10*3/uL (ref 0.0–0.7)
Eosinophils Relative: 4 % (ref 0–5)
HCT: 44.4 % (ref 36.0–46.0)
Hemoglobin: 15.1 g/dL — ABNORMAL HIGH (ref 12.0–15.0)
Lymphocytes Relative: 17 % (ref 12–46)
MCHC: 34 g/dL (ref 30.0–36.0)
Neutro Abs: 6.9 10*3/uL (ref 1.7–7.7)
RBC: 6.34 MIL/uL — ABNORMAL HIGH (ref 3.87–5.11)

## 2011-12-20 LAB — URINALYSIS, ROUTINE W REFLEX MICROSCOPIC
Glucose, UA: NEGATIVE mg/dL
Ketones, ur: 40 mg/dL — AB
Protein, ur: NEGATIVE mg/dL
pH: 6 (ref 5.0–8.0)

## 2011-12-20 LAB — PREGNANCY, URINE: Preg Test, Ur: NEGATIVE

## 2011-12-20 LAB — COMPREHENSIVE METABOLIC PANEL
ALT: 18 U/L (ref 0–35)
AST: 16 U/L (ref 0–37)
Alkaline Phosphatase: 61 U/L (ref 39–117)
CO2: 24 mEq/L (ref 19–32)
Calcium: 10.2 mg/dL (ref 8.4–10.5)
Chloride: 100 mEq/L (ref 96–112)
GFR calc Af Amer: 66 mL/min — ABNORMAL LOW (ref >90)
GFR calc non Af Amer: 57 mL/min — ABNORMAL LOW (ref >90)
Glucose, Bld: 97 mg/dL (ref 70–99)
Potassium: 4 mEq/L (ref 3.5–5.1)
Sodium: 137 mEq/L (ref 135–145)
Total Bilirubin: 0.5 mg/dL (ref 0.3–1.2)

## 2011-12-20 MED ORDER — PANTOPRAZOLE SODIUM 40 MG IV SOLR
40.0000 mg | Freq: Once | INTRAVENOUS | Status: AC
  Administered 2011-12-20: 40 mg via INTRAVENOUS
  Filled 2011-12-20: qty 40

## 2011-12-20 MED ORDER — SODIUM CHLORIDE 0.9 % IV BOLUS (SEPSIS)
1000.0000 mL | Freq: Once | INTRAVENOUS | Status: AC
  Administered 2011-12-20: 1000 mL via INTRAVENOUS

## 2011-12-20 MED ORDER — MORPHINE SULFATE 4 MG/ML IJ SOLN
4.0000 mg | Freq: Once | INTRAMUSCULAR | Status: AC
  Administered 2011-12-20: 4 mg via INTRAVENOUS
  Filled 2011-12-20: qty 1

## 2011-12-20 MED ORDER — ONDANSETRON HCL 4 MG/2ML IJ SOLN
4.0000 mg | Freq: Once | INTRAMUSCULAR | Status: AC
  Administered 2011-12-20: 4 mg via INTRAVENOUS
  Filled 2011-12-20: qty 2

## 2011-12-20 NOTE — ED Provider Notes (Signed)
History     CSN: [id number]  Arrival date & time 12/20/11  2053   First MD Initiated Contact with Patient 12/20/11 2206      Chief Complaint  Patient presents with  . Abdominal Pain    (Consider location/radiation/quality/duration/timing/severity/associated sxs/prior treatment) HPI  46 year old female presents complaining of abdominal pain with associate nausea and vomiting. Pt was discharged from hospital yesterday for management of same complaint.  It was thought that she has gastroenteritis and UTI.  Once being discharged pt continue to endorse lower abd pain, with persistent nausea, vomiting and diarrhea, unable to keep anything down.  She described pain as a throbbing, crampy sensation to her lower abdomen, that is non radiating, moderate in severity.  Eating makes it worse.  No feer, chills, cp, sob, back pain, or urinary sxs.    Past Medical History  Diagnosis Date  . Arthritis   . Prolapsed disk   . Heart murmur   . Sleep deprivation   . Depression     Past Surgical History  Procedure Date  . Cesarean section     No family history on file.  History  Substance Use Topics  . Smoking status: Former Games developer  . Smokeless tobacco: Not on file  . Alcohol Use: No    OB History    Grav Para Term Preterm Abortions TAB SAB Ect Mult Living                  Review of Systems  All other systems reviewed and are negative.    Allergies  Review of patient's allergies indicates no known allergies.  Home Medications   Current Outpatient Rx  Name  Route  Sig  Dispense  Refill  . BECLOMETHASONE DIPROPIONATE 40 MCG/ACT IN AERS   Inhalation   Inhale 2 puffs into the lungs 2 (two) times daily.   1 Inhaler   1   . BENZTROPINE MESYLATE 1 MG PO TABS   Oral   Take 1 tablet (1 mg total) by mouth daily.   30 tablet   1   . FLUOXETINE HCL 20 MG PO CAPS   Oral   Take 1 capsule (20 mg total) by mouth daily.   30 capsule   1   . LEVOTHYROXINE SODIUM 75 MCG PO  TABS   Oral   Take 1 tablet (75 mcg total) by mouth daily.   30 tablet   1   . MIRTAZAPINE 15 MG PO TABS   Oral   Take 1 tablet (15 mg total) by mouth at bedtime.   30 tablet   1   . ONDANSETRON HCL 8 MG PO TABS   Oral   Take 1 tablet (8 mg total) by mouth every 8 (eight) hours as needed for nausea.   20 tablet   1   . OXYCODONE-ACETAMINOPHEN 10-325 MG PO TABS   Oral   Take 1 tablet by mouth 3 (three) times daily.   30 tablet   0   . PANTOPRAZOLE SODIUM 40 MG PO TBEC   Oral   Take 1 tablet (40 mg total) by mouth daily.   30 tablet   1   . PROMETHAZINE HCL 25 MG PO TABS   Oral   Take 1 tablet (25 mg total) by mouth every 6 (six) hours as needed for nausea.   30 tablet   1   . PSYLLIUM 95 % PO PACK   Oral   Take 1 packet by mouth daily.  30 each   1   . RANITIDINE HCL 150 MG PO TABS   Oral   Take 2 tablets (300 mg total) by mouth daily.   30 tablet   1   . RISPERIDONE 4 MG PO TABS   Oral   Take 1 tablet (4 mg total) by mouth daily.   30 tablet   1   . TRAZODONE HCL 100 MG PO TABS   Oral   Take 1 tablet (100 mg total) by mouth at bedtime.   30 tablet   1     BP 148/84  Temp 98.5 F (36.9 C) (Oral)  Resp 18  SpO2 97%  LMP 06/14/2011  Physical Exam  Nursing note and vitals reviewed. Constitutional: She is oriented to person, place, and time. She appears well-developed and well-nourished. She appears distressed (tearful, uncomfortable appearing).       Awake, alert, nontoxic appearance  HENT:  Head: Atraumatic.  Mouth/Throat: Oropharynx is clear and moist.  Eyes: Conjunctivae normal are normal. Right eye exhibits no discharge. Left eye exhibits no discharge.  Neck: Neck supple.  Cardiovascular: Normal rate and regular rhythm.   Pulmonary/Chest: Effort normal. No respiratory distress. She exhibits no tenderness.  Abdominal: Soft. There is tenderness (suprapubic tenderness without guarding or rebound.  No hernia.  tenderness to epigastric  region, no guarding.  ). There is no rebound.  Musculoskeletal: She exhibits no tenderness.       ROM appears intact, no obvious focal weakness  Neurological: She is alert and oriented to person, place, and time.       Mental status and motor strength appears intact  Skin: No rash noted.  Psychiatric: She has a normal mood and affect.    ED Course  Procedures (including critical care time)   Labs Reviewed  URINALYSIS, ROUTINE W REFLEX MICROSCOPIC  PREGNANCY, URINE  CBC WITH DIFFERENTIAL  COMPREHENSIVE METABOLIC PANEL  LIPASE, BLOOD   Results for orders placed during the hospital encounter of 12/20/11  URINALYSIS, ROUTINE W REFLEX MICROSCOPIC      Component Value Range   Color, Urine YELLOW  YELLOW   APPearance CLOUDY (*) CLEAR   Specific Gravity, Urine 1.020  1.005 - 1.030   pH 6.0  5.0 - 8.0   Glucose, UA NEGATIVE  NEGATIVE mg/dL   Hgb urine dipstick LARGE (*) NEGATIVE   Bilirubin Urine NEGATIVE  NEGATIVE   Ketones, ur 40 (*) NEGATIVE mg/dL   Protein, ur NEGATIVE  NEGATIVE mg/dL   Urobilinogen, UA 0.2  0.0 - 1.0 mg/dL   Nitrite NEGATIVE  NEGATIVE   Leukocytes, UA LARGE (*) NEGATIVE  PREGNANCY, URINE      Component Value Range   Preg Test, Ur NEGATIVE  NEGATIVE  CBC WITH DIFFERENTIAL      Component Value Range   WBC 9.6  4.0 - 10.5 K/uL   RBC 6.34 (*) 3.87 - 5.11 MIL/uL   Hemoglobin 15.1 (*) 12.0 - 15.0 g/dL   HCT 11.9  14.7 - 82.9 %   MCV 70.0 (*) 78.0 - 100.0 fL   MCH 23.8 (*) 26.0 - 34.0 pg   MCHC 34.0  30.0 - 36.0 g/dL   RDW 56.2  13.0 - 86.5 %   Platelets 224  150 - 400 K/uL   Neutrophils Relative 72  43 - 77 %   Lymphocytes Relative 17  12 - 46 %   Monocytes Relative 7  3 - 12 %   Eosinophils Relative 4  0 - 5 %  Basophils Relative 0  0 - 1 %   Neutro Abs 6.9  1.7 - 7.7 K/uL   Lymphs Abs 1.6  0.7 - 4.0 K/uL   Monocytes Absolute 0.7  0.1 - 1.0 K/uL   Eosinophils Absolute 0.4  0.0 - 0.7 K/uL   Basophils Absolute 0.0  0.0 - 0.1 K/uL   RBC Morphology  POLYCHROMASIA PRESENT     Smear Review LARGE PLATELETS PRESENT    COMPREHENSIVE METABOLIC PANEL      Component Value Range   Sodium 137  135 - 145 mEq/L   Potassium 4.0  3.5 - 5.1 mEq/L   Chloride 100  96 - 112 mEq/L   CO2 24  19 - 32 mEq/L   Glucose, Bld 97  70 - 99 mg/dL   BUN 12  6 - 23 mg/dL   Creatinine, Ser 4.09 (*) 0.50 - 1.10 mg/dL   Calcium 81.1  8.4 - 91.4 mg/dL   Total Protein 8.3  6.0 - 8.3 g/dL   Albumin 4.4  3.5 - 5.2 g/dL   AST 16  0 - 37 U/L   ALT 18  0 - 35 U/L   Alkaline Phosphatase 61  39 - 117 U/L   Total Bilirubin 0.5  0.3 - 1.2 mg/dL   GFR calc non Af Amer 57 (*) >90 mL/min   GFR calc Af Amer 66 (*) >90 mL/min  LIPASE, BLOOD      Component Value Range   Lipase 26  11 - 59 U/L  URINE MICROSCOPIC-ADD ON      Component Value Range   Squamous Epithelial / LPF MANY (*) RARE   WBC, UA 3-6  <3 WBC/hpf   RBC / HPF 21-50  <3 RBC/hpf   Bacteria, UA MANY (*) RARE   Urine-Other MUCOUS PRESENT     Ct Abdomen Pelvis W Contrast  12/17/2011  *RADIOLOGY REPORT*  Clinical Data: Abdominal discomfort and nausea.  Nausea vomiting. History of leukemia.  CT ABDOMEN AND PELVIS WITH CONTRAST  Technique:  Multidetector CT imaging of the abdomen and pelvis was performed following the standard protocol during bolus administration of intravenous contrast.  Contrast: OMNIPAQUE IOHEXOL 300 MG/ML  SOLN  Comparison: None.  Findings: Lung bases:  Minimal subsegmental atelectasis at the lung bases. Normal heart size without pericardial or pleural effusion.  Abdomen/pelvis:  Probable mild hepatic steatosis. No focal liver lesion.  Normal spleen, stomach, pancreas, gallbladder, biliary tract, adrenal glands.  3 mm lower pole right renal nonobstructive calculus.  Tiny interpolar left renal lesion which is too small to characterize.  No retroperitoneal or retrocrural adenopathy.  Portions of the colon appear mildly thickened.  Felt to be secondary to underdistension. Normal terminal ileum.   Normal appendix; image 60/series 6. Normal small bowel without abdominal ascites.    No pelvic adenopathy.  Normal urinary bladder.  Numerous enhancing uterine lesions, most consistent with fibroids.  The largest measures 2.9 cm within the left side of the fundus.  No adnexal mass. Trace free pelvic fluid is likely physiologic.  Bones/Musculoskeletal:  No acute osseous abnormality.  Moderate to large disc bulge at the L4-L5 level.  IMPRESSION:  1. No acute process in the abdomen or pelvis. 2.  Multifocal areas of colonic underdistension.  Apparent wall thickening is felt to be secondary.  Mild colitis cannot be entirely excluded. 3.  Uterine fibroids. 4.  Probable mild hepatic steatosis. 5.  Right renal calculus.   Original Report Authenticated By: Jeronimo Greaves, M.D.  1. N/v/d 2. Abdominal pain  MDM  Pt presents with persistent lower abd pain  With n/v/d.  Has been treated for same in hospital and was discharged yesterday.  Her abd CT scan 3 days ago shows no acute finding.  Will repeat labs and will give IVF and pain meds.    12:36 AM Her work up is unremarkable.  No evidence of dehydration and no signs of UTI.  Has blood in urine, which has been noted on prior UA.  Recommend f/u with PCP for recheck.  I recommend pt to f/u with GI specialist for further care.  Pt has pain meds and antinausea meds prescribed from being discharged yesterday, i recommend continue.  Pt voice understanding and agrees with plan.  Care discussed with my attending.    BP 148/84  Temp 98.5 F (36.9 C) (Oral)  Resp 18  SpO2 97%  LMP 06/14/2011  I have reviewed nursing notes and vital signs. I personally reviewed the imaging tests through PACS system  I reviewed available ER/hospitalization records thought the EMR       Fayrene Helper, [place] 12/21/11 [id number]

## 2011-12-20 NOTE — ED Notes (Signed)
Pt actively vomiting in triage 

## 2011-12-20 NOTE — ED Notes (Signed)
The pt has abd pain with n and v.  She was just released from this hospital with gallstones as a diagnosis.   She has had these symptoms for 5-6 days

## 2011-12-21 MED ORDER — OXYCODONE-ACETAMINOPHEN 5-325 MG PO TABS: 2.0000 | ORAL_TABLET | Freq: Once | ORAL | Status: DC

## 2011-12-21 MED ORDER — MORPHINE SULFATE 4 MG/ML IJ SOLN
4.0000 mg | Freq: Once | INTRAMUSCULAR | Status: AC
  Administered 2011-12-21: 4 mg via INTRAVENOUS
  Filled 2011-12-21: qty 1

## 2011-12-21 MED ORDER — HYDROMORPHONE HCL PF 1 MG/ML IJ SOLN: 1.0000 mg | Freq: Once | INTRAMUSCULAR | Status: DC

## 2011-12-21 MED ORDER — ONDANSETRON HCL 4 MG/2ML IJ SOLN
4.0000 mg | Freq: Once | INTRAMUSCULAR | Status: AC
  Administered 2011-12-21: 4 mg via INTRAVENOUS
  Filled 2011-12-21: qty 2

## 2011-12-21 NOTE — ED Provider Notes (Signed)
Medical screening examination/treatment/procedure(s) were performed by non-physician practitioner and as supervising physician I was immediately available for consultation/collaboration.   Celene Kras, MD 12/21/11 [phone]

## 2011-12-21 NOTE — ED Notes (Signed)
Pt discharged.Vital signs stable.Pt still complains of pain 10/10 and nausea.Pt says that she will be back tomorrow to ED as she is still feeling sick and not happy to go home.

## 2011-12-22 LAB — URINE CULTURE: Colony Count: 30000

## 2011-12-27 NOTE — ED Notes (Signed)
Chart returned from EDP office  With orders written for Diflucan 150 mg x 1 that needs to be called to pharmacy.

## 2011-12-28 ENCOUNTER — Telehealth (HOSPITAL_COMMUNITY): Payer: Self-pay | Admitting: Emergency Medicine

## 2011-12-30 NOTE — ED Notes (Signed)
Prescription called in to [facility] at [phone] for diflucan 150mg  po x1 dose per [facility] np-c, no refills.

## 2012-04-04 ENCOUNTER — Encounter (HOSPITAL_COMMUNITY): Payer: Self-pay | Admitting: Emergency Medicine

## 2012-04-04 ENCOUNTER — Emergency Department (HOSPITAL_COMMUNITY)
Admission: EM | Admit: 2012-04-04 | Discharge: 2012-04-04 | Disposition: A | Discharge status: Home / Self Care | Payer: Medicare Other | Attending: Emergency Medicine | Admitting: Emergency Medicine

## 2012-04-04 DIAGNOSIS — R197 Diarrhea, unspecified: Secondary | ICD-10-CM | POA: Insufficient documentation

## 2012-04-04 DIAGNOSIS — R112 Nausea with vomiting, unspecified: Secondary | ICD-10-CM | POA: Insufficient documentation

## 2012-04-04 DIAGNOSIS — G478 Other sleep disorders: Secondary | ICD-10-CM | POA: Insufficient documentation

## 2012-04-04 DIAGNOSIS — F329 Major depressive disorder, single episode, unspecified: Secondary | ICD-10-CM | POA: Insufficient documentation

## 2012-04-04 DIAGNOSIS — Z8739 Personal history of other diseases of the musculoskeletal system and connective tissue: Secondary | ICD-10-CM | POA: Insufficient documentation

## 2012-04-04 DIAGNOSIS — F3289 Other specified depressive episodes: Secondary | ICD-10-CM | POA: Insufficient documentation

## 2012-04-04 DIAGNOSIS — M1990 Unspecified osteoarthritis, unspecified site: Secondary | ICD-10-CM | POA: Insufficient documentation

## 2012-04-04 DIAGNOSIS — R6883 Chills (without fever): Secondary | ICD-10-CM | POA: Insufficient documentation

## 2012-04-04 DIAGNOSIS — Z79899 Other long term (current) drug therapy: Secondary | ICD-10-CM | POA: Insufficient documentation

## 2012-04-04 DIAGNOSIS — R011 Cardiac murmur, unspecified: Secondary | ICD-10-CM | POA: Insufficient documentation

## 2012-04-04 LAB — BASIC METABOLIC PANEL
Calcium: 9.4 mg/dL (ref 8.4–10.5)
GFR calc Af Amer: >90 mL/min (ref >90)
GFR calc non Af Amer: 81 mL/min — ABNORMAL LOW (ref >90)
Glucose, Bld: 136 mg/dL — ABNORMAL HIGH (ref 70–99)
Sodium: 141 mEq/L (ref 135–145)

## 2012-04-04 LAB — CBC WITH DIFFERENTIAL/PLATELET
Basophils Absolute: 0 10*3/uL (ref 0.0–0.1)
Basophils Relative: 0 % (ref 0–1)
Eosinophils Absolute: 0.4 10*3/uL (ref 0.0–0.7)
Hemoglobin: 13.9 g/dL (ref 12.0–15.0)
Lymphocytes Relative: 33 % (ref 12–46)
MCH: 23.4 pg — ABNORMAL LOW (ref 26.0–34.0)
MCHC: 34.6 g/dL (ref 30.0–36.0)
Monocytes Absolute: 1.1 10*3/uL — ABNORMAL HIGH (ref 0.1–1.0)
Neutrophils Relative %: 53 % (ref 43–77)
Platelets: 274 10*3/uL (ref 150–400)
RDW: 14.6 % (ref 11.5–15.5)

## 2012-04-04 MED ORDER — SODIUM CHLORIDE 0.9 % IV BOLUS (SEPSIS): 1000.0000 mL | INTRAVENOUS | Status: DC

## 2012-04-04 MED ORDER — HYDROMORPHONE HCL PF 1 MG/ML IJ SOLN
1.0000 mg | Freq: Once | INTRAMUSCULAR | Status: AC
  Administered 2012-04-04: 1 mg via INTRAMUSCULAR
  Filled 2012-04-04: qty 1

## 2012-04-04 MED ORDER — HYDROCODONE-ACETAMINOPHEN 5-325 MG PO TABS: 2.0000 | ORAL_TABLET | Freq: Four times a day (QID) | ORAL | Status: DC | PRN

## 2012-04-04 MED ORDER — MORPHINE SULFATE 4 MG/ML IJ SOLN
4.0000 mg | Freq: Once | INTRAMUSCULAR | Status: DC
  Filled 2012-04-04: qty 1

## 2012-04-04 MED ORDER — ONDANSETRON HCL 4 MG PO TABS: 4.0000 mg | ORAL_TABLET | Freq: Three times a day (TID) | ORAL | Status: DC | PRN

## 2012-04-04 MED ORDER — ONDANSETRON HCL 4 MG/2ML IJ SOLN
4.0000 mg | Freq: Once | INTRAMUSCULAR | Status: DC
  Filled 2012-04-04: qty 2

## 2012-04-04 MED ORDER — ONDANSETRON 4 MG PO TBDP
ORAL_TABLET | ORAL | Status: AC
  Administered 2012-04-04: 4 mg
  Filled 2012-04-04: qty 1

## 2012-04-04 MED ORDER — ONDANSETRON 4 MG PO TBDP
4.0000 mg | ORAL_TABLET | Freq: Once | ORAL | Status: AC
  Administered 2012-04-04: 4 mg via ORAL
  Filled 2012-04-04: qty 1

## 2012-04-04 NOTE — ED Notes (Signed)
Pt c/o N/V/D and chills x 2 days; pt sts took tylenol 2 hours ago and then became sweaty

## 2012-04-04 NOTE — ED Provider Notes (Signed)
History     CSN: [id number]  Arrival date & time 04/04/12  1813   First MD Initiated Contact with Patient 04/04/12 1831      Chief Complaint  Patient presents with  . Emesis  . Chills  . Diarrhea    (Consider location/radiation/quality/duration/timing/severity/associated sxs/prior treatment) HPI Comments: This is a 47 year old female, past medical history remarkable for depression, who presents emergency department with chief complaint of nausea, vomiting, diarrhea. Patient states the symptoms began last night. Been persistent ever since. She endorses associated crampy abdominal pain. Also endorses subjective fever and chills, however she did not measure the actual temperature. She denies any blood in her vomit or stool. She has not tried anything to alleviate her symptoms. She states that her level of discomfort is severe. Her pain does not radiate.  The history is provided by the patient. No language interpreter was used.    Past Medical History  Diagnosis Date  . Arthritis   . Prolapsed disk   . Heart murmur   . Sleep deprivation   . Depression     Past Surgical History  Procedure Laterality Date  . Cesarean section      History reviewed. No pertinent family history.  History  Substance Use Topics  . Smoking status: Former Games developer  . Smokeless tobacco: Not on file  . Alcohol Use: No    OB History   Grav Para Term Preterm Abortions TAB SAB Ect Mult Living                  Review of Systems  All other systems reviewed and are negative.    Allergies  Review of patient's allergies indicates no known allergies.  Home Medications   Current Outpatient Rx  Name  Route  Sig  Dispense  Refill  . beclomethasone (QVAR) 40 MCG/ACT inhaler   Inhalation   Inhale 2 puffs into the lungs 2 (two) times daily.   1 Inhaler   1   . benztropine (COGENTIN) 1 MG tablet   Oral   Take 1 tablet (1 mg total) by mouth daily.   30 tablet   1   . FLUoxetine (PROZAC) 20 MG  capsule   Oral   Take 1 capsule (20 mg total) by mouth daily.   30 capsule   1   . levothyroxine (SYNTHROID, LEVOTHROID) 75 MCG tablet   Oral   Take 1 tablet (75 mcg total) by mouth daily.   30 tablet   1   . mirtazapine (REMERON) 15 MG tablet   Oral   Take 1 tablet (15 mg total) by mouth at bedtime.   30 tablet   1   . ondansetron (ZOFRAN) 8 MG tablet   Oral   Take 1 tablet (8 mg total) by mouth every 8 (eight) hours as needed for nausea.   20 tablet   1   . oxyCODONE-acetaminophen (PERCOCET) 10-325 MG per tablet   Oral   Take 1 tablet by mouth 3 (three) times daily.   30 tablet   0   . pantoprazole (PROTONIX) 40 MG tablet   Oral   Take 1 tablet (40 mg total) by mouth daily.   30 tablet   1   . promethazine (PHENERGAN) 25 MG tablet   Oral   Take 1 tablet (25 mg total) by mouth every 6 (six) hours as needed for nausea.   30 tablet   1   . psyllium (HYDROCIL/METAMUCIL) 95 % PACK   Oral  Take 1 packet by mouth daily.   30 each   1   . ranitidine (ZANTAC) 150 MG tablet   Oral   Take 2 tablets (300 mg total) by mouth daily.   30 tablet   1   . risperidone (RISPERDAL) 4 MG tablet   Oral   Take 1 tablet (4 mg total) by mouth daily.   30 tablet   1   . traZODone (DESYREL) 100 MG tablet   Oral   Take 1 tablet (100 mg total) by mouth at bedtime.   30 tablet   1     BP 151/106  Pulse 68  Temp(Src) 97.1 F (36.2 C) (Oral)  Resp 18  SpO2 97%  Physical Exam  Nursing note and vitals reviewed. Constitutional: She is oriented to person, place, and time. She appears well-developed and well-nourished.  HENT:  Head: Normocephalic and atraumatic.  Moist mucous membranes  Eyes: Conjunctivae and EOM are normal. Pupils are equal, round, and reactive to light.  Neck: Normal range of motion. Neck supple.  Cardiovascular: Normal rate and regular rhythm.  Exam reveals no gallop and no friction rub.   No murmur heard. Pulmonary/Chest: Effort normal and breath  sounds normal. No respiratory distress. She has no wheezes. She has no rales. She exhibits no tenderness.  Abdominal: Soft. Bowel sounds are normal. She exhibits no distension and no mass. There is no tenderness. There is no rebound and no guarding.  Diffusely uncomfortable to palpation, but without focal tenderness. No right upper quadrant tenderness, or Murphy sign, no localized right lower cord tenderness, or pain at McBurney's point, no rebound tenderness, no left lower quadrant tenderness, no fluid wave, or signs of peritonitis.  Musculoskeletal: Normal range of motion. She exhibits no edema and no tenderness.  Neurological: She is alert and oriented to person, place, and time.  Skin: Skin is warm and dry.  Psychiatric: She has a normal mood and affect. Her behavior is normal. Judgment and thought content normal.    ED Course  Procedures (including critical care time)  Labs Reviewed  CBC WITH DIFFERENTIAL - Abnormal; Notable for the following:    RBC 5.94 (*)    MCV 67.7 (*)    MCH 23.4 (*)    All other components within normal limits  BASIC METABOLIC PANEL   Results for orders placed during the hospital encounter of 04/04/12  CBC WITH DIFFERENTIAL      Result Value Range   WBC 10.5  4.0 - 10.5 K/uL   RBC 5.94 (*) 3.87 - 5.11 MIL/uL   Hemoglobin 13.9  12.0 - 15.0 g/dL   HCT 16.1  09.6 - 04.5 %   MCV 67.7 (*) 78.0 - 100.0 fL   MCH 23.4 (*) 26.0 - 34.0 pg   MCHC 34.6  30.0 - 36.0 g/dL   RDW 40.9  81.1 - 91.4 %   Platelets 274  150 - 400 K/uL   Neutrophils Relative 53  43 - 77 %   Lymphocytes Relative 33  12 - 46 %   Monocytes Relative 10  3 - 12 %   Eosinophils Relative 4  0 - 5 %   Basophils Relative 0  0 - 1 %   Neutro Abs 5.5  1.7 - 7.7 K/uL   Lymphs Abs 3.5  0.7 - 4.0 K/uL   Monocytes Absolute 1.1 (*) 0.1 - 1.0 K/uL   Eosinophils Absolute 0.4  0.0 - 0.7 K/uL   Basophils Absolute 0.0  0.0 -  0.1 K/uL   Smear Review MORPHOLOGY UNREMARKABLE    BASIC METABOLIC PANEL       Result Value Range   Sodium 141  135 - 145 mEq/L   Potassium 3.6  3.5 - 5.1 mEq/L   Chloride 107  96 - 112 mEq/L   CO2 23  19 - 32 mEq/L   Glucose, Bld 136 (*) 70 - 99 mg/dL   BUN 8  6 - 23 mg/dL   Creatinine, Ser 2.13  0.50 - 1.10 mg/dL   Calcium 9.4  8.4 - 08.6 mg/dL   GFR calc non Af Amer 81 (*) >90 mL/min   GFR calc Af Amer >90  >90 mL/min  LIPASE, BLOOD      Result Value Range   Lipase 16  11 - 59 U/L      1. Nausea and vomiting       MDM  47 year old female with nausea, vomiting, and diarrhea. Symptoms began last night. No blood in vomit or stool. Will treat with Zofran, fluids, and pain medicine. Will reevaluate. Will check basic labs and electrolytes.  8:15 PM Unable to obtain IV access. Will give zofran ODT and try oral fluids.  IM dilaudid.  Patient feels less nauseated, but has not tolerated PO.  Abdominal cramps still persist.   9:52 PM Patient's pain is improved significantly, patient much calmer and more relaxed, however she still say it hurts. Currently tolerating by mouth fluids. Will discharge the patient with some ultram and antiemetic.  No focal abdominal tenderness.  No fever.    Roxy Horseman, PA-C 04/04/12 2212

## 2012-04-04 NOTE — ED Provider Notes (Signed)
Medical screening examination/treatment/procedure(s) were performed by non-physician practitioner and as supervising physician I was immediately available for consultation/collaboration.   Whitney Plunkett, MD 04/04/12 2357 

## 2012-04-04 NOTE — ED Notes (Signed)
Discharge instructions reviewed with pt and daughters. All verbalized understanding.

## 2012-04-05 ENCOUNTER — Encounter (HOSPITAL_COMMUNITY): Payer: Self-pay | Admitting: Emergency Medicine

## 2012-04-05 ENCOUNTER — Emergency Department (HOSPITAL_COMMUNITY)
Admission: EM | Admit: 2012-04-05 | Discharge: 2012-04-05 | Disposition: A | Discharge status: Home / Self Care | Payer: Medicare Other | Attending: Emergency Medicine | Admitting: Emergency Medicine

## 2012-04-05 ENCOUNTER — Emergency Department (HOSPITAL_COMMUNITY): Payer: Medicare Other

## 2012-04-05 DIAGNOSIS — F329 Major depressive disorder, single episode, unspecified: Secondary | ICD-10-CM | POA: Insufficient documentation

## 2012-04-05 DIAGNOSIS — R197 Diarrhea, unspecified: Secondary | ICD-10-CM | POA: Insufficient documentation

## 2012-04-05 DIAGNOSIS — Z862 Personal history of diseases of the blood and blood-forming organs and certain disorders involving the immune mechanism: Secondary | ICD-10-CM | POA: Insufficient documentation

## 2012-04-05 DIAGNOSIS — Z87891 Personal history of nicotine dependence: Secondary | ICD-10-CM | POA: Insufficient documentation

## 2012-04-05 DIAGNOSIS — R1084 Generalized abdominal pain: Secondary | ICD-10-CM | POA: Insufficient documentation

## 2012-04-05 DIAGNOSIS — D259 Leiomyoma of uterus, unspecified: Secondary | ICD-10-CM

## 2012-04-05 DIAGNOSIS — Z8739 Personal history of other diseases of the musculoskeletal system and connective tissue: Secondary | ICD-10-CM | POA: Insufficient documentation

## 2012-04-05 DIAGNOSIS — R011 Cardiac murmur, unspecified: Secondary | ICD-10-CM | POA: Insufficient documentation

## 2012-04-05 DIAGNOSIS — Z79899 Other long term (current) drug therapy: Secondary | ICD-10-CM | POA: Insufficient documentation

## 2012-04-05 DIAGNOSIS — F3289 Other specified depressive episodes: Secondary | ICD-10-CM | POA: Insufficient documentation

## 2012-04-05 HISTORY — DX: Sickle-cell trait: D57.3

## 2012-04-05 LAB — COMPREHENSIVE METABOLIC PANEL
ALT: 26 U/L (ref 0–35)
AST: 19 U/L (ref 0–37)
Albumin: 3.8 g/dL (ref 3.5–5.2)
Alkaline Phosphatase: 67 U/L (ref 39–117)
BUN: 9 mg/dL (ref 6–23)
CO2: 25 mEq/L (ref 19–32)
Calcium: 9.7 mg/dL (ref 8.4–10.5)
Chloride: 102 mEq/L (ref 96–112)
Creatinine, Ser: 0.89 mg/dL (ref 0.50–1.10)
GFR calc Af Amer: >90 mL/min (ref >90)
GFR calc non Af Amer: 78 mL/min — ABNORMAL LOW (ref >90)
Glucose, Bld: 113 mg/dL — ABNORMAL HIGH (ref 70–99)
Potassium: 3.7 mEq/L (ref 3.5–5.1)
Sodium: 138 mEq/L (ref 135–145)
Total Bilirubin: 0.4 mg/dL (ref 0.3–1.2)
Total Protein: 7.7 g/dL (ref 6.0–8.3)

## 2012-04-05 LAB — LACTIC ACID, PLASMA: Lactic Acid, Venous: 2 mmol/L (ref 0.5–2.2)

## 2012-04-05 LAB — CBC WITH DIFFERENTIAL/PLATELET
Basophils Absolute: 0 10*3/uL (ref 0.0–0.1)
Basophils Relative: 0 % (ref 0–1)
Eosinophils Absolute: 0.1 10*3/uL (ref 0.0–0.7)
Eosinophils Relative: 1 % (ref 0–5)
HCT: 39.5 % (ref 36.0–46.0)
Hemoglobin: 13.8 g/dL (ref 12.0–15.0)
Lymphocytes Relative: 21 % (ref 12–46)
Lymphs Abs: 2.2 10*3/uL (ref 0.7–4.0)
MCH: 23.7 pg — ABNORMAL LOW (ref 26.0–34.0)
MCHC: 34.9 g/dL (ref 30.0–36.0)
MCV: 67.8 fL — ABNORMAL LOW (ref 78.0–100.0)
Monocytes Absolute: 0.7 10*3/uL (ref 0.1–1.0)
Monocytes Relative: 7 % (ref 3–12)
Neutro Abs: 7.4 10*3/uL (ref 1.7–7.7)
Neutrophils Relative %: 71 % (ref 43–77)
Platelets: 256 10*3/uL (ref 150–400)
RBC: 5.83 MIL/uL — ABNORMAL HIGH (ref 3.87–5.11)
RDW: 14.5 % (ref 11.5–15.5)
WBC: 10.4 10*3/uL (ref 4.0–10.5)

## 2012-04-05 MED ORDER — IOHEXOL 300 MG/ML  SOLN
25.0000 mL | INTRAMUSCULAR | Status: AC
  Administered 2012-04-05 (×2): 25 mL via ORAL

## 2012-04-05 MED ORDER — ONDANSETRON 4 MG PO TBDP
4.0000 mg | ORAL_TABLET | Freq: Once | ORAL | Status: AC
  Administered 2012-04-05: 4 mg via ORAL
  Filled 2012-04-05: qty 1

## 2012-04-05 MED ORDER — HYDROMORPHONE HCL PF 1 MG/ML IJ SOLN: 1.0000 mg | Freq: Once | INTRAMUSCULAR | Status: DC

## 2012-04-05 MED ORDER — ONDANSETRON HCL 4 MG/2ML IJ SOLN
4.0000 mg | Freq: Once | INTRAMUSCULAR | Status: AC
  Administered 2012-04-05: 4 mg via INTRAVENOUS
  Filled 2012-04-05: qty 2

## 2012-04-05 MED ORDER — METOCLOPRAMIDE HCL 5 MG/ML IJ SOLN
10.0000 mg | Freq: Once | INTRAMUSCULAR | Status: AC
  Administered 2012-04-05: 10 mg via INTRAVENOUS
  Filled 2012-04-05: qty 2

## 2012-04-05 MED ORDER — SODIUM CHLORIDE 0.9 % IV BOLUS (SEPSIS)
1000.0000 mL | Freq: Once | INTRAVENOUS | Status: AC
  Administered 2012-04-05: 1000 mL via INTRAVENOUS

## 2012-04-05 MED ORDER — IOHEXOL 300 MG/ML  SOLN
100.0000 mL | Freq: Once | INTRAMUSCULAR | Status: AC | PRN
  Administered 2012-04-05: 80 mL via INTRAVENOUS

## 2012-04-05 MED ORDER — ONDANSETRON HCL 4 MG/2ML IJ SOLN: 4.0000 mg | Freq: Once | INTRAMUSCULAR | Status: DC

## 2012-04-05 MED ORDER — KETOROLAC TROMETHAMINE 30 MG/ML IJ SOLN
30.0000 mg | Freq: Once | INTRAMUSCULAR | Status: AC
  Administered 2012-04-05: 30 mg via INTRAMUSCULAR
  Filled 2012-04-05: qty 1

## 2012-04-05 NOTE — ED Notes (Signed)
Patient requested to give urine sample She states that she can not give sample at this time. Patient refused lab draw, she requested that blood draw be done with IV start.

## 2012-04-05 NOTE — ED Notes (Signed)
Patient transported to CT 

## 2012-04-05 NOTE — ED Notes (Signed)
Finished drinking 1 glass Oral contrast, CT notified & aware.  C/o continued nausea, no emesis

## 2012-04-05 NOTE — ED Notes (Signed)
Reports n/v/d & abd pain x 3 days. Seen here for same yesterday & D/c home with a "stomach bug".

## 2012-04-05 NOTE — ED Notes (Signed)
N/v  X 3 days  Was seen here yesterday for same and given rx for pain and nausea but they  Have not t helped

## 2012-04-06 NOTE — ED Provider Notes (Signed)
History     CSN: [id number]  Arrival date & time 04/05/12  1304   First MD Initiated Contact with Patient 04/05/12 1533      Chief Complaint  Patient presents with  . Emesis    Patient is a 47 y.o. female presenting with vomiting.  Emesis Associated symptoms: abdominal pain and diarrhea   Associated symptoms: no chills and no headaches    47 year old female who presents to the emergency department with nausea, vomiting, and diarrhea.  Reports that this has been going on for the last three days.  Has had this numerous times before.  Reports that the vomiting is NBNB.  Happening approx 5-8x/day.  Small volume.  Unable to tolerate much PO.  Diarrhea also nonbloody.  Happening approx 5x/day.  Associated with mild, diffuse, abdominal pain.  No radiation.  Not alleviated with tylenol.  No other symptoms.  Past Medical History  Diagnosis Date  . Arthritis   . Prolapsed disk   . Heart murmur   . Sleep deprivation   . Depression   . Sickle cell trait     Past Surgical History  Procedure Laterality Date  . Cesarean section     Family History: Reviewed.  None pertinent.    History  Substance Use Topics  . Smoking status: Former Games developer  . Smokeless tobacco: Not on file  . Alcohol Use: No    Review of Systems  Constitutional: Negative for fever and chills.  HENT: Negative for congestion, rhinorrhea, neck pain and neck stiffness.   Respiratory: Negative for cough and shortness of breath.   Cardiovascular: Negative for chest pain.  Gastrointestinal: Positive for nausea, vomiting, abdominal pain and diarrhea. Negative for abdominal distention.  Endocrine: Negative for polyuria.  Genitourinary: Negative for dysuria.  Skin: Negative for rash.  Neurological: Negative for headaches.  Psychiatric/Behavioral: Negative.   All other systems reviewed and are negative.    Allergies  Review of patient's allergies indicates no known allergies.  Home Medications   Current Outpatient  Rx  Name  Route  Sig  Dispense  Refill  . beclomethasone (QVAR) 40 MCG/ACT inhaler   Inhalation   Inhale 2 puffs into the lungs 2 (two) times daily.   1 Inhaler   1   . benztropine (COGENTIN) 1 MG tablet   Oral   Take 1 tablet (1 mg total) by mouth daily.   30 tablet   1   . levothyroxine (SYNTHROID, LEVOTHROID) 75 MCG tablet   Oral   Take 1 tablet (75 mcg total) by mouth daily.   30 tablet   1   . mirtazapine (REMERON) 15 MG tablet   Oral   Take 1 tablet (15 mg total) by mouth at bedtime.   30 tablet   1   . ondansetron (ZOFRAN) 8 MG tablet   Oral   Take 1 tablet (8 mg total) by mouth every 8 (eight) hours as needed for nausea.   20 tablet   1   . oxyCODONE-acetaminophen (PERCOCET) 10-325 MG per tablet   Oral   Take 1 tablet by mouth 3 (three) times daily.   30 tablet   0   . promethazine (PHENERGAN) 25 MG tablet   Oral   Take 1 tablet (25 mg total) by mouth every 6 (six) hours as needed for nausea.   30 tablet   1   . psyllium (HYDROCIL/METAMUCIL) 95 % PACK   Oral   Take 1 packet by mouth daily.   30  each   1   . ranitidine (ZANTAC) 150 MG tablet   Oral   Take 2 tablets (300 mg total) by mouth daily.   30 tablet   1   . risperidone (RISPERDAL) 4 MG tablet   Oral   Take 1 tablet (4 mg total) by mouth daily.   30 tablet   1   . traZODone (DESYREL) 100 MG tablet   Oral   Take 1 tablet (100 mg total) by mouth at bedtime.   30 tablet   1     BP 138/80  Pulse 57  Temp(Src) 99 F (37.2 C) (Oral)  Resp 17  SpO2 99%  LMP 03/13/2012  Physical Exam  Nursing note and vitals reviewed. Constitutional: She is oriented to person, place, and time. She appears well-developed and well-nourished. No distress.  HENT:  Head: Normocephalic and atraumatic.  Right Ear: External ear normal.  Left Ear: External ear normal.  Nose: Nose normal.  Mouth/Throat: Oropharynx is clear and moist. No oropharyngeal exudate.  Eyes: EOM are normal. Pupils are equal,  round, and reactive to light.  Neck: Normal range of motion. Neck supple. No tracheal deviation present.  Cardiovascular: Normal rate.   Pulmonary/Chest: Effort normal and breath sounds normal. No stridor. No respiratory distress. She has no wheezes. She has no rales.  Abdominal: Soft. She exhibits no distension. There is generalized tenderness. There is no rigidity, no rebound and no guarding.  Musculoskeletal: Normal range of motion.  Neurological: She is alert and oriented to person, place, and time.  Skin: Skin is warm and dry. She is not diaphoretic.    ED Course  Procedures (including critical care time)  Labs Reviewed  COMPREHENSIVE METABOLIC PANEL - Abnormal; Notable for the following:    Glucose, Bld 113 (*)    GFR calc non Af Amer 78 (*)    All other components within normal limits  CBC WITH DIFFERENTIAL - Abnormal; Notable for the following:    RBC 5.83 (*)    MCV 67.8 (*)    MCH 23.7 (*)    All other components within normal limits  LIPASE, BLOOD  LACTIC ACID, PLASMA   Ct Abdomen Pelvis W Contrast  04/05/2012  *RADIOLOGY REPORT*  Clinical Data: Diffuse abdominal pain and vomiting  CT ABDOMEN AND PELVIS WITH CONTRAST  Technique:  Multidetector CT imaging of the abdomen and pelvis was performed following the standard protocol during bolus administration of intravenous contrast.  Contrast: 80mL OMNIPAQUE IOHEXOL 300 MG/ML  SOLN  Comparison: CT abdomen pelvis 12/16/2011  Findings: There are multiple scattered tiny blebs versus emphysematous changes at the lung bases.  No consolidation or pleural effusion.  Heart size is normal.  Mild diffuse fatty infiltration of the liver is again noted.  No focal hepatic mass or biliary ductal dilatation.  The gallbladder, spleen, adrenal glands and pancreas are  within normal limits.  A 5 mm low density lesion in the upper pole of the left kidney is stable, and statistically likely a tiny cyst. Stable 3 mm nonobstructing right lower pole renal  calculus.  The stomach appears normal.  Small bowel loops are normal in caliber and wall thickness.  Terminal ileum appears normal.  The colon is normal in caliber and wall thickness.  The appendix is normal.  Urinary bladder appears normal.  Normal ovaries/adnexa. Heterogeneously enhancing rounded masses associated with the left aspect of the uterine fundus and the mid uterine body appears stable and most likely reflect fibroids.  The abdominal aorta  is normal in caliber and enhancement.  Negative for lymphadenopathy, ascites, or free air.  Abdominal wall is unremarkable.  Soft tissues of the body wall are within normal limits.  Vertebral bodies are normal in height and alignment.  No acute or suspicious bony abnormality.  IMPRESSION:  1.  No acute process identified in the abdomen or pelvis. 2.  Stable nonobstructing lower pole right renal calculus. 3.  Mild hepatic steatosis. 4.  Uterine fibroids.   Original Report Authenticated By: Britta Mccreedy, M.D.      1. Uterine fibroid       MDM   47 year old female who presents the emergency department with 3 days of nausea vomiting diarrhea and abdominal pain. Exam with mild diffuse tenderness. Patient initially actively vomiting. Nausea controlled with Zofran and Reglan. Pain controlled with Toradol and Tylenol. Abdominal workup performed in largely negative for acute abnormality. CT scan showing uterine fibroids. Patient reexamined without any evidence of torsion, PID, or other pelvic cause of pain. No evidence of appendicitis. Doubt biliary pathology. Likely viral gastroenteritis. Will treat symptomatically. Patient will tolerate by mouth. Heart because of her prescription. Recommend close followup with primary care practitioner. No other acute concerns on the emergency department. Patient safe for discharge. Patient discharged.       Arloa Koh, MD 04/06/12 [phone]

## 2012-04-07 ENCOUNTER — Other Ambulatory Visit: Payer: Self-pay

## 2012-04-07 ENCOUNTER — Emergency Department (HOSPITAL_COMMUNITY)
Admission: EM | Admit: 2012-04-07 | Discharge: 2012-04-07 | Disposition: A | Discharge status: Home / Self Care | Payer: Medicare Other | Attending: Emergency Medicine | Admitting: Emergency Medicine

## 2012-04-07 ENCOUNTER — Encounter (HOSPITAL_COMMUNITY): Payer: Self-pay | Admitting: Emergency Medicine

## 2012-04-07 DIAGNOSIS — R011 Cardiac murmur, unspecified: Secondary | ICD-10-CM | POA: Insufficient documentation

## 2012-04-07 DIAGNOSIS — Z8739 Personal history of other diseases of the musculoskeletal system and connective tissue: Secondary | ICD-10-CM | POA: Insufficient documentation

## 2012-04-07 DIAGNOSIS — Z862 Personal history of diseases of the blood and blood-forming organs and certain disorders involving the immune mechanism: Secondary | ICD-10-CM | POA: Insufficient documentation

## 2012-04-07 DIAGNOSIS — R471 Dysarthria and anarthria: Secondary | ICD-10-CM | POA: Insufficient documentation

## 2012-04-07 DIAGNOSIS — G259 Extrapyramidal and movement disorder, unspecified: Secondary | ICD-10-CM | POA: Insufficient documentation

## 2012-04-07 DIAGNOSIS — Z87891 Personal history of nicotine dependence: Secondary | ICD-10-CM | POA: Insufficient documentation

## 2012-04-07 DIAGNOSIS — IMO0002 Reserved for concepts with insufficient information to code with codable children: Secondary | ICD-10-CM | POA: Insufficient documentation

## 2012-04-07 DIAGNOSIS — F329 Major depressive disorder, single episode, unspecified: Secondary | ICD-10-CM | POA: Insufficient documentation

## 2012-04-07 DIAGNOSIS — F3289 Other specified depressive episodes: Secondary | ICD-10-CM | POA: Insufficient documentation

## 2012-04-07 DIAGNOSIS — Z72820 Sleep deprivation: Secondary | ICD-10-CM | POA: Insufficient documentation

## 2012-04-07 DIAGNOSIS — T426X5A Adverse effect of other antiepileptic and sedative-hypnotic drugs, initial encounter: Secondary | ICD-10-CM | POA: Insufficient documentation

## 2012-04-07 DIAGNOSIS — R4789 Other speech disturbances: Secondary | ICD-10-CM | POA: Insufficient documentation

## 2012-04-07 MED ORDER — SODIUM CHLORIDE 0.9 % IV SOLN: INTRAVENOUS | Status: DC

## 2012-04-07 MED ORDER — ONDANSETRON HCL 4 MG PO TABS: 4.0000 mg | ORAL_TABLET | Freq: Four times a day (QID) | ORAL | Status: DC

## 2012-04-07 MED ORDER — DIPHENHYDRAMINE HCL 25 MG PO CAPS: 50.0000 mg | ORAL_CAPSULE | Freq: Once | ORAL | Status: DC

## 2012-04-07 MED ORDER — DIPHENHYDRAMINE HCL 50 MG/ML IJ SOLN: 25.0000 mg | Freq: Once | INTRAMUSCULAR | Status: DC

## 2012-04-07 MED ORDER — DEXAMETHASONE SODIUM PHOSPHATE 10 MG/ML IJ SOLN: 10.0000 mg | Freq: Once | INTRAMUSCULAR | Status: DC

## 2012-04-07 MED ORDER — FAMOTIDINE IN NACL 20-0.9 MG/50ML-% IV SOLN: 20.0000 mg | Freq: Once | INTRAVENOUS | Status: DC

## 2012-04-07 MED ORDER — ONDANSETRON 4 MG PO TBDP: 4.0000 mg | ORAL_TABLET | Freq: Once | ORAL | Status: DC

## 2012-04-07 MED ORDER — DIPHENHYDRAMINE HCL 25 MG PO CAPS
50.0000 mg | ORAL_CAPSULE | Freq: Once | ORAL | Status: AC
  Administered 2012-04-07: 50 mg via ORAL
  Filled 2012-04-07: qty 2

## 2012-04-07 NOTE — ED Notes (Signed)
Upon assessment pt tongue appears to be swollen- pt states this began around 6pm today.  Upon symptom onset pt became dizzy, denies LOC- neuro exam normal.  Pt difficult to understand due to tongue swelling.  Airway intact, no respiratory distress noted at this time.  Denies any shortness of breath.  Pt denies any new medications or eating anything new.  Pt has been on a clear liquid diet per MD orders, pt admits to vomiting recently.  Pt alert and oriented X4.

## 2012-04-07 NOTE — ED Notes (Addendum)
Pt to ED via [facility] from home.  Pt family called EMS to evaluate what they thought might be a stroke.  EMS reports pt states her mouth is twitching and pt had a lisp upon arrival- symptoms have resolved at this time.  Pt has had nausea and vomiting for the past 4 days.  Ambulatory to pt room.  Neuro exam negative.

## 2012-04-07 NOTE — ED Provider Notes (Signed)
I saw and evaluated the patient, reviewed the resident's note and I agree with the findings and plan exceptif noted.  46yF with n/v. On my exam mild diffuse abdominal tenderness. Doesn't really localize. No rebound guarding. Ct neg for acute pathology. Symptoms controlled. Low suspicion for emergent etiology.   Raeford Razor, MD 04/07/12 2139

## 2012-04-07 NOTE — ED Provider Notes (Signed)
History     CSN: [id number]  Arrival date & time 04/07/12  1831   First MD Initiated Contact with Patient 04/07/12 1832      Chief Complaint  Patient presents with  . Nausea    (Consider location/radiation/quality/duration/timing/severity/associated sxs/prior treatment) HPI  Victoria Flores is a 47 y.o. female brought in by EMScomplaining of her tongue "not doing what it should." onset of symptoms was at 6 PM. Patient states that she had onset of dysarthria and lisping. When queried patient does endorse a difficulty ambulating as well.patient denies shortness of breath, abdominal pain. Patient was seen for 3 days ago and 2 days ago for nausea and vomiting   Past Medical History  Diagnosis Date  . Arthritis   . Prolapsed disk   . Heart murmur   . Sleep deprivation   . Depression   . Sickle cell trait     Past Surgical History  Procedure Laterality Date  . Cesarean section      No family history on file.  History  Substance Use Topics  . Smoking status: Former Games developer  . Smokeless tobacco: Not on file  . Alcohol Use: No    OB History   Grav Para Term Preterm Abortions TAB SAB Ect Mult Living                  Review of Systems  Constitutional: Negative for fever.  Respiratory: Negative for shortness of breath.   Cardiovascular: Negative for chest pain.  Gastrointestinal: Negative for nausea, vomiting, abdominal pain and diarrhea.  Neurological: Positive for speech difficulty.  All other systems reviewed and are negative.    Allergies  Review of patient's allergies indicates no known allergies.  Home Medications   Current Outpatient Rx  Name  Route  Sig  Dispense  Refill  . beclomethasone (QVAR) 40 MCG/ACT inhaler   Inhalation   Inhale 2 puffs into the lungs 2 (two) times daily.   1 Inhaler   1   . benztropine (COGENTIN) 1 MG tablet   Oral   Take 1 tablet (1 mg total) by mouth daily.   30 tablet   1   . levothyroxine (SYNTHROID, LEVOTHROID) 75  MCG tablet   Oral   Take 1 tablet (75 mcg total) by mouth daily.   30 tablet   1   . mirtazapine (REMERON) 15 MG tablet   Oral   Take 1 tablet (15 mg total) by mouth at bedtime.   30 tablet   1   . ondansetron (ZOFRAN) 8 MG tablet   Oral   Take 1 tablet (8 mg total) by mouth every 8 (eight) hours as needed for nausea.   20 tablet   1   . oxyCODONE-acetaminophen (PERCOCET) 10-325 MG per tablet   Oral   Take 1 tablet by mouth 3 (three) times daily.   30 tablet   0   . promethazine (PHENERGAN) 25 MG tablet   Oral   Take 1 tablet (25 mg total) by mouth every 6 (six) hours as needed for nausea.   30 tablet   1   . psyllium (HYDROCIL/METAMUCIL) 95 % PACK   Oral   Take 1 packet by mouth daily.   30 each   1   . ranitidine (ZANTAC) 150 MG tablet   Oral   Take 2 tablets (300 mg total) by mouth daily.   30 tablet   1   . risperidone (RISPERDAL) 4 MG tablet   Oral  Take 1 tablet (4 mg total) by mouth daily.   30 tablet   1   . traZODone (DESYREL) 100 MG tablet   Oral   Take 1 tablet (100 mg total) by mouth at bedtime.   30 tablet   1     BP 134/89  Pulse 87  Temp(Src) 98.7 F (37.1 C) (Oral)  Resp 20  SpO2 100%  LMP 03/13/2012  Physical Exam  Nursing note and vitals reviewed. Constitutional: She is oriented to person, place, and time. She appears well-developed and well-nourished. No distress.  HENT:  Head: Normocephalic.  Mouth/Throat: Oropharynx is clear and moist.  No mucosal or uvula edema, patient intermittently speaks with her tongue hanging out of slurred speech and then spontaneously improves.  Eyes: Conjunctivae and EOM are normal. Pupils are equal, round, and reactive to light.  Neck: Normal range of motion.  Cardiovascular: Normal rate, regular rhythm and intact distal pulses.   Pulmonary/Chest: Effort normal and breath sounds normal. No stridor. No respiratory distress. She has no wheezes. She has no rales. She exhibits no tenderness.  No  stridor, no wheezing.  Abdominal: Soft. Bowel sounds are normal.  Musculoskeletal: Normal range of motion.  Neurological: She is alert and oriented to person, place, and time.  Cranial nerves III through XII intact, strength 5 out of 5x4 extremities, negative pronator drift, finger to nose and heel-to-shin coordinated, sensation intact to pinprick and light touch, gait is coordinated and Romberg is negative.   Psychiatric: She has a normal mood and affect.    ED Course  Procedures (including critical care time)  Labs Reviewed - No data to display Ct Abdomen Pelvis W Contrast  04/05/2012  *RADIOLOGY REPORT*  Clinical Data: Diffuse abdominal pain and vomiting  CT ABDOMEN AND PELVIS WITH CONTRAST  Technique:  Multidetector CT imaging of the abdomen and pelvis was performed following the standard protocol during bolus administration of intravenous contrast.  Contrast: 80mL OMNIPAQUE IOHEXOL 300 MG/ML  SOLN  Comparison: CT abdomen pelvis 12/16/2011  Findings: There are multiple scattered tiny blebs versus emphysematous changes at the lung bases.  No consolidation or pleural effusion.  Heart size is normal.  Mild diffuse fatty infiltration of the liver is again noted.  No focal hepatic mass or biliary ductal dilatation.  The gallbladder, spleen, adrenal glands and pancreas are  within normal limits.  A 5 mm low density lesion in the upper pole of the left kidney is stable, and statistically likely a tiny cyst. Stable 3 mm nonobstructing right lower pole renal calculus.  The stomach appears normal.  Small bowel loops are normal in caliber and wall thickness.  Terminal ileum appears normal.  The colon is normal in caliber and wall thickness.  The appendix is normal.  Urinary bladder appears normal.  Normal ovaries/adnexa. Heterogeneously enhancing rounded masses associated with the left aspect of the uterine fundus and the mid uterine body appears stable and most likely reflect fibroids.  The abdominal aorta is  normal in caliber and enhancement.  Negative for lymphadenopathy, ascites, or free air.  Abdominal wall is unremarkable.  Soft tissues of the body wall are within normal limits.  Vertebral bodies are normal in height and alignment.  No acute or suspicious bony abnormality.  IMPRESSION:  1.  No acute process identified in the abdomen or pelvis. 2.  Stable nonobstructing lower pole right renal calculus. 3.  Mild hepatic steatosis. 4.  Uterine fibroids.   Original Report Authenticated By: Britta Mccreedy, M.D.  1. Extrapyramidal reaction       MDM  Patient with slurred speech and intermittent voice changes. There is no stridor, no tongue swelling, no mucosal edema, no difficulties with air movement.  This is a shared visit with the attending physician who personally evaluated the patient. Dr Blinda Leatherwood has advised that this is likely an extrapyramidal symptoms from the addition of Phenergan to Cogentin, risperdal and Remeron.  Patient's significantly improved after by mouth Benadryl administration. Patient advised to DC Phenergan and written a prescription for by mouth Zofran.  Ceasar Mons Vitals:   04/07/12 1848 04/07/12 2000  BP: 134/89 141/90  Pulse: 87 78  Temp: 98.7 F (37.1 C)   TempSrc: Oral   Resp: 20 13  SpO2: 100% 100%    Pt verbalized understanding and agrees with care plan. Outpatient follow-up and return precautions given.    Discharge Medication List as of 04/07/2012  8:57 PM    START taking these medications   Details  ondansetron (ZOFRAN) 4 MG tablet Take 1 tablet (4 mg total) by mouth every 6 (six) hours., Starting 04/07/2012, Until Discontinued, Print                 Wynetta Emery, PA-C 04/08/12 [phone]

## 2012-04-12 NOTE — ED Provider Notes (Signed)
Medical screening examination/treatment/procedure(s) were performed by non-physician practitioner and as supervising physician I was immediately available for consultation/collaboration.  Bluma Buresh J. Shaleta Ruacho, MD 04/12/12 2349 

## 2012-06-16 ENCOUNTER — Ambulatory Visit: Payer: Self-pay | Admitting: Obstetrics

## 2012-08-04 ENCOUNTER — Encounter (HOSPITAL_COMMUNITY): Payer: Self-pay | Admitting: Family Medicine

## 2012-08-04 ENCOUNTER — Emergency Department (HOSPITAL_COMMUNITY)
Admission: EM | Admit: 2012-08-04 | Discharge: 2012-08-04 | Disposition: A | Discharge status: Home / Self Care | Payer: Medicare Other | Attending: Emergency Medicine | Admitting: Emergency Medicine

## 2012-08-04 DIAGNOSIS — F329 Major depressive disorder, single episode, unspecified: Secondary | ICD-10-CM | POA: Insufficient documentation

## 2012-08-04 DIAGNOSIS — Z8669 Personal history of other diseases of the nervous system and sense organs: Secondary | ICD-10-CM | POA: Insufficient documentation

## 2012-08-04 DIAGNOSIS — F3289 Other specified depressive episodes: Secondary | ICD-10-CM | POA: Insufficient documentation

## 2012-08-04 DIAGNOSIS — R1110 Vomiting, unspecified: Secondary | ICD-10-CM | POA: Insufficient documentation

## 2012-08-04 DIAGNOSIS — R109 Unspecified abdominal pain: Secondary | ICD-10-CM | POA: Insufficient documentation

## 2012-08-04 DIAGNOSIS — Z79899 Other long term (current) drug therapy: Secondary | ICD-10-CM | POA: Insufficient documentation

## 2012-08-04 DIAGNOSIS — Z862 Personal history of diseases of the blood and blood-forming organs and certain disorders involving the immune mechanism: Secondary | ICD-10-CM | POA: Insufficient documentation

## 2012-08-04 DIAGNOSIS — Z3202 Encounter for pregnancy test, result negative: Secondary | ICD-10-CM | POA: Insufficient documentation

## 2012-08-04 DIAGNOSIS — Z8679 Personal history of other diseases of the circulatory system: Secondary | ICD-10-CM | POA: Insufficient documentation

## 2012-08-04 DIAGNOSIS — Z87891 Personal history of nicotine dependence: Secondary | ICD-10-CM | POA: Insufficient documentation

## 2012-08-04 DIAGNOSIS — Z8739 Personal history of other diseases of the musculoskeletal system and connective tissue: Secondary | ICD-10-CM | POA: Insufficient documentation

## 2012-08-04 LAB — COMPREHENSIVE METABOLIC PANEL
ALT: 12 U/L (ref 0–35)
AST: 13 U/L (ref 0–37)
Calcium: 9.8 mg/dL (ref 8.4–10.5)
Sodium: 140 mEq/L (ref 135–145)
Total Protein: 7.6 g/dL (ref 6.0–8.3)

## 2012-08-04 LAB — POCT PREGNANCY, URINE: Preg Test, Ur: NEGATIVE

## 2012-08-04 LAB — CBC WITH DIFFERENTIAL/PLATELET
Eosinophils Absolute: 0.1 10*3/uL (ref 0.0–0.7)
MCH: 23.3 pg — ABNORMAL LOW (ref 26.0–34.0)
MCHC: 33.7 g/dL (ref 30.0–36.0)
Monocytes Absolute: 0.8 10*3/uL (ref 0.1–1.0)
Neutrophils Relative %: 67 % (ref 43–77)
Platelets: 278 10*3/uL (ref 150–400)
RBC: 5.84 MIL/uL — ABNORMAL HIGH (ref 3.87–5.11)

## 2012-08-04 LAB — URINE MICROSCOPIC-ADD ON

## 2012-08-04 LAB — URINALYSIS, ROUTINE W REFLEX MICROSCOPIC
Glucose, UA: 100 mg/dL — AB
Specific Gravity, Urine: 1.019 (ref 1.005–1.030)
Urobilinogen, UA: 0.2 mg/dL (ref 0.0–1.0)

## 2012-08-04 MED ORDER — SODIUM CHLORIDE 0.9 % IV BOLUS (SEPSIS)
2000.0000 mL | Freq: Once | INTRAVENOUS | Status: AC
  Administered 2012-08-04: 2000 mL via INTRAVENOUS

## 2012-08-04 MED ORDER — METOCLOPRAMIDE HCL 10 MG PO TABS: 10.0000 mg | ORAL_TABLET | Freq: Four times a day (QID) | ORAL | Status: DC | PRN

## 2012-08-04 MED ORDER — POTASSIUM CHLORIDE CRYS ER 20 MEQ PO TBCR
40.0000 meq | EXTENDED_RELEASE_TABLET | Freq: Once | ORAL | Status: AC
  Administered 2012-08-04: 40 meq via ORAL
  Filled 2012-08-04: qty 2

## 2012-08-04 MED ORDER — METOCLOPRAMIDE HCL 5 MG/ML IJ SOLN
10.0000 mg | Freq: Once | INTRAMUSCULAR | Status: AC
  Administered 2012-08-04: 10 mg via INTRAVENOUS
  Filled 2012-08-04: qty 2

## 2012-08-04 MED ORDER — OXYCODONE-ACETAMINOPHEN 5-325 MG PO TABS: 2.0000 | ORAL_TABLET | ORAL | Status: DC | PRN

## 2012-08-04 MED ORDER — POTASSIUM CHLORIDE 10 MEQ/100ML IV SOLN
10.0000 meq | Freq: Once | INTRAVENOUS | Status: AC
  Administered 2012-08-04: 10 meq via INTRAVENOUS
  Filled 2012-08-04: qty 100

## 2012-08-04 MED ORDER — LORAZEPAM 2 MG/ML IJ SOLN
1.0000 mg | Freq: Once | INTRAMUSCULAR | Status: AC
  Administered 2012-08-04: 1 mg via INTRAVENOUS
  Filled 2012-08-04: qty 1

## 2012-08-04 MED ORDER — ONDANSETRON 8 MG PO TBDP: ORAL_TABLET | ORAL | Status: DC

## 2012-08-04 MED ORDER — ONDANSETRON HCL 4 MG/2ML IJ SOLN
4.0000 mg | Freq: Once | INTRAMUSCULAR | Status: AC
  Administered 2012-08-04: 4 mg via INTRAVENOUS
  Filled 2012-08-04: qty 2

## 2012-08-04 MED ORDER — SODIUM CHLORIDE 0.9 % IV SOLN
INTRAVENOUS | Status: DC
  Administered 2012-08-04: 17:00:00 via INTRAVENOUS

## 2012-08-04 MED ORDER — HYDROMORPHONE HCL PF 1 MG/ML IJ SOLN
1.0000 mg | Freq: Once | INTRAMUSCULAR | Status: AC
  Administered 2012-08-04: 1 mg via INTRAVENOUS
  Filled 2012-08-04: qty 1

## 2012-08-04 MED ORDER — HYDROMORPHONE HCL PF 2 MG/ML IJ SOLN
2.0000 mg | Freq: Once | INTRAMUSCULAR | Status: AC
  Administered 2012-08-04: 2 mg via INTRAMUSCULAR
  Filled 2012-08-04: qty 1

## 2012-08-04 NOTE — ED Provider Notes (Signed)
History    CSN: [id number] Arrival date & time 08/04/12  1218  First MD Initiated Contact with Patient 08/04/12 1337     Chief Complaint  Patient presents with  . Abdominal Pain  . Emesis   (Consider location/radiation/quality/duration/timing/severity/associated sxs/prior Treatment) HPI This 47 year old female has a few hours of diffuse constant but waxing and waning abdominal pain with multiple episodes of nonbloody vomiting and diarrhea like she has had multiple times in the past, her last flareup of symptoms like this was a few months ago when she was hospitalized, she is no recent illnesses no fever no chest pain no cough no shortness breath no dysuria. There is no treatment prior to arrival. Her pain is severe. She is yelling continuously and accusing emergency room staff of being mean to her yet we are trying to be as nice as possible to her to help her. Past Medical History  Diagnosis Date  . Arthritis   . Prolapsed disk   . Heart murmur   . Sleep deprivation   . Depression   . Sickle cell trait    Past Surgical History  Procedure Laterality Date  . Cesarean section     History reviewed. No pertinent family history. History  Substance Use Topics  . Smoking status: Former Games developer  . Smokeless tobacco: Not on file  . Alcohol Use: No   OB History   Grav Para Term Preterm Abortions TAB SAB Ect Mult Living                 Review of Systems 10 Systems reviewed and are negative for acute change except as noted in the HPI. Allergies  Garlic  Home Medications   Current Outpatient Rx  Name  Route  Sig  Dispense  Refill  . beclomethasone (QVAR) 40 MCG/ACT inhaler   Inhalation   Inhale 2 puffs into the lungs 2 (two) times daily.   1 Inhaler   1   . benztropine (COGENTIN) 1 MG tablet   Oral   Take 1 tablet (1 mg total) by mouth daily.   30 tablet   1   . levothyroxine (SYNTHROID, LEVOTHROID) 75 MCG tablet   Oral   Take 1 tablet (75 mcg total) by mouth daily.   30 tablet   1   . mirtazapine (REMERON) 15 MG tablet   Oral   Take 1 tablet (15 mg total) by mouth at bedtime.   30 tablet   1   . risperidone (RISPERDAL) 4 MG tablet   Oral   Take 1 tablet (4 mg total) by mouth daily.   30 tablet   1   . metoCLOPramide (REGLAN) 10 MG tablet   Oral   Take 1 tablet (10 mg total) by mouth every 6 (six) hours as needed (nausea/headache).   6 tablet   0   . ondansetron (ZOFRAN-ODT) 8 MG disintegrating tablet   Oral   Take 8 mg by mouth every 4 (four) hours as needed for nausea.         Marland Kitchen oxyCODONE-acetaminophen (PERCOCET) 5-325 MG per tablet   Oral   Take 2 tablets by mouth every 4 (four) hours as needed for pain.   6 tablet   0    BP 118/74  Pulse 64  Temp(Src) 98.1 F (36.7 C) (Oral)  Resp 16  SpO2 98% Physical Exam  Nursing note and vitals reviewed. Constitutional:  Awake, alert, nontoxic appearance.  HENT:  Head: Atraumatic.  Eyes: Right eye exhibits no  discharge. Left eye exhibits no discharge.  Neck: Neck supple.  Cardiovascular: Normal rate and regular rhythm.   No murmur heard. Pulmonary/Chest: Effort normal and breath sounds normal. No respiratory distress. She has no wheezes. She has no rales. She exhibits no tenderness.  Abdominal: Soft. Bowel sounds are normal. She exhibits no distension and no mass. There is tenderness. There is guarding. There is no rebound.  Minimal diffuse abdominal tenderness without rebound and no CVA tenderness  Musculoskeletal: She exhibits no tenderness.  Baseline ROM, no obvious new focal weakness.  Neurological: She is alert.  Mental status and motor strength appears baseline for patient and situation.  Skin: No rash noted.  Psychiatric: She has a normal mood and affect.    ED Course  Procedures (including critical care time) Patient feels much better now pain free with abdomen soft and nontender recheck.1700 Prescription printer did not work so had to re-print prescriptions. Pt  feels improved after observation and/or treatment in ED.Patient informed of clinical course, understand medical decision-making process, and agree with plan. Labs Reviewed  CBC WITH DIFFERENTIAL - Abnormal; Notable for the following:    RBC 5.84 (*)    MCV 69.2 (*)    MCH 23.3 (*)    All other components within normal limits  COMPREHENSIVE METABOLIC PANEL - Abnormal; Notable for the following:    Potassium 2.9 (*)    Glucose, Bld 145 (*)    GFR calc non Af Amer 82 (*)    All other components within normal limits  URINALYSIS, ROUTINE W REFLEX MICROSCOPIC - Abnormal; Notable for the following:    APPearance CLOUDY (*)    pH 8.5 (*)    Glucose, UA 100 (*)    Hgb urine dipstick MODERATE (*)    Ketones, ur >80 (*)    Protein, ur 100 (*)    All other components within normal limits  URINE MICROSCOPIC-ADD ON - Abnormal; Notable for the following:    Squamous Epithelial / LPF MANY (*)    Bacteria, UA MANY (*)    All other components within normal limits  URINE CULTURE  LIPASE, BLOOD  POCT PREGNANCY, URINE   Ct Abdomen Pelvis W Contrast  08/07/2012   *RADIOLOGY REPORT*  Clinical Data: Abdominal pain.  Nausea vomiting and diarrhea for 4 days.  CT ABDOMEN AND PELVIS WITH CONTRAST  Technique:  Multidetector CT imaging of the abdomen and pelvis was performed following the standard protocol during bolus administration of intravenous contrast.  Contrast: 50mL OMNIPAQUE IOHEXOL 300 MG/ML  SOLN, OMNIPAQUE IOHEXOL 300 MG/ML  SOLN  Comparison: 04/05/2012.  Findings: Basilar subsegmental atelectasis.  Portions of the bowel are under distended and evaluation limited however, there is no evidence of extraluminal bowel inflammatory process, free fluid or free air.  Specifically, no inflammation surrounds the elongated appendix.  Uterine fibroids.  No worrisome adnexal mass.  Fatty infiltration of the liver without focal worrisome mass.  No calcified gallstones.  Nonobstructing left lower pole 6 mm renal  calculi.  Left upper pole 6 mm low density structure possibly a renal cyst although too small to characterize.  No worrisome splenic, pancreatic or adrenal lesion.  No abdominal aortic aneurysm.  No bony destructive lesion.  No hernia detected.  IMPRESSION: Portions of the bowel are under distended and evaluation limited however, there is no evidence of extraluminal bowel inflammatory process, free fluid or free air.  Specifically, no inflammation surrounds the elongated appendix.  Uterine fibroids.  Fatty infiltration of the liver.  Nonobstructing  left lower pole 6 mm renal calculi.   Original Report Authenticated By: Lacy Duverney, M.D.   1. Abdominal pain     MDM  I doubt any other [facility] precluding discharge at this time including, but not necessarily limited to the following:SBI, peritonitis.  Hurman Horn, MD 08/07/12 2010

## 2012-08-04 NOTE — ED Notes (Signed)
Per pt having abdominal pain and N,V,D that started this am. Pt moaning and yelling in pain at triage.

## 2012-08-04 NOTE — ED Notes (Signed)
Pt reporting continued nausea. MD Bednar made aware.

## 2012-08-04 NOTE — ED Notes (Signed)
Pt discharged.Vital signs stable and GCS 15 

## 2012-08-04 NOTE — ED Notes (Signed)
2 IV attempts with no success.

## 2012-08-05 LAB — URINE CULTURE: Colony Count: NO GROWTH

## 2012-08-06 ENCOUNTER — Encounter (HOSPITAL_COMMUNITY): Payer: Self-pay | Admitting: Emergency Medicine

## 2012-08-06 ENCOUNTER — Emergency Department (HOSPITAL_COMMUNITY)
Admission: EM | Admit: 2012-08-06 | Discharge: 2012-08-06 | Disposition: A | Discharge status: Home / Self Care | Payer: Medicare Other | Attending: Emergency Medicine | Admitting: Emergency Medicine

## 2012-08-06 DIAGNOSIS — Z8739 Personal history of other diseases of the musculoskeletal system and connective tissue: Secondary | ICD-10-CM | POA: Insufficient documentation

## 2012-08-06 DIAGNOSIS — R112 Nausea with vomiting, unspecified: Secondary | ICD-10-CM

## 2012-08-06 DIAGNOSIS — Z862 Personal history of diseases of the blood and blood-forming organs and certain disorders involving the immune mechanism: Secondary | ICD-10-CM | POA: Insufficient documentation

## 2012-08-06 DIAGNOSIS — F329 Major depressive disorder, single episode, unspecified: Secondary | ICD-10-CM | POA: Insufficient documentation

## 2012-08-06 DIAGNOSIS — R197 Diarrhea, unspecified: Secondary | ICD-10-CM | POA: Insufficient documentation

## 2012-08-06 DIAGNOSIS — Z8639 Personal history of other endocrine, nutritional and metabolic disease: Secondary | ICD-10-CM | POA: Insufficient documentation

## 2012-08-06 DIAGNOSIS — F3289 Other specified depressive episodes: Secondary | ICD-10-CM | POA: Insufficient documentation

## 2012-08-06 DIAGNOSIS — R011 Cardiac murmur, unspecified: Secondary | ICD-10-CM | POA: Insufficient documentation

## 2012-08-06 DIAGNOSIS — Z87891 Personal history of nicotine dependence: Secondary | ICD-10-CM | POA: Insufficient documentation

## 2012-08-06 DIAGNOSIS — Z3202 Encounter for pregnancy test, result negative: Secondary | ICD-10-CM | POA: Insufficient documentation

## 2012-08-06 DIAGNOSIS — R109 Unspecified abdominal pain: Secondary | ICD-10-CM | POA: Insufficient documentation

## 2012-08-06 DIAGNOSIS — G479 Sleep disorder, unspecified: Secondary | ICD-10-CM | POA: Insufficient documentation

## 2012-08-06 DIAGNOSIS — Z79899 Other long term (current) drug therapy: Secondary | ICD-10-CM | POA: Insufficient documentation

## 2012-08-06 LAB — URINE MICROSCOPIC-ADD ON

## 2012-08-06 LAB — CBC WITH DIFFERENTIAL/PLATELET
Basophils Relative: 1 % (ref 0–1)
Eosinophils Absolute: 0.1 10*3/uL (ref 0.0–0.7)
HCT: 39.7 % (ref 36.0–46.0)
Hemoglobin: 13.1 g/dL (ref 12.0–15.0)
MCH: 22.6 pg — ABNORMAL LOW (ref 26.0–34.0)
MCHC: 33 g/dL (ref 30.0–36.0)
MCV: 68.6 fL — ABNORMAL LOW (ref 78.0–100.0)
Monocytes Absolute: 0.9 10*3/uL (ref 0.1–1.0)
Neutro Abs: 4.7 10*3/uL (ref 1.7–7.7)

## 2012-08-06 LAB — COMPREHENSIVE METABOLIC PANEL
Alkaline Phosphatase: 69 U/L (ref 39–117)
BUN: 8 mg/dL (ref 6–23)
Calcium: 9.8 mg/dL (ref 8.4–10.5)
GFR calc Af Amer: >90 mL/min (ref >90)
Glucose, Bld: 102 mg/dL — ABNORMAL HIGH (ref 70–99)
Potassium: 2.8 mEq/L — ABNORMAL LOW (ref 3.5–5.1)
Total Protein: 7.7 g/dL (ref 6.0–8.3)

## 2012-08-06 LAB — URINALYSIS, ROUTINE W REFLEX MICROSCOPIC
Bilirubin Urine: NEGATIVE
Nitrite: NEGATIVE
Protein, ur: NEGATIVE mg/dL
Specific Gravity, Urine: 1.014 (ref 1.005–1.030)
Urobilinogen, UA: 1 mg/dL (ref 0.0–1.0)

## 2012-08-06 LAB — LIPASE, BLOOD: Lipase: 27 U/L (ref 11–59)

## 2012-08-06 MED ORDER — ONDANSETRON HCL 4 MG/2ML IJ SOLN
4.0000 mg | Freq: Once | INTRAMUSCULAR | Status: AC
  Administered 2012-08-06: 4 mg via INTRAVENOUS
  Filled 2012-08-06: qty 2

## 2012-08-06 MED ORDER — GI COCKTAIL ~~LOC~~
30.0000 mL | Freq: Once | ORAL | Status: AC
  Administered 2012-08-06: 30 mL via ORAL
  Filled 2012-08-06: qty 30

## 2012-08-06 MED ORDER — ONDANSETRON 8 MG PO TBDP: ORAL_TABLET | ORAL | Status: DC

## 2012-08-06 MED ORDER — POTASSIUM CHLORIDE 10 MEQ/100ML IV SOLN
10.0000 meq | INTRAVENOUS | Status: AC
  Administered 2012-08-06 (×2): 10 meq via INTRAVENOUS
  Filled 2012-08-06 (×2): qty 100

## 2012-08-06 MED ORDER — PROMETHAZINE HCL 25 MG/ML IJ SOLN
25.0000 mg | Freq: Once | INTRAMUSCULAR | Status: AC
  Administered 2012-08-06: 25 mg via INTRAVENOUS
  Filled 2012-08-06: qty 1

## 2012-08-06 MED ORDER — LORAZEPAM 2 MG/ML IJ SOLN
1.0000 mg | Freq: Once | INTRAMUSCULAR | Status: AC
  Administered 2012-08-06: 1 mg via INTRAVENOUS
  Filled 2012-08-06: qty 1

## 2012-08-06 MED ORDER — HYDROMORPHONE HCL PF 1 MG/ML IJ SOLN
1.0000 mg | Freq: Once | INTRAMUSCULAR | Status: AC
  Administered 2012-08-06: 1 mg via INTRAVENOUS
  Filled 2012-08-06: qty 1

## 2012-08-06 MED ORDER — SODIUM CHLORIDE 0.9 % IV BOLUS (SEPSIS)
1000.0000 mL | Freq: Once | INTRAVENOUS | Status: AC
  Administered 2012-08-06: 1000 mL via INTRAVENOUS

## 2012-08-06 MED ORDER — POTASSIUM CHLORIDE CRYS ER 20 MEQ PO TBCR
40.0000 meq | EXTENDED_RELEASE_TABLET | Freq: Once | ORAL | Status: AC
  Administered 2012-08-06: 40 meq via ORAL
  Filled 2012-08-06: qty 2

## 2012-08-06 MED ORDER — FAMOTIDINE IN NACL 20-0.9 MG/50ML-% IV SOLN
20.0000 mg | Freq: Once | INTRAVENOUS | Status: AC
  Administered 2012-08-06: 20 mg via INTRAVENOUS
  Filled 2012-08-06: qty 50

## 2012-08-06 NOTE — ED Notes (Signed)
Pt c/o n/v/d and lower abdominal pain. Pt was seen at [facility] 2 days ago for the same c/o. Pt states she has numbness to L side which started today. Pt ambulatory to exam room with steady gait. Pt arrives with family member.

## 2012-08-06 NOTE — ED Provider Notes (Addendum)
History    CSN: [id number] Arrival date & time 08/06/12  1526  First MD Initiated Contact with Patient 08/06/12 1550     Chief Complaint  Patient presents with  . N/V/D   . Abdominal Pain   (Consider location/radiation/quality/duration/timing/severity/associated sxs/prior Treatment) HPI  Patient presents with concern of nausea, abdominal pain, vomiting, diarrhea. This episode began several days ago, but became worse again today, 2 days after her recent M.D. evaluation at our affiliated facility. Today symptoms been progressive with no alleviating or exacerbating factors.  The patient does describe consistent anorexia. Pain is diffuse, abdominal, nonradiating.   Past Medical History  Diagnosis Date  . Arthritis   . Prolapsed disk   . Heart murmur   . Sleep deprivation   . Depression   . Sickle cell trait    Past Surgical History  Procedure Laterality Date  . Cesarean section     No family history on file. History  Substance Use Topics  . Smoking status: Former Games developer  . Smokeless tobacco: Not on file  . Alcohol Use: No   OB History   Grav Para Term Preterm Abortions TAB SAB Ect Mult Living                 Review of Systems  Constitutional:       Per HPI, otherwise negative  HENT:       Per HPI, otherwise negative  Respiratory:       Per HPI, otherwise negative  Cardiovascular:       Per HPI, otherwise negative  Gastrointestinal: Positive for nausea, vomiting, abdominal pain and diarrhea.  Endocrine:       Negative aside from HPI  Genitourinary:       Neg aside from HPI   Musculoskeletal:       Per HPI, otherwise negative  Skin: Negative.   Neurological: Negative for syncope.    Allergies  Garlic  Home Medications   Current Outpatient Rx  Name  Route  Sig  Dispense  Refill  . beclomethasone (QVAR) 40 MCG/ACT inhaler   Inhalation   Inhale 2 puffs into the lungs 2 (two) times daily.   1 Inhaler   1   . benztropine (COGENTIN) 1 MG tablet    Oral   Take 1 tablet (1 mg total) by mouth daily.   30 tablet   1   . levothyroxine (SYNTHROID, LEVOTHROID) 75 MCG tablet   Oral   Take 1 tablet (75 mcg total) by mouth daily.   30 tablet   1   . metoCLOPramide (REGLAN) 10 MG tablet   Oral   Take 1 tablet (10 mg total) by mouth every 6 (six) hours as needed (nausea/headache).   6 tablet   0   . mirtazapine (REMERON) 15 MG tablet   Oral   Take 1 tablet (15 mg total) by mouth at bedtime.   30 tablet   1   . ondansetron (ZOFRAN ODT) 8 MG disintegrating tablet      8mg  ODT q4 hours prn nausea   4 tablet   0   . oxyCODONE-acetaminophen (PERCOCET) 5-325 MG per tablet   Oral   Take 2 tablets by mouth every 4 (four) hours as needed for pain.   6 tablet   0   . risperidone (RISPERDAL) 4 MG tablet   Oral   Take 1 tablet (4 mg total) by mouth daily.   30 tablet   1    BP 151/80  Pulse  64  Temp(Src) 98.6 F (37 C) (Oral)  Resp 20  SpO2 100% Physical Exam  Nursing note and vitals reviewed. Constitutional:  Awake, alert, nontoxic appearance.  HENT:  Head: Atraumatic.  Eyes: Right eye exhibits no discharge. Left eye exhibits no discharge.  Neck: No tracheal deviation present.  Cardiovascular: Normal rate and regular rhythm.   Pulmonary/Chest: Effort normal. No stridor. No respiratory distress.  Abdominal: Soft. Bowel sounds are normal. She exhibits no distension and no mass. There is tenderness. There is guarding. There is no rebound.  Musculoskeletal: She exhibits no tenderness.  No gross deformities  Neurological: She is alert.  Mental status and motor strength appears baseline for patient and situation.  Skin: No rash noted.  Psychiatric: She has a normal mood and affect.    ED Course  Procedures (including critical care time) Labs Reviewed  CBC WITH DIFFERENTIAL  COMPREHENSIVE METABOLIC PANEL  LIPASE, BLOOD  URINALYSIS, ROUTINE W REFLEX MICROSCOPIC   No results found. No diagnosis found.   6:15  PM Patient ambulatory - looking much better  9:25 PM Patient substantially better. MDM  This patient with chronic abdominal pain presents with nausea, vomiting, diarrhea.  On exam she is awake and alert, but uncomfortable appearing.  She is afebrile.  Labs are reassuring, though there is evidence of ketonuria. Patient improved substantially with fluid rehydration, analgesics, antiemetics, and was discharged in stable condition to follow up with primary care physician and a gastroenterologist  Gerhard Munch, MD 08/06/12 2126  Gerhard Munch, MD 08/12/12 1553

## 2012-08-07 ENCOUNTER — Emergency Department (HOSPITAL_COMMUNITY): Payer: Medicare Other

## 2012-08-07 ENCOUNTER — Observation Stay (HOSPITAL_COMMUNITY)
Admission: EM | Admit: 2012-08-07 | Discharge: 2012-08-08 | Disposition: A | Discharge status: Home / Self Care | Payer: Medicare Other | Attending: Internal Medicine | Admitting: Internal Medicine

## 2012-08-07 ENCOUNTER — Encounter (HOSPITAL_COMMUNITY): Payer: Self-pay | Admitting: Emergency Medicine

## 2012-08-07 DIAGNOSIS — F411 Generalized anxiety disorder: Secondary | ICD-10-CM | POA: Insufficient documentation

## 2012-08-07 DIAGNOSIS — K7689 Other specified diseases of liver: Secondary | ICD-10-CM | POA: Insufficient documentation

## 2012-08-07 DIAGNOSIS — N390 Urinary tract infection, site not specified: Secondary | ICD-10-CM

## 2012-08-07 DIAGNOSIS — E039 Hypothyroidism, unspecified: Secondary | ICD-10-CM

## 2012-08-07 DIAGNOSIS — R1084 Generalized abdominal pain: Secondary | ICD-10-CM | POA: Diagnosis present

## 2012-08-07 DIAGNOSIS — F329 Major depressive disorder, single episode, unspecified: Secondary | ICD-10-CM

## 2012-08-07 DIAGNOSIS — N200 Calculus of kidney: Secondary | ICD-10-CM | POA: Insufficient documentation

## 2012-08-07 DIAGNOSIS — D259 Leiomyoma of uterus, unspecified: Secondary | ICD-10-CM | POA: Insufficient documentation

## 2012-08-07 DIAGNOSIS — R197 Diarrhea, unspecified: Secondary | ICD-10-CM | POA: Diagnosis present

## 2012-08-07 DIAGNOSIS — E876 Hypokalemia: Secondary | ICD-10-CM | POA: Insufficient documentation

## 2012-08-07 DIAGNOSIS — Z79899 Other long term (current) drug therapy: Secondary | ICD-10-CM | POA: Insufficient documentation

## 2012-08-07 DIAGNOSIS — R109 Unspecified abdominal pain: Principal | ICD-10-CM | POA: Insufficient documentation

## 2012-08-07 DIAGNOSIS — R112 Nausea with vomiting, unspecified: Secondary | ICD-10-CM | POA: Insufficient documentation

## 2012-08-07 HISTORY — DX: Hypothyroidism, unspecified: E03.9

## 2012-08-07 LAB — COMPREHENSIVE METABOLIC PANEL
Alkaline Phosphatase: 77 U/L (ref 39–117)
BUN: 7 mg/dL (ref 6–23)
CO2: 19 mEq/L (ref 19–32)
Chloride: 102 mEq/L (ref 96–112)
Creatinine, Ser: 0.91 mg/dL (ref 0.50–1.10)
GFR calc non Af Amer: 75 mL/min — ABNORMAL LOW (ref >90)
Potassium: 3.4 mEq/L — ABNORMAL LOW (ref 3.5–5.1)
Total Bilirubin: 0.6 mg/dL (ref 0.3–1.2)

## 2012-08-07 LAB — CBC WITH DIFFERENTIAL/PLATELET
Basophils Relative: 0 % (ref 0–1)
Eosinophils Relative: 1 % (ref 0–5)
HCT: 39.5 % (ref 36.0–46.0)
Hemoglobin: 13.3 g/dL (ref 12.0–15.0)
Lymphs Abs: 2.2 10*3/uL (ref 0.7–4.0)
MCV: 68.6 fL — ABNORMAL LOW (ref 78.0–100.0)
Monocytes Relative: 7 % (ref 3–12)
Neutro Abs: 6.6 10*3/uL (ref 1.7–7.7)
RBC: 5.76 MIL/uL — ABNORMAL HIGH (ref 3.87–5.11)
RDW: 15.3 % (ref 11.5–15.5)
WBC: 9.6 10*3/uL (ref 4.0–10.5)

## 2012-08-07 LAB — URINALYSIS, ROUTINE W REFLEX MICROSCOPIC
Bilirubin Urine: NEGATIVE
Glucose, UA: NEGATIVE mg/dL
Ketones, ur: 40 mg/dL — AB
Leukocytes, UA: NEGATIVE
Nitrite: NEGATIVE
Protein, ur: NEGATIVE mg/dL

## 2012-08-07 LAB — URINE MICROSCOPIC-ADD ON

## 2012-08-07 LAB — POCT PREGNANCY, URINE: Preg Test, Ur: NEGATIVE

## 2012-08-07 LAB — LIPASE, BLOOD: Lipase: 28 U/L (ref 11–59)

## 2012-08-07 LAB — CG4 I-STAT (LACTIC ACID): Lactic Acid, Venous: 1.34 mmol/L (ref 0.5–2.2)

## 2012-08-07 MED ORDER — IOHEXOL 300 MG/ML  SOLN
100.0000 mL | Freq: Once | INTRAMUSCULAR | Status: AC | PRN
  Administered 2012-08-07: 100 mL via INTRAVENOUS

## 2012-08-07 MED ORDER — IOHEXOL 300 MG/ML  SOLN
50.0000 mL | Freq: Once | INTRAMUSCULAR | Status: AC | PRN
  Administered 2012-08-07: 50 mL via ORAL

## 2012-08-07 MED ORDER — HYDROMORPHONE HCL PF 1 MG/ML IJ SOLN
0.5000 mg | INTRAMUSCULAR | Status: DC | PRN
  Administered 2012-08-08 (×3): 1 mg via INTRAVENOUS
  Filled 2012-08-07 (×3): qty 1

## 2012-08-07 MED ORDER — ONDANSETRON 4 MG PO TBDP
4.0000 mg | ORAL_TABLET | Freq: Once | ORAL | Status: AC
  Administered 2012-08-07: 4 mg via ORAL
  Filled 2012-08-07: qty 1

## 2012-08-07 MED ORDER — PROMETHAZINE HCL 25 MG/ML IJ SOLN
25.0000 mg | Freq: Once | INTRAMUSCULAR | Status: AC
  Administered 2012-08-07: 25 mg via INTRAVENOUS
  Filled 2012-08-07: qty 1

## 2012-08-07 MED ORDER — HYDROMORPHONE HCL PF 2 MG/ML IJ SOLN
2.0000 mg | Freq: Once | INTRAMUSCULAR | Status: AC
  Administered 2012-08-07: 2 mg via INTRAMUSCULAR
  Filled 2012-08-07: qty 1

## 2012-08-07 MED ORDER — POTASSIUM CHLORIDE CRYS ER 20 MEQ PO TBCR
40.0000 meq | EXTENDED_RELEASE_TABLET | Freq: Once | ORAL | Status: AC
  Administered 2012-08-07: 40 meq via ORAL
  Filled 2012-08-07: qty 2

## 2012-08-07 MED ORDER — HYDROMORPHONE HCL PF 1 MG/ML IJ SOLN: 1.0000 mg | Freq: Once | INTRAMUSCULAR | Status: DC

## 2012-08-07 MED ORDER — PROMETHAZINE HCL 25 MG/ML IJ SOLN
25.0000 mg | Freq: Once | INTRAMUSCULAR | Status: AC
  Administered 2012-08-07: 25 mg via INTRAMUSCULAR
  Filled 2012-08-07: qty 1

## 2012-08-07 MED ORDER — METOCLOPRAMIDE HCL 5 MG/ML IJ SOLN
10.0000 mg | Freq: Once | INTRAMUSCULAR | Status: AC
  Administered 2012-08-07: 10 mg via INTRAVENOUS
  Filled 2012-08-07: qty 2

## 2012-08-07 MED ORDER — OXYCODONE HCL 5 MG PO TABS: 5.0000 mg | ORAL_TABLET | ORAL | Status: DC | PRN

## 2012-08-07 MED ORDER — SODIUM CHLORIDE 0.9 % IV SOLN: INTRAVENOUS | Status: DC

## 2012-08-07 MED ORDER — BENZTROPINE MESYLATE 1 MG PO TABS
1.0000 mg | ORAL_TABLET | Freq: Every day | ORAL | Status: DC
  Administered 2012-08-08: 1 mg via ORAL
  Filled 2012-08-07 (×2): qty 1

## 2012-08-07 MED ORDER — ENOXAPARIN SODIUM 40 MG/0.4ML ~~LOC~~ SOLN
40.0000 mg | SUBCUTANEOUS | Status: DC
  Filled 2012-08-07 (×3): qty 0.4

## 2012-08-07 MED ORDER — SODIUM CHLORIDE 0.9 % IV BOLUS (SEPSIS)
1000.0000 mL | Freq: Once | INTRAVENOUS | Status: AC
  Administered 2012-08-07: 1000 mL via INTRAVENOUS

## 2012-08-07 MED ORDER — FLUTICASONE PROPIONATE HFA 44 MCG/ACT IN AERO
2.0000 | INHALATION_SPRAY | Freq: Two times a day (BID) | RESPIRATORY_TRACT | Status: DC
  Administered 2012-08-08: 2 via RESPIRATORY_TRACT
  Filled 2012-08-07: qty 10.6

## 2012-08-07 MED ORDER — ACETAMINOPHEN 325 MG PO TABS: 650.0000 mg | ORAL_TABLET | Freq: Four times a day (QID) | ORAL | Status: DC | PRN

## 2012-08-07 MED ORDER — ALUM & MAG HYDROXIDE-SIMETH 200-200-20 MG/5ML PO SUSP: 30.0000 mL | Freq: Four times a day (QID) | ORAL | Status: DC | PRN

## 2012-08-07 MED ORDER — SODIUM CHLORIDE 0.9 % IV SOLN
INTRAVENOUS | Status: DC
  Administered 2012-08-07: 23:00:00 via INTRAVENOUS

## 2012-08-07 MED ORDER — HYDROMORPHONE HCL PF 1 MG/ML IJ SOLN
1.0000 mg | Freq: Once | INTRAMUSCULAR | Status: AC
  Administered 2012-08-07: 1 mg via INTRAVENOUS
  Filled 2012-08-07: qty 1

## 2012-08-07 MED ORDER — LORAZEPAM 2 MG/ML IJ SOLN
1.0000 mg | Freq: Once | INTRAMUSCULAR | Status: AC
  Administered 2012-08-07: 1 mg via INTRAVENOUS
  Filled 2012-08-07: qty 1

## 2012-08-07 MED ORDER — ONDANSETRON HCL 4 MG PO TABS: 4.0000 mg | ORAL_TABLET | Freq: Four times a day (QID) | ORAL | Status: DC | PRN

## 2012-08-07 MED ORDER — RISPERIDONE 2 MG PO TABS
4.0000 mg | ORAL_TABLET | Freq: Every day | ORAL | Status: DC
  Filled 2012-08-07 (×2): qty 2

## 2012-08-07 MED ORDER — LEVOTHYROXINE SODIUM 75 MCG PO TABS
75.0000 ug | ORAL_TABLET | Freq: Every day | ORAL | Status: DC
  Administered 2012-08-08: 75 ug via ORAL
  Filled 2012-08-07 (×2): qty 1

## 2012-08-07 MED ORDER — ZOLPIDEM TARTRATE 5 MG PO TABS: 5.0000 mg | ORAL_TABLET | Freq: Every evening | ORAL | Status: DC | PRN

## 2012-08-07 MED ORDER — PROMETHAZINE HCL 25 MG/ML IJ SOLN
25.0000 mg | Freq: Once | INTRAMUSCULAR | Status: DC
  Filled 2012-08-07: qty 1

## 2012-08-07 MED ORDER — MIRTAZAPINE 15 MG PO TABS
15.0000 mg | ORAL_TABLET | Freq: Every day | ORAL | Status: DC
  Administered 2012-08-08: 15 mg via ORAL
  Filled 2012-08-07 (×2): qty 1

## 2012-08-07 MED ORDER — ONDANSETRON HCL 4 MG/2ML IJ SOLN
4.0000 mg | Freq: Four times a day (QID) | INTRAMUSCULAR | Status: DC | PRN
  Administered 2012-08-07 – 2012-08-08 (×2): 4 mg via INTRAVENOUS
  Filled 2012-08-07 (×2): qty 2

## 2012-08-07 MED ORDER — PROMETHAZINE HCL 25 MG/ML IJ SOLN: 25.0000 mg | Freq: Once | INTRAMUSCULAR | Status: DC

## 2012-08-07 MED ORDER — ACETAMINOPHEN 650 MG RE SUPP: 650.0000 mg | Freq: Four times a day (QID) | RECTAL | Status: DC | PRN

## 2012-08-07 NOTE — H&P (Addendum)
[facility] History and Physical  ERCILIA BETTINGER YNW:[id number] DOB: [birth year] DOA: 08/07/2012  Referring physician:  EDP PCP: Dorrene German, MD  Specialists:   Chief Complaint: Nausea,Vomiting, Diarrhea, and ABD Pain  HPI: Victoria Flores is a 47 y.o. female who presents to the ED with complaints of N+V+D and Generalized ABD Pain worsening over 4 days.  She reports that sh has had ABD Pain off and on since March 2014.  Her symptoms have worsened and she reports not being able to hold down foods or liquids for the past 24 hours.  She denies having any fevers or chills or hematemesis, or melena or hematochezia.  She also denies that anyone is also sick in her home with similar symptoms.    She was evaluated in the ED and a CT scan of the ABD was performed and was found to have non-acute findings, no signs of ileus or obstruction or inflammation.   She was referred for admission.      Review of Systems: The patient denies anorexia, fever, chills, headaches, weight loss, vision loss, diplopia, dizziness, decreased hearing, rhinitis, hoarseness, chest pain, syncope, dyspnea on exertion, peripheral edema, balance deficits, cough, hemoptysis, constipation, hematemesis, melena, hematochezia, severe indigestion/heartburn, dysuria, hematuria, incontinence, muscle weakness, suspicious skin lesions, transient blindness, difficulty walking, depression, unusual weight change, abnormal bleeding, enlarged lymph nodes, angioedema, and breast masses.    Past Medical History  Diagnosis Date  . Arthritis   . Prolapsed disk   . Heart murmur   . Sleep deprivation   . Depression   . Sickle cell trait     Past Surgical History  Procedure Laterality Date  . Cesarean section      Prior to Admission medications   Medication Sig Start Date End Date Taking? Authorizing Provider  beclomethasone (QVAR) 40 MCG/ACT inhaler Inhale 2 puffs into the lungs 2 (two) times daily. 12/19/11  Yes Renae Fickle, MD   benztropine (COGENTIN) 1 MG tablet Take 1 tablet (1 mg total) by mouth daily. 12/19/11  Yes Renae Fickle, MD  levothyroxine (SYNTHROID, LEVOTHROID) 75 MCG tablet Take 1 tablet (75 mcg total) by mouth daily. 12/19/11  Yes Renae Fickle, MD  metoCLOPramide (REGLAN) 10 MG tablet Take 1 tablet (10 mg total) by mouth every 6 (six) hours as needed (nausea/headache). 08/04/12  Yes Hurman Horn, MD  mirtazapine (REMERON) 15 MG tablet Take 1 tablet (15 mg total) by mouth at bedtime. 12/19/11  Yes Renae Fickle, MD  ondansetron (ZOFRAN-ODT) 8 MG disintegrating tablet Take 8 mg by mouth every 4 (four) hours as needed for nausea.   Yes Historical Provider, MD  oxyCODONE-acetaminophen (PERCOCET) 5-325 MG per tablet Take 2 tablets by mouth every 4 (four) hours as needed for pain. 08/04/12  Yes Hurman Horn, MD  risperidone (RISPERDAL) 4 MG tablet Take 1 tablet (4 mg total) by mouth daily. 12/19/11  Yes Renae Fickle, MD    Allergies  Allergen Reactions  . Garlic Diarrhea    Vomiting    Social History:  reports that she has quit smoking. She does not have any smokeless tobacco history on file. She reports that she does not drink alcohol or use illicit drugs.     Family History:    Diabetes in Brother  Breast Cancer in Sister  Physical Exam:  GEN:  Pleasant  Obese Well developed  47 y.o. African Ameican female  examined  and in no acute distress; cooperative with exam Filed Vitals:   08/07/12 1315 08/07/12 1853 08/07/12  2122  BP: 172/93 150/79 171/84  Pulse: 62 73 71  Temp: 99.2 F (37.3 C) 98.6 F (37 C)   TempSrc: Oral Oral   Resp: 18 16 16   SpO2: 100% 96% 96%   Blood pressure 171/84, pulse 71, temperature 98.6 F (37 C), temperature source Oral, resp. rate 16, last menstrual period 03/10/2012, SpO2 96.00%. PSYCH: SHe is alert and oriented x4; does not appear anxious does not appear depressed; affect is normal HEENT: Normocephalic and Atraumatic, Mucous membranes pink; PERRLA; EOM  intact; Fundi:  Benign;  No scleral icterus, Nares: Patent, Oropharynx: Clear, Fair Dentition, Neck:  FROM, no cervical lymphadenopathy nor thyromegaly or carotid bruit; no JVD; Breasts:: Not examined CHEST WALL: No tenderness CHEST: Normal respiration, clear to auscultation bilaterally HEART: Regular rate and rhythm; no murmurs rubs or gallops BACK: No kyphosis or scoliosis; no CVA tenderness ABDOMEN: Positive Bowel Sounds, Obese, Mildly tender diffusely, but No Rebound and No Guarding; no masses, no organomegaly, no pannus; no intertriginous candida. Rectal Exam: Not done EXTREMITIES: No cyanosis, clubbing or edema; no ulcerations. Genitalia: not examined PULSES: 2+ and symmetric SKIN: Normal hydration no rash or ulceration CNS: Cranial nerves 2-12 grossly intact no focal neurologic deficit    Labs on Admission:  Basic Metabolic Panel:  Recent Labs Lab 08/04/12 1402 08/06/12 1618 08/07/12 1536  NA 140 135 138  K 2.9* 2.8* 3.4*  CL 106 98 102  CO2 19 24 19   GLUCOSE 145* 102* 90  BUN 10 8 7   CREATININE 0.85 0.80 0.91  CALCIUM 9.8 9.8 9.6   Liver Function Tests:  Recent Labs Lab 08/04/12 1402 08/06/12 1618 08/07/12 1536  AST 13 14 22   ALT 12 12 17   ALKPHOS 72 69 77  BILITOT 0.5 0.6 0.6  PROT 7.6 7.7 8.0  ALBUMIN 3.9 3.9 4.1    Recent Labs Lab 08/04/12 1402 08/06/12 1618 08/07/12 1536  LIPASE 19 27 28    No results found for this basename: AMMONIA,  in the last 168 hours CBC:  Recent Labs Lab 08/04/12 1402 08/06/12 1618 08/07/12 1536  WBC 9.9 8.6 9.6  NEUTROABS 6.6 4.7 6.6  HGB 13.6 13.1 13.3  HCT 40.4 39.7 39.5  MCV 69.2* 68.6* 68.6*  PLT 278 305 278   Cardiac Enzymes: No results found for this basename: CKTOTAL, CKMB, CKMBINDEX, TROPONINI,  in the last 168 hours  BNP (last 3 results) No results found for this basename: PROBNP,  in the last 8760 hours CBG: No results found for this basename: GLUCAP,  in the last 168 hours  Radiological Exams  on Admission: Ct Abdomen Pelvis W Contrast  08/07/2012   *RADIOLOGY REPORT*  Clinical Data: Abdominal pain.  Nausea vomiting and diarrhea for 4 days.  CT ABDOMEN AND PELVIS WITH CONTRAST  Technique:  Multidetector CT imaging of the abdomen and pelvis was performed following the standard protocol during bolus administration of intravenous contrast.  Contrast: 50mL OMNIPAQUE IOHEXOL 300 MG/ML  SOLN, OMNIPAQUE IOHEXOL 300 MG/ML  SOLN  Comparison: 04/05/2012.  Findings: Basilar subsegmental atelectasis.  Portions of the bowel are under distended and evaluation limited however, there is no evidence of extraluminal bowel inflammatory process, free fluid or free air.  Specifically, no inflammation surrounds the elongated appendix.  Uterine fibroids.  No worrisome adnexal mass.  Fatty infiltration of the liver without focal worrisome mass.  No calcified gallstones.  Nonobstructing left lower pole 6 mm renal calculi.  Left upper pole 6 mm low density structure possibly a renal cyst although  too small to characterize.  No worrisome splenic, pancreatic or adrenal lesion.  No abdominal aortic aneurysm.  No bony destructive lesion.  No hernia detected.  IMPRESSION: Portions of the bowel are under distended and evaluation limited however, there is no evidence of extraluminal bowel inflammatory process, free fluid or free air.  Specifically, no inflammation surrounds the elongated appendix.  Uterine fibroids.  Fatty infiltration of the liver.  Nonobstructing left lower pole 6 mm renal calculi.   Original Report Authenticated By: Lacy Duverney, M.D.       Assessment/Plan Principal Problem:   Abdominal pain, diffuse Active Problems:   Nausea and vomiting in adult   Diarrhea   Hypokalemia    1.   ABD Pain-  Acute on Chronic Pain, Pain Control PRN, No pathology on CT scan to explain symptoms.    May need to pursue GI workup as outpt with her PCP.    2.   Nausea and Vomiting-   PRN IV anti-emetics.   IVFs for  rehydration.  May need GI Motility Study.     3.   Diarrhea-  Send Stool for C.diff PCR, Stool for C+S, and O+P:  Rx Pending Results.     4.   Hypokalemia- Mild, caused by #2 and #3.  Replete K+.   Monitor electrolytes.     5.   Hypothyroid- Continue synthroid, check TSH.       Code Status:      FULL CODE Family Communication:    Daughter at Bedside Disposition Plan:       Return to Home on discharge  Time spent: 23 Minutes  Ron Parker [facility] Pager [phone]  If 7PM-7AM, please contact night-coverage [email or web address] Password [facility] 08/07/2012, 9:24 PM

## 2012-08-07 NOTE — ED Notes (Signed)
Unable to obtain labs x2 attempts. Dark green obtained and used for [employer]

## 2012-08-07 NOTE — ED Notes (Signed)
Pt c/o abd pain, NVD x 4 days.  Was seen here for this yesterday.

## 2012-08-07 NOTE — ED Provider Notes (Signed)
History    CSN: [id number] Arrival date & time 08/07/12  1309  First MD Initiated Contact with Patient 08/07/12 1349     Chief Complaint  Patient presents with  . Nausea  . Emesis  . Diarrhea  . Abdominal Pain   (Consider location/radiation/quality/duration/timing/severity/associated sxs/prior Treatment) The history is provided by the patient. No language interpreter was used.  Victoria Flores is a 47 y/o F with PMHx of arthritis, heart murmur, depression, prolapsed disc, presenting to the ED, with daughter, with abdominal pain that has been ongoing for the past 4 days. Patient reported that the abdominal pain is generalized, described as if her stomach is "jumping" - reported pain, without radiation elsewhere. Patient stated that she is unable to keep anything down - fluids or liquids. Reported that she has been feeling nauseous constantly, cannot recall how many times she has vomited. Stated that with every bowel movement she has she is having diarrhea - unable to recall the number. Nothing makes the pain better or worse. Reported that she has not been using the medication prescribed to her because she cannot keep anything down, continues to just bring it all back up. Denied headache, melena, hematochezia, chest pain, shortness of breath, difficulty breathing, urinary symptoms, stomach surgery. Reported still having her gallbladder and appendix. PCP: Dr. Fleet Contras Has not followed up with GI    Past Medical History  Diagnosis Date  . Arthritis   . Prolapsed disk   . Heart murmur   . Sleep deprivation   . Depression   . Sickle cell trait    Past Surgical History  Procedure Laterality Date  . Cesarean section     No family history on file. History  Substance Use Topics  . Smoking status: Former Games developer  . Smokeless tobacco: Not on file  . Alcohol Use: No   OB History   Grav Para Term Preterm Abortions TAB SAB Ect Mult Living                 Review of Systems   Constitutional: Positive for chills. Negative for fever.  HENT: Negative for neck pain.   Eyes: Negative for visual disturbance.  Respiratory: Negative for cough, chest tightness and shortness of breath.   Cardiovascular: Negative for chest pain.  Gastrointestinal: Positive for nausea, vomiting, abdominal pain and diarrhea. Negative for constipation, blood in stool and anal bleeding.  Genitourinary: Negative for decreased urine volume and difficulty urinating.  Musculoskeletal: Negative for back pain.  Neurological: Negative for dizziness, light-headedness and headaches.  All other systems reviewed and are negative.    Allergies  Garlic  Home Medications   Current Outpatient Rx  Name  Route  Sig  Dispense  Refill  . beclomethasone (QVAR) 40 MCG/ACT inhaler   Inhalation   Inhale 2 puffs into the lungs 2 (two) times daily.   1 Inhaler   1   . benztropine (COGENTIN) 1 MG tablet   Oral   Take 1 tablet (1 mg total) by mouth daily.   30 tablet   1   . levothyroxine (SYNTHROID, LEVOTHROID) 75 MCG tablet   Oral   Take 1 tablet (75 mcg total) by mouth daily.   30 tablet   1   . metoCLOPramide (REGLAN) 10 MG tablet   Oral   Take 1 tablet (10 mg total) by mouth every 6 (six) hours as needed (nausea/headache).   6 tablet   0   . mirtazapine (REMERON) 15 MG tablet  Oral   Take 1 tablet (15 mg total) by mouth at bedtime.   30 tablet   1   . ondansetron (ZOFRAN-ODT) 8 MG disintegrating tablet   Oral   Take 8 mg by mouth every 4 (four) hours as needed for nausea.         Marland Kitchen oxyCODONE-acetaminophen (PERCOCET) 5-325 MG per tablet   Oral   Take 2 tablets by mouth every 4 (four) hours as needed for pain.   6 tablet   0   . risperidone (RISPERDAL) 4 MG tablet   Oral   Take 1 tablet (4 mg total) by mouth daily.   30 tablet   1    BP 150/79  Pulse 73  Temp(Src) 98.6 F (37 C) (Oral)  Resp 16  SpO2 96%  LMP 03/10/2012 Physical Exam  Nursing note and vitals  reviewed. Constitutional: She is oriented to person, place, and time. She appears well-developed and well-nourished. No distress.  HENT:  Head: Normocephalic and atraumatic.  Mouth/Throat: Oropharynx is clear and moist.  Eyes: Conjunctivae and EOM are normal. Pupils are equal, round, and reactive to light. Right eye exhibits no discharge. Left eye exhibits no discharge.  Neck: Normal range of motion. Neck supple.  Cardiovascular: Normal rate, regular rhythm and normal heart sounds.  Exam reveals no friction rub.   No murmur heard. Pulses:      Radial pulses are 2+ on the right side, and 2+ on the left side.       Dorsalis pedis pulses are 2+ on the right side, and 2+ on the left side.  Pulmonary/Chest: Effort normal and breath sounds normal. No respiratory distress. She has no wheezes. She has no rales.  Abdominal: Soft. Bowel sounds are normal. She exhibits no distension. There is generalized tenderness. There is no rigidity, no rebound, no guarding, no CVA tenderness and no tenderness at McBurney's point.  Pain upon palpation to the abdomen, negative focal tenderness noted. Negative sign of peritonitis. Negative McBurney's point. Negative psoas and obturator.   Lymphadenopathy:    She has no cervical adenopathy.  Neurological: She is alert and oriented to person, place, and time. She exhibits normal muscle tone. Coordination normal.  Skin: Skin is warm and dry. No rash noted. She is not diaphoretic. No erythema.  Psychiatric: She has a normal mood and affect. Her behavior is normal. Thought content normal.    ED Course  Procedures (including critical care time)  5:42PM Patient reported discomfort and abdominal pain every 4-6 months, gets excruciating abdominal pain - stated that this just started this past year. Discussed with patient the labs, discussed with patient to get CT scan regarding discomfort.   7:15PM Discussed findings on CT scan with patient. Discussed with patient that PO  fluid challenge and crackers are going to be administered. Patient continues to complain about mild discomfort to the abdomen and nausea. Zofran PO administered.  8:14PM Checked up on patient - stated that she was unable to tolerate PO challenge -stated that she feels extremely sick and that her stomach is still hurting her. Reported that her discomfort is still an 8/10 - stated that the pain worsen when she tried to drink fluids. Generalized discomfort upon palpation to the abdomen.  8:57PM Spoke with Dr. Mort Sawyers - admission for observation to MedSurg.    Labs Reviewed  CBC WITH DIFFERENTIAL - Abnormal; Notable for the following:    RBC 5.76 (*)    MCV 68.6 (*)    MCH 23.1 (*)  All other components within normal limits  COMPREHENSIVE METABOLIC PANEL - Abnormal; Notable for the following:    Potassium 3.4 (*)    GFR calc non Af Amer 75 (*)    GFR calc Af Amer 87 (*)    All other components within normal limits  URINALYSIS, ROUTINE W REFLEX MICROSCOPIC - Abnormal; Notable for the following:    APPearance CLOUDY (*)    Hgb urine dipstick SMALL (*)    Ketones, ur 40 (*)    All other components within normal limits  LIPASE, BLOOD  URINE MICROSCOPIC-ADD ON  CG4 I-STAT (LACTIC ACID)  POCT PREGNANCY, URINE   Ct Abdomen Pelvis W Contrast  08/07/2012   *RADIOLOGY REPORT*  Clinical Data: Abdominal pain.  Nausea vomiting and diarrhea for 4 days.  CT ABDOMEN AND PELVIS WITH CONTRAST  Technique:  Multidetector CT imaging of the abdomen and pelvis was performed following the standard protocol during bolus administration of intravenous contrast.  Contrast: 50mL OMNIPAQUE IOHEXOL 300 MG/ML  SOLN, OMNIPAQUE IOHEXOL 300 MG/ML  SOLN  Comparison: 04/05/2012.  Findings: Basilar subsegmental atelectasis.  Portions of the bowel are under distended and evaluation limited however, there is no evidence of extraluminal bowel inflammatory process, free fluid or free air.  Specifically, no inflammation  surrounds the elongated appendix.  Uterine fibroids.  No worrisome adnexal mass.  Fatty infiltration of the liver without focal worrisome mass.  No calcified gallstones.  Nonobstructing left lower pole 6 mm renal calculi.  Left upper pole 6 mm low density structure possibly a renal cyst although too small to characterize.  No worrisome splenic, pancreatic or adrenal lesion.  No abdominal aortic aneurysm.  No bony destructive lesion.  No hernia detected.  IMPRESSION: Portions of the bowel are under distended and evaluation limited however, there is no evidence of extraluminal bowel inflammatory process, free fluid or free air.  Specifically, no inflammation surrounds the elongated appendix.  Uterine fibroids.  Fatty infiltration of the liver.  Nonobstructing left lower pole 6 mm renal calculi.   Original Report Authenticated By: Lacy Duverney, M.D.   1. Abdominal pain   2. Nausea and vomiting     MDM  Patient presenting to the ED with abdominal pain that has been ongoing for the past 4 days without relief - associated nausea, vomiting, diarrhea. Patient has been seen in the hospital numerous times for the same complaint. Was seen in the ED on 08/04/2012 and 08/06/2012 - was treated with fluids, pain medications and anti-emetics. CT scan of abdomen and pelvis with IV contrast performed 03/2012 with negative findings noted. Urine pregnancy negative. Small amount of blood in urine with mild ketones. Negative elevation in lactic acid. CBC negative findings. CMP mildly low potassium - potassium PO given. Lipase negative elevation. This is the patient's third time in the ED for the same complaints. Ordered CT scan of abdomen and pelvis with contrast. CT scan noted uterine fibroids, fatty infiltration of the liver, non-obstructing left lower pole 6 mm renal calculi - negative inflammatory process noted - negative free fluid or free air.  Negative acute findings noted to basic labs and imaging. Unable to control  patient's discomfort. After numerous rounds of pain medications and anti-emetics both IV and PO patient continues to have abdominal tenderness upon palpation - generalized. Stated that trying to drink fluids made the discomfort worsen and made her feel even more nauseous. Discussed case with Dr. Randel Books - saw patient - agreed for admission. Spoke with Dr. Mort Sawyers, Internal  Medicine, patient to be admitted to MedSurg for observation. Discussed plan for admission with patient - patient agreed to plan of care.    Raymon Mutton, PA-C 08/07/12 2105

## 2012-08-07 NOTE — ED Notes (Signed)
Unsuccessful IV attempt x 1.  Asked Consulting civil engineer to attempt.

## 2012-08-08 ENCOUNTER — Encounter (HOSPITAL_COMMUNITY): Payer: Self-pay | Admitting: *Deleted

## 2012-08-08 DIAGNOSIS — F411 Generalized anxiety disorder: Secondary | ICD-10-CM | POA: Diagnosis present

## 2012-08-08 LAB — BASIC METABOLIC PANEL
BUN: 5 mg/dL — ABNORMAL LOW (ref 6–23)
CO2: 23 mEq/L (ref 19–32)
Glucose, Bld: 93 mg/dL (ref 70–99)
Potassium: 3.1 mEq/L — ABNORMAL LOW (ref 3.5–5.1)
Sodium: 137 mEq/L (ref 135–145)

## 2012-08-08 LAB — CBC
HCT: 36.2 % (ref 36.0–46.0)
Hemoglobin: 12 g/dL (ref 12.0–15.0)
MCH: 22.8 pg — ABNORMAL LOW (ref 26.0–34.0)
MCV: 68.7 fL — ABNORMAL LOW (ref 78.0–100.0)
RBC: 5.27 MIL/uL — ABNORMAL HIGH (ref 3.87–5.11)

## 2012-08-08 MED ORDER — PAROXETINE HCL 20 MG PO TABS
20.0000 mg | ORAL_TABLET | Freq: Every day | ORAL | Status: DC
  Administered 2012-08-08: 20 mg via ORAL
  Filled 2012-08-08: qty 1

## 2012-08-08 MED ORDER — MAGNESIUM OXIDE 400 (241.3 MG) MG PO TABS
400.0000 mg | ORAL_TABLET | Freq: Two times a day (BID) | ORAL | Status: DC
  Administered 2012-08-08: 400 mg via ORAL
  Filled 2012-08-08 (×2): qty 1

## 2012-08-08 MED ORDER — POTASSIUM CHLORIDE CRYS ER 20 MEQ PO TBCR
40.0000 meq | EXTENDED_RELEASE_TABLET | Freq: Once | ORAL | Status: AC
  Administered 2012-08-08: 40 meq via ORAL
  Filled 2012-08-08: qty 2

## 2012-08-08 MED ORDER — PAROXETINE HCL 20 MG PO TABS: 20.0000 mg | ORAL_TABLET | Freq: Every day | ORAL | Status: DC

## 2012-08-08 NOTE — ED Provider Notes (Signed)
Medical screening examination/treatment/procedure(s) were conducted as a shared visit with non-physician practitioner(s) and myself.  I personally evaluated the patient during the encounter  Acute on chronic abdominal pain, 3rd visit this week. States unable to keep anything down. Moist mucus membranes.  Diffuse tenderness to palpation without peritoneal signs. CT scan unrevealing.  Glynn Octave, MD 08/08/12 0030

## 2012-08-08 NOTE — Progress Notes (Signed)
[facility] PROGRESS NOTE    Assessment/Plan:  GAD (generalized anxiety disorder)/  *Abdominal pain, diffuse/  Diarrhea/  Nausea and vomiting in adult: - no fever, CB(except for anemia), c-met and CT abd and pelvis are unremarkable. - no diarrhea or vomiting in house. - c. Dif pending. Avoid narcotics. - cont zofran, dc Remeron start paxil. - if no further BM or c.dif negative will dc home. - pt sister recently passed away, and this seem to have affected her significantly. She has no suicidal thought. She also has a lot of stressor at home. - SW for syq as an outpatinet.  Hypokalemia: - replete K and mag.    Code Status: full Family Communication: none  Disposition Plan: home   Consultants:  none  Procedures:  none  Antibiotics:  none (indicate start date, and stop date if known)  HPI/Subjective: Labile emotion. Teary. complaning that she cannot stay in her sisters house.  Objective: Filed Vitals:   08/07/12 2135 08/07/12 2225 08/08/12 0228 08/08/12 0637  BP:  152/78 141/82 151/87  Pulse:  71 75 81  Temp: 98.6 F (37 C) 99.2 F (37.3 C) 98.4 F (36.9 C) 98.1 F (36.7 C)  TempSrc: Oral Oral Oral Oral  Resp:  15 16 18   Height:  5\' 2"  (1.575 m)    Weight:  66.4 kg (146 lb 6.2 oz)    SpO2:  100% 93% 98%    Intake/Output Summary (Last 24 hours) at 08/08/12 0800 Last data filed at 08/08/12 1610  Gross per 24 hour  Intake      0 ml  Output    600 ml  Net   -600 ml   Filed Weights   08/07/12 2225  Weight: 66.4 kg (146 lb 6.2 oz)    Exam:  General: Alert, awake, oriented x3, in no acute distress.  HEENT: No bruits, no goiter.  Heart: Regular rate and rhythm, without murmurs, rubs, gallops.  Lungs: Good air movement, bilateral air movement.  Abdomen: Soft, nontender, nondistended, positive bowel sounds.  Neuro: Grossly intact, nonfocal.   Data Reviewed: Basic Metabolic Panel:  Recent Labs Lab 08/04/12 1402 08/06/12 1618 08/07/12 1536  08/08/12 0445  NA 140 135 138 137  K 2.9* 2.8* 3.4* 3.1*  CL 106 98 102 103  CO2 19 24 19 23   GLUCOSE 145* 102* 90 93  BUN 10 8 7  5*  CREATININE 0.85 0.80 0.91 0.80  CALCIUM 9.8 9.8 9.6 8.7   Liver Function Tests:  Recent Labs Lab 08/04/12 1402 08/06/12 1618 08/07/12 1536  AST 13 14 22   ALT 12 12 17   ALKPHOS 72 69 77  BILITOT 0.5 0.6 0.6  PROT 7.6 7.7 8.0  ALBUMIN 3.9 3.9 4.1    Recent Labs Lab 08/04/12 1402 08/06/12 1618 08/07/12 1536  LIPASE 19 27 28    No results found for this basename: AMMONIA,  in the last 168 hours CBC:  Recent Labs Lab 08/04/12 1402 08/06/12 1618 08/07/12 1536 08/08/12 0445  WBC 9.9 8.6 9.6 8.8  NEUTROABS 6.6 4.7 6.6  --   HGB 13.6 13.1 13.3 12.0  HCT 40.4 39.7 39.5 36.2  MCV 69.2* 68.6* 68.6* 68.7*  PLT 278 305 278 221   Cardiac Enzymes: No results found for this basename: CKTOTAL, CKMB, CKMBINDEX, TROPONINI,  in the last 168 hours BNP (last 3 results) No results found for this basename: PROBNP,  in the last 8760 hours CBG: No results found for this basename: GLUCAP,  in the last 168 hours  Recent Results (from the past 240 hour(s))  URINE CULTURE     Status: None   Collection Time    08/04/12  2:19 PM      Result Value Range Status   Specimen Description URINE, RANDOM   Final   Special Requests NONE   Final   Culture  Setup Time 08/04/2012 22:02   Final   Colony Count NO GROWTH   Final   Culture NO GROWTH   Final   Report Status 08/05/2012 FINAL   Final     Studies: Ct Abdomen Pelvis W Contrast  08/07/2012   *RADIOLOGY REPORT*  Clinical Data: Abdominal pain.  Nausea vomiting and diarrhea for 4 days.  CT ABDOMEN AND PELVIS WITH CONTRAST  Technique:  Multidetector CT imaging of the abdomen and pelvis was performed following the standard protocol during bolus administration of intravenous contrast.  Contrast: 50mL OMNIPAQUE IOHEXOL 300 MG/ML  SOLN, OMNIPAQUE IOHEXOL 300 MG/ML  SOLN  Comparison: 04/05/2012.  Findings:  Basilar subsegmental atelectasis.  Portions of the bowel are under distended and evaluation limited however, there is no evidence of extraluminal bowel inflammatory process, free fluid or free air.  Specifically, no inflammation surrounds the elongated appendix.  Uterine fibroids.  No worrisome adnexal mass.  Fatty infiltration of the liver without focal worrisome mass.  No calcified gallstones.  Nonobstructing left lower pole 6 mm renal calculi.  Left upper pole 6 mm low density structure possibly a renal cyst although too small to characterize.  No worrisome splenic, pancreatic or adrenal lesion.  No abdominal aortic aneurysm.  No bony destructive lesion.  No hernia detected.  IMPRESSION: Portions of the bowel are under distended and evaluation limited however, there is no evidence of extraluminal bowel inflammatory process, free fluid or free air.  Specifically, no inflammation surrounds the elongated appendix.  Uterine fibroids.  Fatty infiltration of the liver.  Nonobstructing left lower pole 6 mm renal calculi.   Original Report Authenticated By: Lacy Duverney, M.D.    Scheduled Meds: . sodium chloride   Intravenous STAT  . benztropine  1 mg Oral Daily  . enoxaparin (LOVENOX) injection  40 mg Subcutaneous Q24H  . fluticasone  2 puff Inhalation BID  . levothyroxine  75 mcg Oral QAC breakfast  . mirtazapine  15 mg Oral QHS  . PARoxetine  20 mg Oral Daily  . potassium chloride SA  40 mEq Oral Once  . risperidone  4 mg Oral Daily   Continuous Infusions: . sodium chloride 75 mL/hr at 08/07/12 2253     Marinda Elk  [facility] Pager [phone]. If 8PM-8AM, please contact night-coverage at [email or web address], password [facility] 08/08/2012, 8:00 AM  LOS: 1 day

## 2012-08-08 NOTE — Progress Notes (Signed)
Pt given a copy of the discharge instructions and was going to go over it with her but pt refused.She stated she really didn't care what the discharge instructions were. I told her , the prescription was called into [facility]. Pt was voicing all the stress she has going on at home with her children, grandchildren.mother and nephew. They all live at her home and she was stating it was too much for her with all the confusion. Pt was going to take a taxi home. She also stated she had been seeing a counselor for depression for the last 4 years but it was not helping. Social worker in to speak with pt. Diet information given to pt and also stress management information given to pt.

## 2012-08-08 NOTE — Progress Notes (Signed)
Clinical Social Work Department BRIEF PSYCHOSOCIAL ASSESSMENT 08/08/2012  Patient:  Victoria Flores, Victoria Flores     Account Number:  [id number]     Admit date:  08/07/2012  Clinical Social Worker:  Doroteo Glassman  Date/Time:  08/08/2012 11:52 AM  Referred by:  Physician  Date Referred:  08/08/2012 Referred for  Behavioral Health Issues   Other Referral:   Interview type:  Patient Other interview type:    PSYCHOSOCIAL DATA Living Status:  FAMILY Admitted from facility:   Level of care:   Primary support name:  Brennan Bailey Primary support relationship to patient:  SIBLING Degree of support available:   unknown    CURRENT CONCERNS Current Concerns  Behavioral Health Issues   Other Concerns:    SOCIAL WORK ASSESSMENT / PLAN Met with Pt to discuss outpt providers.    Pt frustrated due to contiued nausea and malaise.  Pt stated that she's tired of coming back to the hospital and that she wants to feel better.    CSW offered supportive counseling and discussed that Pt needs to care for her mental health needs, as well as her medical needs.  Pt agreed and was receptive to information on [place] and area mental health providers.    Pt being d/c'd today.    CSW thanked Pt for her time.   Assessment/plan status:  No Further Intervention Required Other assessment/ plan:   Information/referral to community resources:   [employer].    PATIENT'S/FAMILY'S RESPONSE TO PLAN OF CARE: Pt was frustrated with her health but thanked CSW for mental health resources.   Providence Crosby, LCSWA Clinical Social Work [phone]

## 2012-08-08 NOTE — Discharge Summary (Signed)
Physician Discharge Summary  Victoria Flores JYN:[id number] DOB: 1965-05-25 DOA: 08/07/2012  PCP: Dorrene German, MD  Admit date: 08/07/2012 Discharge date: 08/08/2012  Time spent: 35 minutes  Recommendations for Outpatient Follow-up:  1. Follow up with PCP and psyq for outpatient  Discharge Diagnoses:  Principal Problem:   Abdominal pain, diffuse Active Problems:   Nausea and vomiting in adult   Diarrhea   Hypokalemia   GAD (generalized anxiety disorder)   Discharge Condition: stable  Diet recommendation: regular  Filed Weights   08/07/12 2225  Weight: 66.4 kg (146 lb 6.2 oz)    History of present illness:  47 y.o. female who presents to the ED with complaints of N+V+D and Generalized ABD Pain worsening over 4 days. She reports that sh has had ABD Pain off and on since March 2014. Her symptoms have worsened and she reports not being able to hold down foods or liquids for the past 24 hours. She denies having any fevers or chills or hematemesis, or melena or hematochezia. She also denies that anyone is also sick in her home with similar symptoms. She was evaluated in the ED and a CT scan of the ABD was performed and was found to have non-acute findings, no signs of ileus or obstruction or inflammation. She was referred for admission.    Hospital Course:  GAD (generalized anxiety disorder)/ *Abdominal pain, diffuse/ Diarrhea/ Nausea and vomiting in adult:  - no fever, CB(except for anemia), c-met (except for below) and CT abd and pelvis are unremarkable.  - no diarrhea or vomiting in house.  - c. Dif pending. Avoid narcotics.  - cont zofran, dc Remeron start paxil.  - if no further BM or c.dif negative will dc home.  - pt sister recently passed away, and this seem to have affected her significantly. She has no suicidal thought. She also has a lot of stressor at home.  - SW consult for syq as an outpatinet.   Hypokalemia:  - replete K and mag.    Procedures:  Ct abd and  pelvis  Consultations:  none  Discharge Exam: Filed Vitals:   08/07/12 2135 08/07/12 2225 08/08/12 0228 08/08/12 0637  BP:  152/78 141/82 151/87  Pulse:  71 75 81  Temp: 98.6 F (37 C) 99.2 F (37.3 C) 98.4 F (36.9 C) 98.1 F (36.7 C)  TempSrc: Oral Oral Oral Oral  Resp:  15 16 18   Height:  5\' 2"  (1.575 m)    Weight:  66.4 kg (146 lb 6.2 oz)    SpO2:  100% 93% 98%    General: see progress note   Discharge Instructions  Discharge Orders   Future Orders Complete By Expires     Diet - low sodium heart healthy  As directed     Increase activity slowly  As directed         Medication List    STOP taking these medications       mirtazapine 15 MG tablet  Commonly known as:  REMERON      TAKE these medications       beclomethasone 40 MCG/ACT inhaler  Commonly known as:  QVAR  Inhale 2 puffs into the lungs 2 (two) times daily.     benztropine 1 MG tablet  Commonly known as:  COGENTIN  Take 1 tablet (1 mg total) by mouth daily.     levothyroxine 75 MCG tablet  Commonly known as:  SYNTHROID, LEVOTHROID  Take 1 tablet (75 mcg total) by  mouth daily.     metoCLOPramide 10 MG tablet  Commonly known as:  REGLAN  Take 1 tablet (10 mg total) by mouth every 6 (six) hours as needed (nausea/headache).     ondansetron 8 MG disintegrating tablet  Commonly known as:  ZOFRAN-ODT  Take 8 mg by mouth every 4 (four) hours as needed for nausea.     oxyCODONE-acetaminophen 5-325 MG per tablet  Commonly known as:  PERCOCET  Take 2 tablets by mouth every 4 (four) hours as needed for pain.     PARoxetine 20 MG tablet  Commonly known as:  PAXIL  Take 1 tablet (20 mg total) by mouth daily.     risperidone 4 MG tablet  Commonly known as:  RISPERDAL  Take 1 tablet (4 mg total) by mouth daily.       Allergies  Allergen Reactions  . Garlic Diarrhea    Vomiting       Follow-up Information   Follow up with AVBUERE,EDWIN A, MD In 3 weeks. (hospital follow up)    Contact  information:   [address] [place] [address] [phone]        The results of significant diagnostics from this hospitalization (including imaging, microbiology, ancillary and laboratory) are listed below for reference.    Significant Diagnostic Studies: Ct Abdomen Pelvis W Contrast  08/07/2012   *RADIOLOGY REPORT*  Clinical Data: Abdominal pain.  Nausea vomiting and diarrhea for 4 days.  CT ABDOMEN AND PELVIS WITH CONTRAST  Technique:  Multidetector CT imaging of the abdomen and pelvis was performed following the standard protocol during bolus administration of intravenous contrast.  Contrast: 50mL OMNIPAQUE IOHEXOL 300 MG/ML  SOLN, OMNIPAQUE IOHEXOL 300 MG/ML  SOLN  Comparison: 04/05/2012.  Findings: Basilar subsegmental atelectasis.  Portions of the bowel are under distended and evaluation limited however, there is no evidence of extraluminal bowel inflammatory process, free fluid or free air.  Specifically, no inflammation surrounds the elongated appendix.  Uterine fibroids.  No worrisome adnexal mass.  Fatty infiltration of the liver without focal worrisome mass.  No calcified gallstones.  Nonobstructing left lower pole 6 mm renal calculi.  Left upper pole 6 mm low density structure possibly a renal cyst although too small to characterize.  No worrisome splenic, pancreatic or adrenal lesion.  No abdominal aortic aneurysm.  No bony destructive lesion.  No hernia detected.  IMPRESSION: Portions of the bowel are under distended and evaluation limited however, there is no evidence of extraluminal bowel inflammatory process, free fluid or free air.  Specifically, no inflammation surrounds the elongated appendix.  Uterine fibroids.  Fatty infiltration of the liver.  Nonobstructing left lower pole 6 mm renal calculi.   Original Report Authenticated By: Lacy Duverney, M.D.    Microbiology: Recent Results (from the past 240 hour(s))  URINE CULTURE     Status: None   Collection Time     08/04/12  2:19 PM      Result Value Range Status   Specimen Description URINE, RANDOM   Final   Special Requests NONE   Final   Culture  Setup Time 08/04/2012 22:02   Final   Colony Count NO GROWTH   Final   Culture NO GROWTH   Final   Report Status 08/05/2012 FINAL   Final     Labs: Basic Metabolic Panel:  Recent Labs Lab 08/04/12 1402 08/06/12 1618 08/07/12 1536 08/08/12 0445  NA 140 135 138 137  K 2.9* 2.8* 3.4* 3.1*  CL 106  98 102 103  CO2 19 24 19 23   GLUCOSE 145* 102* 90 93  BUN 10 8 7  5*  CREATININE 0.85 0.80 0.91 0.80  CALCIUM 9.8 9.8 9.6 8.7   Liver Function Tests:  Recent Labs Lab 08/04/12 1402 08/06/12 1618 08/07/12 1536  AST 13 14 22   ALT 12 12 17   ALKPHOS 72 69 77  BILITOT 0.5 0.6 0.6  PROT 7.6 7.7 8.0  ALBUMIN 3.9 3.9 4.1    Recent Labs Lab 08/04/12 1402 08/06/12 1618 08/07/12 1536  LIPASE 19 27 28    No results found for this basename: AMMONIA,  in the last 168 hours CBC:  Recent Labs Lab 08/04/12 1402 08/06/12 1618 08/07/12 1536 08/08/12 0445  WBC 9.9 8.6 9.6 8.8  NEUTROABS 6.6 4.7 6.6  --   HGB 13.6 13.1 13.3 12.0  HCT 40.4 39.7 39.5 36.2  MCV 69.2* 68.6* 68.6* 68.7*  PLT 278 305 278 221   Cardiac Enzymes: No results found for this basename: CKTOTAL, CKMB, CKMBINDEX, TROPONINI,  in the last 168 hours BNP: BNP (last 3 results) No results found for this basename: PROBNP,  in the last 8760 hours CBG: No results found for this basename: GLUCAP,  in the last 168 hours     Signed:  Marinda Elk  [facility] 08/08/2012, 8:08 AM

## 2012-08-09 LAB — POCT PREGNANCY, URINE: Preg Test, Ur: NEGATIVE

## 2012-08-16 ENCOUNTER — Ambulatory Visit (INDEPENDENT_AMBULATORY_CARE_PROVIDER_SITE_OTHER): Payer: Medicare Other | Admitting: Obstetrics

## 2012-08-16 ENCOUNTER — Encounter: Payer: Self-pay | Admitting: Obstetrics

## 2012-08-16 VITALS — BP 125/91 | HR 92 | Temp 99.2°F | Ht 62.0 in | Wt 140.2 lb

## 2012-08-16 DIAGNOSIS — K589 Irritable bowel syndrome without diarrhea: Secondary | ICD-10-CM

## 2012-08-16 DIAGNOSIS — R1013 Epigastric pain: Secondary | ICD-10-CM

## 2012-08-16 DIAGNOSIS — D649 Anemia, unspecified: Secondary | ICD-10-CM

## 2012-08-16 DIAGNOSIS — N760 Acute vaginitis: Secondary | ICD-10-CM

## 2012-08-16 NOTE — Progress Notes (Signed)
.   Subjective:     Victoria Flores is a 47 y.o. female here for a problem exam.  Current complaints: She had a CT scan on 08/07/12 that shows fibriods.  Personal health questionnaire reviewed: yes.   Gynecologic History Patient's last menstrual period was 08/09/2012. Contraception: none Last Pap: 2013. Results were: normal Last mammogram: 2013. Results were: normal  Obstetric History OB History   Grav Para Term Preterm Abortions TAB SAB Ect Mult Living                   The following portions of the patient's history were reviewed and updated as appropriate: allergies, current medications, past family history, past medical history, past social history, past surgical history and problem list.  Review of Systems Pertinent items are noted in HPI.    Objective:    Abdomen: normal findings: soft, non-tender Pelvic: cervix normal in appearance, external genitalia normal, no adnexal masses or tenderness, no cervical motion tenderness, rectovaginal septum normal, uterus normal size, shape, and consistency and vagina normal without discharge    Assessment:      Constipation.  Abdominal pain.  R/O IBS.   Plan:    Education reviewed: IBS. Follow up in: 3 months. Referred to GI

## 2012-08-17 ENCOUNTER — Encounter: Payer: Self-pay | Admitting: Obstetrics

## 2012-08-17 DIAGNOSIS — R102 Pelvic and perineal pain: Secondary | ICD-10-CM | POA: Insufficient documentation

## 2012-08-17 DIAGNOSIS — N760 Acute vaginitis: Secondary | ICD-10-CM | POA: Insufficient documentation

## 2012-08-17 DIAGNOSIS — R1013 Epigastric pain: Secondary | ICD-10-CM | POA: Insufficient documentation

## 2012-08-17 DIAGNOSIS — K589 Irritable bowel syndrome without diarrhea: Secondary | ICD-10-CM | POA: Insufficient documentation

## 2012-08-17 DIAGNOSIS — D649 Anemia, unspecified: Secondary | ICD-10-CM | POA: Insufficient documentation

## 2012-08-17 LAB — WET PREP BY MOLECULAR PROBE
Candida species: NEGATIVE
Trichomonas vaginosis: NEGATIVE

## 2012-08-17 LAB — COMPREHENSIVE METABOLIC PANEL
Albumin: 3.9 g/dL (ref 3.5–5.2)
BUN: 5 mg/dL — ABNORMAL LOW (ref 6–23)
Calcium: 9.3 mg/dL (ref 8.4–10.5)
Chloride: 105 mEq/L (ref 96–112)
Creat: 0.81 mg/dL (ref 0.50–1.10)
Glucose, Bld: 65 mg/dL — ABNORMAL LOW (ref 70–99)
Potassium: 3.7 mEq/L (ref 3.5–5.3)

## 2012-08-17 LAB — CBC
HCT: 39.3 % (ref 36.0–46.0)
Hemoglobin: 12.6 g/dL (ref 12.0–15.0)
MCHC: 32.1 g/dL (ref 30.0–36.0)
MCV: 70.1 fL — ABNORMAL LOW (ref 78.0–100.0)
WBC: 5.5 10*3/uL (ref 4.0–10.5)

## 2012-08-18 ENCOUNTER — Encounter: Payer: Self-pay | Admitting: Gastroenterology

## 2012-09-01 ENCOUNTER — Other Ambulatory Visit: Payer: Self-pay | Admitting: *Deleted

## 2012-09-01 DIAGNOSIS — N760 Acute vaginitis: Secondary | ICD-10-CM

## 2012-09-01 DIAGNOSIS — B9689 Other specified bacterial agents as the cause of diseases classified elsewhere: Secondary | ICD-10-CM

## 2012-09-01 MED ORDER — METRONIDAZOLE 500 MG PO TABS: 500.0000 mg | ORAL_TABLET | Freq: Two times a day (BID) | ORAL | Status: DC

## 2012-09-09 ENCOUNTER — Ambulatory Visit (HOSPITAL_COMMUNITY): Payer: Self-pay | Admitting: Psychiatry

## 2012-09-15 ENCOUNTER — Ambulatory Visit: Payer: Medicare Other | Admitting: Gastroenterology

## 2012-10-13 ENCOUNTER — Telehealth: Payer: Self-pay | Admitting: Gastroenterology

## 2012-10-13 NOTE — Telephone Encounter (Signed)
Message copied by Arna Snipe on Wed Oct 13, 2012  2:55 PM ------      Message from: Donata Duff      Created: Wed Sep 15, 2012 10:22 AM       Do not bill ------

## 2012-11-15 ENCOUNTER — Ambulatory Visit: Payer: Medicare Other | Admitting: Obstetrics

## 2013-01-21 ENCOUNTER — Emergency Department (HOSPITAL_COMMUNITY)
Admission: EM | Admit: 2013-01-21 | Discharge: 2013-01-21 | Disposition: A | Discharge status: Home / Self Care | Payer: Medicare Other

## 2013-01-24 ENCOUNTER — Encounter (HOSPITAL_COMMUNITY): Payer: Self-pay | Admitting: Emergency Medicine

## 2013-01-24 ENCOUNTER — Emergency Department (INDEPENDENT_AMBULATORY_CARE_PROVIDER_SITE_OTHER)
Admission: EM | Admit: 2013-01-24 | Discharge: 2013-01-24 | Disposition: A | Discharge status: Hospice | Payer: Medicare Other | Source: Home / Self Care | Attending: Emergency Medicine | Admitting: Emergency Medicine

## 2013-01-24 ENCOUNTER — Emergency Department (HOSPITAL_COMMUNITY)
Admission: EM | Admit: 2013-01-24 | Discharge: 2013-01-24 | Discharge status: Against Medical Advice | Payer: Medicare Other | Attending: Emergency Medicine | Admitting: Emergency Medicine

## 2013-01-24 DIAGNOSIS — D573 Sickle-cell trait: Secondary | ICD-10-CM | POA: Insufficient documentation

## 2013-01-24 DIAGNOSIS — R509 Fever, unspecified: Secondary | ICD-10-CM | POA: Insufficient documentation

## 2013-01-24 DIAGNOSIS — R011 Cardiac murmur, unspecified: Secondary | ICD-10-CM | POA: Insufficient documentation

## 2013-01-24 DIAGNOSIS — R059 Cough, unspecified: Secondary | ICD-10-CM | POA: Insufficient documentation

## 2013-01-24 DIAGNOSIS — J309 Allergic rhinitis, unspecified: Secondary | ICD-10-CM | POA: Insufficient documentation

## 2013-01-24 DIAGNOSIS — R1110 Vomiting, unspecified: Secondary | ICD-10-CM | POA: Insufficient documentation

## 2013-01-24 DIAGNOSIS — R05 Cough: Secondary | ICD-10-CM | POA: Insufficient documentation

## 2013-01-24 DIAGNOSIS — IMO0001 Reserved for inherently not codable concepts without codable children: Secondary | ICD-10-CM | POA: Insufficient documentation

## 2013-01-24 DIAGNOSIS — Z72820 Sleep deprivation: Secondary | ICD-10-CM | POA: Insufficient documentation

## 2013-01-24 DIAGNOSIS — F329 Major depressive disorder, single episode, unspecified: Secondary | ICD-10-CM | POA: Insufficient documentation

## 2013-01-24 DIAGNOSIS — F17200 Nicotine dependence, unspecified, uncomplicated: Secondary | ICD-10-CM | POA: Insufficient documentation

## 2013-01-24 DIAGNOSIS — E039 Hypothyroidism, unspecified: Secondary | ICD-10-CM | POA: Insufficient documentation

## 2013-01-24 DIAGNOSIS — R197 Diarrhea, unspecified: Secondary | ICD-10-CM | POA: Insufficient documentation

## 2013-01-24 DIAGNOSIS — IMO0002 Reserved for concepts with insufficient information to code with codable children: Secondary | ICD-10-CM | POA: Insufficient documentation

## 2013-01-24 DIAGNOSIS — R1032 Left lower quadrant pain: Secondary | ICD-10-CM

## 2013-01-24 DIAGNOSIS — F3289 Other specified depressive episodes: Secondary | ICD-10-CM | POA: Insufficient documentation

## 2013-01-24 DIAGNOSIS — M129 Arthropathy, unspecified: Secondary | ICD-10-CM | POA: Insufficient documentation

## 2013-01-24 DIAGNOSIS — R109 Unspecified abdominal pain: Secondary | ICD-10-CM | POA: Insufficient documentation

## 2013-01-24 MED ORDER — ONDANSETRON 4 MG PO TBDP
8.0000 mg | ORAL_TABLET | Freq: Once | ORAL | Status: AC
  Administered 2013-01-24: 8 mg via ORAL

## 2013-01-24 MED ORDER — ONDANSETRON 4 MG PO TBDP
ORAL_TABLET | ORAL | Status: AC
  Filled 2013-01-24: qty 2

## 2013-01-24 NOTE — ED Notes (Signed)
C/o flu like sx which started last thursday States she has diarrhea, chills, vomiting, nausea, and body ache Ibuprofen and tylenol was given but no relief.

## 2013-01-24 NOTE — ED Notes (Signed)
Pt came in with c/o lower abdominal pain since Thursday.  Pt has had diarrhea, vomiting, chills, runny nose and cough.  No fevers at home.  Pt last had ibuprofen 4 hrs PTA.  Pt is not eating or drinking well.  NAD.

## 2013-01-24 NOTE — ED Notes (Signed)
Pt does not want to wait on the adult side to be seen. Her daughter has been seen and discharged. Pt will follow up with her PCP

## 2013-01-24 NOTE — ED Provider Notes (Signed)
CSN: [id number]     Arrival date & time 01/24/13  1726 History   First MD Initiated Contact with Patient 01/24/13 1951     Chief Complaint  Patient presents with  . URI   (Consider location/radiation/quality/duration/timing/severity/associated sxs/prior Treatment) HPI Comments: 47 year old female presents complaining of nausea, vomiting, diarrhea, chills, body aches. This has been going on for 5 days, not yet getting any better. She says she is having severe abdominal pains around the left lower quadrant, described as the worst pain she has ever felt in her entire life. She is not able to hold down any solid foods in the past 5 days. No recent travel or sick contacts. She is here with her daughter who got sick last night, she believes she has gotten her daughter sick as well.  Patient is a 47 y.o. female presenting with URI.  URI Presenting symptoms: fatigue   Presenting symptoms: no cough and no fever   Associated symptoms: no arthralgias and no myalgias     Past Medical History  Diagnosis Date  . Arthritis   . Prolapsed disk   . Heart murmur   . Sleep deprivation   . Depression   . Sickle cell trait   . Hypothyroid    Past Surgical History  Procedure Laterality Date  . Cesarean section     Family History  Problem Relation Age of Onset  . Breast cancer Sister   . Diabetes Brother    History  Substance Use Topics  . Smoking status: Current Every Day Smoker -- 0.50 packs/day for 21 years  . Smokeless tobacco: Not on file  . Alcohol Use: No   OB History   Grav Para Term Preterm Abortions TAB SAB Ect Mult Living                 Review of Systems  Constitutional: Positive for chills and fatigue. Negative for fever.  Eyes: Negative for visual disturbance.  Respiratory: Negative for cough, chest tightness and shortness of breath.   Cardiovascular: Negative for chest pain, palpitations and leg swelling.  Gastrointestinal: Positive for nausea, vomiting, abdominal pain and  diarrhea. Negative for blood in stool.  Endocrine: Negative for polydipsia and polyuria.  Genitourinary: Negative for dysuria, urgency and frequency.  Musculoskeletal: Positive for back pain (chronic). Negative for arthralgias and myalgias.  Skin: Negative for rash.  Neurological: Negative for dizziness, weakness and light-headedness.    Allergies  Garlic  Home Medications   Current Outpatient Rx  Name  Route  Sig  Dispense  Refill  . beclomethasone (QVAR) 40 MCG/ACT inhaler   Inhalation   Inhale 2 puffs into the lungs 2 (two) times daily.   1 Inhaler   1   . benztropine (COGENTIN) 1 MG tablet   Oral   Take 1 tablet (1 mg total) by mouth daily.   30 tablet   1   . ibuprofen (ADVIL,MOTRIN) 800 MG tablet   Oral   Take 800 mg by mouth every 8 (eight) hours as needed for pain.         Marland Kitchen levothyroxine (SYNTHROID, LEVOTHROID) 75 MCG tablet   Oral   Take 1 tablet (75 mcg total) by mouth daily.   30 tablet   1   . metoCLOPramide (REGLAN) 10 MG tablet   Oral   Take 1 tablet (10 mg total) by mouth every 6 (six) hours as needed (nausea/headache).   6 tablet   0   . metroNIDAZOLE (FLAGYL) 500 MG tablet  Oral   Take 1 tablet (500 mg total) by mouth 2 (two) times daily.   14 tablet   0   . ondansetron (ZOFRAN-ODT) 8 MG disintegrating tablet   Oral   Take 8 mg by mouth every 4 (four) hours as needed for nausea.         Marland Kitchen oxyCODONE-acetaminophen (PERCOCET) 5-325 MG per tablet   Oral   Take 2 tablets by mouth every 4 (four) hours as needed for pain.   6 tablet   0   . PARoxetine (PAXIL) 20 MG tablet   Oral   Take 1 tablet (20 mg total) by mouth daily.   30 tablet   0   . risperidone (RISPERDAL) 4 MG tablet   Oral   Take 1 tablet (4 mg total) by mouth daily.   30 tablet   1    BP 129/83  Pulse 86  Temp(Src) 98.8 F (37.1 C) (Oral)  Resp 16  SpO2 99%  LMP 01/13/2013 Physical Exam  Nursing note and vitals reviewed. Constitutional: She is oriented to  person, place, and time. Vital signs are normal. She appears well-developed and well-nourished. No distress.  HENT:  Head: Normocephalic and atraumatic.  Eyes: Conjunctivae are normal. Right eye exhibits no discharge. Left eye exhibits no discharge.  Neck: Normal range of motion. Neck supple. No tracheal deviation present.  Cardiovascular: Normal rate, regular rhythm and normal heart sounds.  Exam reveals no gallop and no friction rub.   No murmur heard. Pulmonary/Chest: Effort normal and breath sounds normal. No respiratory distress. She has no wheezes. She has no rales.  Abdominal: Normal appearance. Bowel sounds are decreased. There is no hepatosplenomegaly. There is tenderness in the left lower quadrant. There is guarding (voluntary, left lower quadrant only). There is no rigidity, no rebound, no CVA tenderness, no tenderness at McBurney's point and negative Murphy's sign. No hernia.  Lymphadenopathy:    She has no cervical adenopathy.  Neurological: She is alert and oriented to person, place, and time. She has normal strength. Coordination normal.  Skin: Skin is warm and dry. No rash noted. She is not diaphoretic.  Psychiatric: She has a normal mood and affect. Judgment normal.    ED Course  Procedures (including critical care time) Labs Review Labs Reviewed - No data to display Imaging Review No results found.    MDM   1. Abdominal pain, left lower quadrant     Exam is concerning for diverticulitis. Patient was initially hesitant but agreed to go to the emergency department for further evaluation.    Graylon Good, PA-C 01/24/13 2052

## 2013-01-25 NOTE — ED Provider Notes (Signed)
Medical screening examination/treatment/procedure(s) were performed by non-physician practitioner and as supervising physician I was immediately available for consultation/collaboration.  Tyrihanna Wingert, M.D.  Kenyatte Chatmon C Morell Mears, MD 01/25/13 0017 

## 2013-08-30 ENCOUNTER — Ambulatory Visit: Payer: Self-pay | Admitting: Cardiovascular Disease

## 2013-09-05 ENCOUNTER — Other Ambulatory Visit: Payer: Self-pay

## 2013-09-05 DIAGNOSIS — Z1231 Encounter for screening mammogram for malignant neoplasm of breast: Secondary | ICD-10-CM

## 2013-09-13 ENCOUNTER — Ambulatory Visit
Admission: RE | Admit: 2013-09-13 | Discharge: 2013-09-13 | Disposition: A | Discharge status: Home / Self Care | Payer: Medicare Other | Source: Ambulatory Visit

## 2013-09-13 DIAGNOSIS — Z1231 Encounter for screening mammogram for malignant neoplasm of breast: Secondary | ICD-10-CM

## 2013-09-15 ENCOUNTER — Other Ambulatory Visit: Payer: Self-pay | Admitting: Internal Medicine

## 2013-09-15 DIAGNOSIS — R928 Other abnormal and inconclusive findings on diagnostic imaging of breast: Secondary | ICD-10-CM

## 2013-09-28 ENCOUNTER — Emergency Department (HOSPITAL_COMMUNITY): Payer: Medicare Other

## 2013-09-28 ENCOUNTER — Encounter (HOSPITAL_COMMUNITY): Payer: Self-pay | Admitting: Emergency Medicine

## 2013-09-28 ENCOUNTER — Emergency Department (HOSPITAL_COMMUNITY)
Admission: EM | Admit: 2013-09-28 | Discharge: 2013-09-29 | Disposition: A | Discharge status: Home / Self Care | Payer: Medicare Other | Attending: Emergency Medicine | Admitting: Emergency Medicine

## 2013-09-28 DIAGNOSIS — E039 Hypothyroidism, unspecified: Secondary | ICD-10-CM | POA: Diagnosis not present

## 2013-09-28 DIAGNOSIS — R1084 Generalized abdominal pain: Secondary | ICD-10-CM | POA: Diagnosis present

## 2013-09-28 DIAGNOSIS — F411 Generalized anxiety disorder: Secondary | ICD-10-CM | POA: Diagnosis not present

## 2013-09-28 DIAGNOSIS — R011 Cardiac murmur, unspecified: Secondary | ICD-10-CM | POA: Diagnosis not present

## 2013-09-28 DIAGNOSIS — F32A Depression, unspecified: Secondary | ICD-10-CM

## 2013-09-28 DIAGNOSIS — Z79899 Other long term (current) drug therapy: Secondary | ICD-10-CM | POA: Diagnosis not present

## 2013-09-28 DIAGNOSIS — R1110 Vomiting, unspecified: Secondary | ICD-10-CM | POA: Insufficient documentation

## 2013-09-28 DIAGNOSIS — Z862 Personal history of diseases of the blood and blood-forming organs and certain disorders involving the immune mechanism: Secondary | ICD-10-CM | POA: Insufficient documentation

## 2013-09-28 DIAGNOSIS — F329 Major depressive disorder, single episode, unspecified: Secondary | ICD-10-CM | POA: Diagnosis not present

## 2013-09-28 DIAGNOSIS — Z3202 Encounter for pregnancy test, result negative: Secondary | ICD-10-CM | POA: Insufficient documentation

## 2013-09-28 DIAGNOSIS — F3289 Other specified depressive episodes: Secondary | ICD-10-CM | POA: Insufficient documentation

## 2013-09-28 DIAGNOSIS — F17200 Nicotine dependence, unspecified, uncomplicated: Secondary | ICD-10-CM | POA: Insufficient documentation

## 2013-09-28 DIAGNOSIS — Z8739 Personal history of other diseases of the musculoskeletal system and connective tissue: Secondary | ICD-10-CM | POA: Insufficient documentation

## 2013-09-28 HISTORY — DX: Anxiety disorder, unspecified: F41.9

## 2013-09-28 LAB — PREGNANCY, URINE: PREG TEST UR: NEGATIVE

## 2013-09-28 LAB — COMPREHENSIVE METABOLIC PANEL
ALK PHOS: 79 U/L (ref 39–117)
ALT: 13 U/L (ref 0–35)
AST: 13 U/L (ref 0–37)
Albumin: 4.2 g/dL (ref 3.5–5.2)
Anion gap: 16 — ABNORMAL HIGH (ref 5–15)
BUN: 10 mg/dL (ref 6–23)
CALCIUM: 10 mg/dL (ref 8.4–10.5)
CO2: 21 meq/L (ref 19–32)
Chloride: 107 mEq/L (ref 96–112)
Creatinine, Ser: 0.78 mg/dL (ref 0.50–1.10)
GLUCOSE: 116 mg/dL — AB (ref 70–99)
POTASSIUM: 3.7 meq/L (ref 3.7–5.3)
SODIUM: 144 meq/L (ref 137–147)
Total Bilirubin: 0.6 mg/dL (ref 0.3–1.2)
Total Protein: 8.2 g/dL (ref 6.0–8.3)

## 2013-09-28 LAB — URINALYSIS, ROUTINE W REFLEX MICROSCOPIC
Bilirubin Urine: NEGATIVE
GLUCOSE, UA: NEGATIVE mg/dL
KETONES UR: 40 mg/dL — AB
LEUKOCYTES UA: NEGATIVE
Nitrite: NEGATIVE
PH: 8.5 — AB (ref 5.0–8.0)
PROTEIN: NEGATIVE mg/dL
Specific Gravity, Urine: 1.015 (ref 1.005–1.030)
Urobilinogen, UA: 0.2 mg/dL (ref 0.0–1.0)

## 2013-09-28 LAB — CBC WITH DIFFERENTIAL/PLATELET
BASOS PCT: 0 % (ref 0–1)
Basophils Absolute: 0 10*3/uL (ref 0.0–0.1)
EOS PCT: 1 % (ref 0–5)
Eosinophils Absolute: 0.1 10*3/uL (ref 0.0–0.7)
HCT: 39.4 % (ref 36.0–46.0)
HEMOGLOBIN: 13 g/dL (ref 12.0–15.0)
LYMPHS PCT: 20 % (ref 12–46)
Lymphs Abs: 1.9 10*3/uL (ref 0.7–4.0)
MCH: 21.9 pg — ABNORMAL LOW (ref 26.0–34.0)
MCHC: 33 g/dL (ref 30.0–36.0)
MCV: 66.4 fL — ABNORMAL LOW (ref 78.0–100.0)
MONOS PCT: 7 % (ref 3–12)
Monocytes Absolute: 0.7 10*3/uL (ref 0.1–1.0)
NEUTROS PCT: 72 % (ref 43–77)
Neutro Abs: 6.6 10*3/uL (ref 1.7–7.7)
PLATELETS: 316 10*3/uL (ref 150–400)
RBC: 5.93 MIL/uL — AB (ref 3.87–5.11)
RDW: 14.1 % (ref 11.5–15.5)
WBC: 9.3 10*3/uL (ref 4.0–10.5)

## 2013-09-28 LAB — RAPID URINE DRUG SCREEN, HOSP PERFORMED
AMPHETAMINES: NOT DETECTED
BENZODIAZEPINES: NOT DETECTED
Barbiturates: NOT DETECTED
COCAINE: NOT DETECTED
Opiates: POSITIVE — AB
Tetrahydrocannabinol: NOT DETECTED

## 2013-09-28 LAB — ETHANOL

## 2013-09-28 LAB — LIPASE, BLOOD: Lipase: 19 U/L (ref 11–59)

## 2013-09-28 LAB — SALICYLATE LEVEL: Salicylate Lvl: <2 mg/dL — ABNORMAL LOW (ref 2.8–20.0)

## 2013-09-28 LAB — URINE MICROSCOPIC-ADD ON

## 2013-09-28 LAB — ACETAMINOPHEN LEVEL: Acetaminophen (Tylenol), Serum: <15 ug/mL (ref 10–30)

## 2013-09-28 MED ORDER — ONDANSETRON HCL 4 MG/2ML IJ SOLN
4.0000 mg | Freq: Once | INTRAMUSCULAR | Status: AC
  Administered 2013-09-28: 4 mg via INTRAVENOUS
  Filled 2013-09-28: qty 2

## 2013-09-28 MED ORDER — LORAZEPAM 2 MG/ML IJ SOLN
1.0000 mg | Freq: Once | INTRAMUSCULAR | Status: AC
  Administered 2013-09-28: 1 mg via INTRAVENOUS
  Filled 2013-09-28: qty 1

## 2013-09-28 MED ORDER — FENTANYL CITRATE 0.05 MG/ML IJ SOLN: 50.0000 ug | Freq: Once | INTRAMUSCULAR | Status: DC

## 2013-09-28 MED ORDER — SODIUM CHLORIDE 0.9 % IV BOLUS (SEPSIS)
1000.0000 mL | Freq: Once | INTRAVENOUS | Status: AC
  Administered 2013-09-28: 1000 mL via INTRAVENOUS

## 2013-09-28 NOTE — ED Provider Notes (Signed)
CSN: [id number]     Arrival date & time 09/28/13  1448 History   First MD Initiated Contact with Patient 09/28/13 1500     Chief Complaint  Patient presents with  . Abdominal Pain  . Emesis     (Consider location/radiation/quality/duration/timing/severity/associated sxs/prior Treatment) HPI Comments: Pt presents with abd pain and associated n/v. She states it hurts all over and she feels that she's going to die. She states her symptoms started yesterday and got worse through today. I was in the room, and she started talking about her opioid addiction. She states that she's been abusing narcotic pain medicines, primarily Percocet. She states that she stops cold [place] about 2 days ago. And she feels that her symptoms are related to that. She denies any hematemesis. She denies any diarrhea or constipation. She denies any blood in her stool. She denies any urinary symptoms. She had a similar admission about a year ago for ongoing abdominal pain with associated vomiting. Her workup was unremarkable for an etiology.  Patient is a 48 y.o. female presenting with abdominal pain and vomiting.  Abdominal Pain Associated symptoms: nausea and vomiting   Associated symptoms: no chest pain, no chills, no cough, no diarrhea, no fatigue, no fever, no hematuria and no shortness of breath   Emesis Associated symptoms: abdominal pain   Associated symptoms: no arthralgias, no chills, no diarrhea and no headaches     Past Medical History  Diagnosis Date  . Arthritis   . Prolapsed disk   . Heart murmur   . Sleep deprivation   . Depression   . Sickle cell trait   . Hypothyroid   . Anxiety    Past Surgical History  Procedure Laterality Date  . Cesarean section     Family History  Problem Relation Age of Onset  . Breast cancer Sister   . Diabetes Brother    History  Substance Use Topics  . Smoking status: Current Every Day Smoker -- 0.50 packs/day for 21 years  . Smokeless tobacco: Not on file  .  Alcohol Use: No   OB History   Grav Para Term Preterm Abortions TAB SAB Ect Mult Living                 Review of Systems  Constitutional: Negative for fever, chills, diaphoresis and fatigue.  HENT: Negative for congestion, rhinorrhea and sneezing.   Eyes: Negative.   Respiratory: Negative for cough, chest tightness and shortness of breath.   Cardiovascular: Negative for chest pain and leg swelling.  Gastrointestinal: Positive for nausea, vomiting and abdominal pain. Negative for diarrhea and blood in stool.  Genitourinary: Negative for frequency, hematuria, flank pain and difficulty urinating.  Musculoskeletal: Negative for arthralgias and back pain.  Skin: Negative for rash.  Neurological: Negative for dizziness, speech difficulty, weakness, numbness and headaches.  Psychiatric/Behavioral: Positive for agitation. The patient is nervous/anxious.       Allergies  Garlic  Home Medications   Prior to Admission medications   Medication Sig Start Date End Date Taking? Authorizing Provider  carbamazepine (CARBATROL) 200 MG 12 hr capsule Take 200 mg by mouth 2 (two) times daily.   Yes Historical Provider, MD  clonazePAM (KLONOPIN) 2 MG tablet Take 2 mg by mouth 2 (two) times daily.   Yes Historical Provider, MD  cloNIDine (CATAPRES) 0.1 MG tablet Take 0.1 mg by mouth every evening.   Yes Historical Provider, MD  dicyclomine (BENTYL) 10 MG capsule Take 10 mg by mouth 3 (three) times  daily as needed for spasms.   Yes Historical Provider, MD  FLUoxetine (PROZAC) 20 MG capsule Take 40 mg by mouth daily.   Yes Historical Provider, MD  gabapentin (NEURONTIN) 300 MG capsule Take 300 mg by mouth 3 (three) times daily.   Yes Historical Provider, MD  levothyroxine (SYNTHROID, LEVOTHROID) 75 MCG tablet Take 1 tablet (75 mcg total) by mouth daily. 12/19/11  Yes Janece Canterbury, MD  mirtazapine (REMERON) 30 MG tablet Take 30 mg by mouth at bedtime.   Yes Historical Provider, MD  ondansetron  (ZOFRAN-ODT) 8 MG disintegrating tablet Take 8 mg by mouth every 4 (four) hours as needed for nausea.   Yes Historical Provider, MD  promethazine (PHENERGAN) 25 MG tablet Take 25 mg by mouth every 6 (six) hours as needed for nausea or vomiting.   Yes Historical Provider, MD  QUEtiapine (SEROQUEL) 200 MG tablet Take 200 mg by mouth at bedtime.   Yes Historical Provider, MD  risperidone (RISPERDAL) 4 MG tablet Take 1 tablet (4 mg total) by mouth daily. 12/19/11  Yes Janece Canterbury, MD   BP 141/74  Pulse 64  Temp(Src) 98.1 F (36.7 C) (Oral)  Resp 20  SpO2 100%  LMP 09/10/2013 Physical Exam  Constitutional: She is oriented to person, place, and time. She appears well-developed and well-nourished.  HENT:  Head: Normocephalic and atraumatic.  Eyes: Pupils are equal, round, and reactive to light.  Neck: Normal range of motion. Neck supple.  Cardiovascular: Normal rate, regular rhythm and normal heart sounds.   Pulmonary/Chest: Effort normal and breath sounds normal. No respiratory distress. She has no wheezes. She has no rales. She exhibits no tenderness.  Abdominal: Soft. Bowel sounds are normal. There is tenderness (generalized moderate tenderness). There is no rebound and no guarding.  Musculoskeletal: Normal range of motion. She exhibits no edema.  Lymphadenopathy:    She has no cervical adenopathy.  Neurological: She is alert and oriented to person, place, and time.  Skin: Skin is warm and dry. No rash noted.  Psychiatric: She has a normal mood and affect.    ED Course  Procedures (including critical care time) Labs Review Results for orders placed during the hospital encounter of 09/28/13  CBC WITH DIFFERENTIAL      Result Value Ref Range   WBC 9.3  4.0 - 10.5 K/uL   RBC 5.93 (*) 3.87 - 5.11 MIL/uL   Hemoglobin 13.0  12.0 - 15.0 g/dL   HCT 39.4  36.0 - 46.0 %   MCV 66.4 (*) 78.0 - 100.0 fL   MCH 21.9 (*) 26.0 - 34.0 pg   MCHC 33.0  30.0 - 36.0 g/dL   RDW 14.1  11.5 - 15.5 %    Platelets 316  150 - 400 K/uL   Neutrophils Relative % 72  43 - 77 %   Lymphocytes Relative 20  12 - 46 %   Monocytes Relative 7  3 - 12 %   Eosinophils Relative 1  0 - 5 %   Basophils Relative 0  0 - 1 %   Neutro Abs 6.6  1.7 - 7.7 K/uL   Lymphs Abs 1.9  0.7 - 4.0 K/uL   Monocytes Absolute 0.7  0.1 - 1.0 K/uL   Eosinophils Absolute 0.1  0.0 - 0.7 K/uL   Basophils Absolute 0.0  0.0 - 0.1 K/uL   Smear Review MORPHOLOGY UNREMARKABLE    COMPREHENSIVE METABOLIC PANEL      Result Value Ref Range   Sodium 144  137 - 147 mEq/L   Potassium 3.7  3.7 - 5.3 mEq/L   Chloride 107  96 - 112 mEq/L   CO2 21  19 - 32 mEq/L   Glucose, Bld 116 (*) 70 - 99 mg/dL   BUN 10  6 - 23 mg/dL   Creatinine, Ser 0.78  0.50 - 1.10 mg/dL   Calcium 10.0  8.4 - 10.5 mg/dL   Total Protein 8.2  6.0 - 8.3 g/dL   Albumin 4.2  3.5 - 5.2 g/dL   AST 13  0 - 37 U/L   ALT 13  0 - 35 U/L   Alkaline Phosphatase 79  39 - 117 U/L   Total Bilirubin 0.6  0.3 - 1.2 mg/dL   GFR calc non Af Amer >90  >90 mL/min   GFR calc Af Amer >90  >90 mL/min   Anion gap 16 (*) 5 - 15  LIPASE, BLOOD      Result Value Ref Range   Lipase 19  11 - 59 U/L  URINALYSIS, ROUTINE W REFLEX MICROSCOPIC      Result Value Ref Range   Color, Urine YELLOW  YELLOW   APPearance CLEAR  CLEAR   Specific Gravity, Urine 1.015  1.005 - 1.030   pH 8.5 (*) 5.0 - 8.0   Glucose, UA NEGATIVE  NEGATIVE mg/dL   Hgb urine dipstick LARGE (*) NEGATIVE   Bilirubin Urine NEGATIVE  NEGATIVE   Ketones, ur 40 (*) NEGATIVE mg/dL   Protein, ur NEGATIVE  NEGATIVE mg/dL   Urobilinogen, UA 0.2  0.0 - 1.0 mg/dL   Nitrite NEGATIVE  NEGATIVE   Leukocytes, UA NEGATIVE  NEGATIVE  PREGNANCY, URINE      Result Value Ref Range   Preg Test, Ur NEGATIVE  NEGATIVE  URINE RAPID DRUG SCREEN (HOSP PERFORMED)      Result Value Ref Range   Opiates POSITIVE (*) NONE DETECTED   Cocaine NONE DETECTED  NONE DETECTED   Benzodiazepines NONE DETECTED  NONE DETECTED   Amphetamines NONE  DETECTED  NONE DETECTED   Tetrahydrocannabinol NONE DETECTED  NONE DETECTED   Barbiturates NONE DETECTED  NONE DETECTED  ETHANOL      Result Value Ref Range   Alcohol, Ethyl (B) <11  0 - 11 mg/dL  ACETAMINOPHEN LEVEL      Result Value Ref Range   Acetaminophen (Tylenol), Serum <15.0  10 - 30 ug/mL  SALICYLATE LEVEL      Result Value Ref Range   Salicylate Lvl <8.1 (*) 2.8 - 20.0 mg/dL  URINE MICROSCOPIC-ADD ON      Result Value Ref Range   Squamous Epithelial / LPF RARE  RARE   WBC, UA 0-2  <3 WBC/hpf   RBC / HPF 11-20  <3 RBC/hpf   Dg Abd Acute W/chest  09/28/2013   CLINICAL DATA:  Abdominal pain  EXAM: ACUTE ABDOMEN SERIES (ABDOMEN 2 VIEW & CHEST 1 VIEW)  COMPARISON:  None.  FINDINGS: There is no evidence of dilated bowel loops or free intraperitoneal air. Stone within the inferior pole the right kidney measures 3 mm. Heart size and mediastinal contours are within normal limits. Both lungs are clear.  IMPRESSION: 1. Right renal calculus measures 3 mm. Nonobstructing bowel gas pattern.   Electronically Signed   By: Kerby Moors M.D.   On: 09/28/2013 16:43   Mm Digital Screening Bilateral  09/13/2013   CLINICAL DATA:  Screening.  EXAM: DIGITAL SCREENING BILATERAL MAMMOGRAM WITH CAD  COMPARISON:  Previous exam(s)  ACR Breast Density Category c: The breast tissue is heterogeneously dense, which may obscure small masses.  FINDINGS: In the left breast, a possible mass warrants further evaluation with spot compression views and possibly ultrasound. In the right breast, no findings suspicious for malignancy.  Images were processed with CAD.  IMPRESSION: Further evaluation is suggested for possible mass in the left breast.  RECOMMENDATION: Diagnostic mammogram and possibly ultrasound of the left breast. (Code:[id number])  The patient will be contacted regarding the findings, and additional imaging will be scheduled.  BI-RADS CATEGORY  0: Incomplete. Need additional imaging evaluation and/or prior  mammograms for comparison.   Electronically Signed   By: Luberta Robertson M.D.   On: 09/13/2013 16:20     Imaging Review Ct Abdomen Pelvis Wo Contrast  09/28/2013   CLINICAL DATA:  Left flank pain with hematuria.  EXAM: CT ABDOMEN AND PELVIS WITHOUT CONTRAST  TECHNIQUE: Multidetector CT imaging of the abdomen and pelvis was performed following the standard protocol without IV contrast.  COMPARISON:  08/07/2012  FINDINGS: Lung Bases: No focal consolidation or pleural effusion.  Liver:  No focal abnormality. No evidence for hepatomegaly.  Spleen: No splenomegaly. No focal mass lesion.  Stomach: Nondistended. No gastric wall thickening. No evidence of outlet obstruction.  Pancreas: No focal mass lesion. No dilatation of the main duct. No intraparenchymal cyst. No peripancreatic edema.  Gallbladder/Biliary: No evidence for gallstones. No pericholecystic fluid. No intrahepatic or extrahepatic biliary dilation.  Kidneys/Adrenals: No adrenal nodule. 3 mm nonobstructing stone is seen in the lower pole the right kidney. No left renal stone. There is no ureteral or bladder stone. No secondary changes in either kidney or ureter.  Bowel Loops: Duodenum is normally positioned as is the ligament of Treitz. No small bowel dilatation. Terminal ileum is normal. Appendix is normal. No colonic diverticulitis.  Nodes: No gastrohepatic or hepatoduodenal ligament lymphadenopathy. No retroperitoneal lymphadenopathy in the abdomen. No evidence for pelvic sidewall lymphadenopathy.  Vasculature: No abdominal aortic aneurysm.  Pelvic Genitourinary: Bladder is moderately distended. The uterus is normal by CT imaging. No adnexal mass.  Bones/Musculoskeletal: Bone windows reveal no worrisome lytic or sclerotic osseous lesions.  Body Wall: No evidence for abdominal wall hernia.  Other: No intraperitoneal free fluid.  IMPRESSION: 3 mm nonobstructing stone in the lower pole of the right kidney. No other urinary stones are evident. No secondary  changes in either kidney or ureter.   Electronically Signed   By: Misty Stanley M.D.   On: 09/28/2013 19:10   Dg Abd Acute W/chest  09/28/2013   CLINICAL DATA:  Abdominal pain  EXAM: ACUTE ABDOMEN SERIES (ABDOMEN 2 VIEW & CHEST 1 VIEW)  COMPARISON:  None.  FINDINGS: There is no evidence of dilated bowel loops or free intraperitoneal air. Stone within the inferior pole the right kidney measures 3 mm. Heart size and mediastinal contours are within normal limits. Both lungs are clear.  IMPRESSION: 1. Right renal calculus measures 3 mm. Nonobstructing bowel gas pattern.   Electronically Signed   By: Kerby Moors M.D.   On: 09/28/2013 16:43     EKG Interpretation None      MDM   Final diagnoses:  Generalized abdominal pain  Depression    Patient presents with vomiting and generalized abdominal pain. Her CT is unremarkable for etiology for her pain. Her lab work is unremarkable other than some hematuria. She does comment that she feels like she wants to die. She states she's had a long-standing history of depression and has  frequent suicidal ideations. I will consult TTS for evaluation.    Malvin Johns, MD 09/28/13 2112

## 2013-09-28 NOTE — ED Notes (Signed)
Per EMS, pt from home.  Pt c/o lower abdominal pain with n/v.  Pt symptoms started this morning when she woke up.  Actively vomiting on scene with EMS.  18g LAC.  Zofran 4mg  given in route.  Vitals:  150/90, hr 60, resp 16

## 2013-09-28 NOTE — ED Notes (Signed)
Call pt uncle at [phone] for discharge or admission

## 2013-09-28 NOTE — ED Notes (Signed)
Bed: WA20 Expected date:  Expected time:  Means of arrival:  Comments: ems 

## 2013-09-29 ENCOUNTER — Encounter (HOSPITAL_COMMUNITY): Payer: Self-pay | Admitting: *Deleted

## 2013-09-29 DIAGNOSIS — R1084 Generalized abdominal pain: Secondary | ICD-10-CM | POA: Diagnosis not present

## 2013-09-29 MED ORDER — LORAZEPAM 1 MG PO TABS
1.0000 mg | ORAL_TABLET | Freq: Three times a day (TID) | ORAL | Status: DC | PRN
  Administered 2013-09-29: 1 mg via ORAL
  Filled 2013-09-29: qty 1

## 2013-09-29 MED ORDER — ONDANSETRON 8 MG PO TBDP: 8.0000 mg | ORAL_TABLET | ORAL | Status: DC | PRN

## 2013-09-29 MED ORDER — ONDANSETRON HCL 4 MG/2ML IJ SOLN
4.0000 mg | Freq: Once | INTRAMUSCULAR | Status: AC
  Administered 2013-09-29: 4 mg via INTRAVENOUS

## 2013-09-29 MED ORDER — PROMETHAZINE HCL 25 MG PO TABS: 25.0000 mg | ORAL_TABLET | Freq: Four times a day (QID) | ORAL | Status: DC | PRN

## 2013-09-29 MED ORDER — PROMETHAZINE HCL 25 MG RE SUPP: 25.0000 mg | Freq: Four times a day (QID) | RECTAL | Status: DC | PRN

## 2013-09-29 MED ORDER — ONDANSETRON HCL 4 MG/2ML IJ SOLN
4.0000 mg | Freq: Four times a day (QID) | INTRAMUSCULAR | Status: DC | PRN
  Administered 2013-09-29: 4 mg via INTRAVENOUS
  Filled 2013-09-29 (×2): qty 2

## 2013-09-29 MED ORDER — DICYCLOMINE HCL 20 MG PO TABS: 20.0000 mg | ORAL_TABLET | Freq: Four times a day (QID) | ORAL | Status: DC | PRN

## 2013-09-29 NOTE — BH Assessment (Signed)
Tele Assessment Note   Victoria Flores is an 48 y.o. female who initially presented to [facility] c/o abdominal pain.  Pt expressed that she was hurting all over and that she wanted to die.  Pt also stated she was abusing her pain medications and would be interested in outpatient services for to help her with opioid abuse. Pt stated she had already been given medication to help her with the pain medication abuse but it is not working.  Pt denies current SI thoughts, stating she has thoughts of self harm sometimes, but has never tried to harm herself.  Pt says she made comment while in the emerg dept because no one was helping her.  Pt is irritable and told this writer that she didn't want to go home because she doesn't physically feel well and she "just going to end up coming back".  This Probation officer discussed disposition with Dr. Sharol Given and pt will be d/c'd with referrals for SA.     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 Arthritis   . Prolapsed disk   . Heart murmur   . Sleep deprivation   . Depression   . Sickle cell trait   . Hypothyroid   . Anxiety    Axis IV: other psychosocial or environmental problems, problems related to social environment and problems with primary support group Axis V: 41-50 serious symptoms  Past Medical History:  Past Medical History  Diagnosis Date  . Arthritis   . Prolapsed disk   . Heart murmur   . Sleep deprivation   . Depression   . Sickle cell trait   . Hypothyroid   . Anxiety     Past Surgical History  Procedure Laterality Date  . Cesarean section      Family History:  Family History  Problem Relation Age of Onset  . Breast cancer Sister   . Diabetes Brother     Social History:  reports that she has been smoking.  She does not have any smokeless tobacco history on file. She reports that she does not drink alcohol or use illicit drugs.  Additional Social  History:  Alcohol / Drug Use Pain Medications: See mAR  Prescriptions: See MAR  Over the Counter: See MAR  History of alcohol / drug use?: No history of alcohol / drug abuse Longest period of sobriety (when/how long): None   CIWA: CIWA-Ar BP: 131/53 mmHg Pulse Rate: 77 COWS:    PATIENT STRENGTHS: (choose at least two) NA  Allergies:  Allergies  Allergen Reactions  . Garlic Diarrhea    Vomiting    Home Medications:  (Not in a hospital admission)  OB/GYN Status:  Patient's last menstrual period was 09/10/2013.  General Assessment Data Location of Assessment: [facility] ED Is this a Tele or Face-to-Face Assessment?: Tele Assessment Is this an Initial Assessment or a Re-assessment for this encounter?: Initial Assessment Living Arrangements: Alone Can pt return to current living arrangement?: Yes Admission Status: Voluntary Is patient capable of signing voluntary admission?: Yes Transfer from: [facility] Referral Source: MD  Medical Screening Exam ([place]) Medical Exam completed: No Reason for MSE not completed: Other: (None )  [place] Living Arrangements: Alone Name of Psychiatrist: None  Name of Therapist: None   Education Status Is patient currently in school?: No Current Grade: None  Highest grade of school patient has completed: None  Name of school:  None  Contact person: None   Risk to self with the past 6 months Suicidal Ideation: No Suicidal Intent: No Is patient at risk for suicide?: No Suicidal Plan?: No Access to Means: No What has been your use of drugs/alcohol within the last 12 months?: Abusing: pain meds  Previous Attempts/Gestures: No How many times?: 0 Other Self Harm Risks: None  Triggers for Past Attempts: None known Intentional Self Injurious Behavior: None Family Suicide History: No Recent stressful life event(s): Recent negative physical changes Persecutory voices/beliefs?: No Depression: Yes Depression  Symptoms: Feeling angry/irritable Substance abuse history and/or treatment for substance abuse?: No Suicide prevention information given to non-admitted patients: Not applicable  Risk to Others within the past 6 months Homicidal Ideation: No Thoughts of Harm to Others: No Current Homicidal Intent: No Current Homicidal Plan: No Access to Homicidal Means: No Identified Victim: None  History of harm to others?: No Assessment of Violence: None Noted Violent Behavior Description: None  Does patient have access to weapons?: No Criminal Charges Pending?: No Does patient have a court date: No  Psychosis Hallucinations: None noted Delusions: None noted  Mental Status Report Appear/Hygiene: In hospital gown Eye Contact: Poor Motor Activity: Unremarkable Speech: Logical/coherent Level of Consciousness: Alert Mood: Irritable Affect: Irritable Anxiety Level: None Thought Processes: Coherent;Relevant Judgement: Unimpaired Orientation: Person;Place;Time;Situation Obsessive Compulsive Thoughts/Behaviors: None  Cognitive Functioning Concentration: Normal Memory: Recent Intact;Remote Intact IQ: Average Insight: Fair Impulse Control: Fair Appetite: Fair Weight Loss: 0 Weight Gain: 0 Sleep: No Change Total Hours of Sleep: 6 Vegetative Symptoms: None  ADLScreening [facility] Assessment Services) Patient's cognitive ability adequate to safely complete daily activities?: Yes Patient able to express need for assistance with ADLs?: Yes Independently performs ADLs?: Yes (appropriate for developmental age)  Prior Inpatient Therapy Prior Inpatient Therapy: No Prior Therapy Dates: None  Prior Therapy Facilty/Provider(s): None  Reason for Treatment: None   Prior Outpatient Therapy Prior Outpatient Therapy: No Prior Therapy Dates: None  Prior Therapy Facilty/Provider(s): None  Reason for Treatment: None   ADL Screening (condition at time of admission) Patient's cognitive ability  adequate to safely complete daily activities?: Yes Is the patient deaf or have difficulty hearing?: No Does the patient have difficulty seeing, even when wearing glasses/contacts?: No Does the patient have difficulty concentrating, remembering, or making decisions?: No Patient able to express need for assistance with ADLs?: Yes Does the patient have difficulty dressing or bathing?: No Independently performs ADLs?: Yes (appropriate for developmental age) Does the patient have difficulty walking or climbing stairs?: No Weakness of Legs: None Weakness of Arms/Hands: None  Home Assistive Devices/Equipment Home Assistive Devices/Equipment: None  Therapy Consults (therapy consults require a physician order) PT Evaluation Needed: No OT Evalulation Needed: No SLP Evaluation Needed: No Abuse/Neglect Assessment (Assessment to be complete while patient is alone) Physical Abuse: Denies Verbal Abuse: Denies Sexual Abuse: Denies Exploitation of patient/patient's resources: Denies Self-Neglect: Denies Values / Beliefs Cultural Requests During Hospitalization: None Spiritual Requests During Hospitalization: None Consults Spiritual Care Consult Needed: No Social Work Consult Needed: No Regulatory affairs officer (For Healthcare) Does patient have an advance directive?: No Would patient like information on creating an advanced directive?: No - patient declined information Nutrition Screen- [facility] Adult/[facility] Patient's home diet: Regular  Additional Information 1:1 In Past 12 Months?: No CIRT Risk: No Elopement Risk: No Does patient have medical clearance?: Yes     Disposition:  Disposition Initial Assessment Completed for this Encounter: Yes Disposition of Patient: Outpatient treatment (D/C with referrals ) Type of outpatient treatment: Adult (  D/C with referrals )  G[place]a Rm 09/29/2013 7:19 AM

## 2013-09-29 NOTE — ED Notes (Signed)
Pt's uncle was called and notified of pt's discharge--- uncle stated that he will be here in 20 minutes to pick up pt.

## 2013-09-29 NOTE — ED Provider Notes (Signed)
Pt has had unremarkable medical evaluation.  Pt seen by TTS, now denies SI with plan.  She is not wanting psych admission, but does want medical admission.  I discussed with the patient her normal workup does not meet criteria for admission.  Will d/c home with rx for nausea, abd cramping.  Kalman Drape, MD 09/29/13 [phone]

## 2013-09-29 NOTE — ED Notes (Signed)
TTS consult in process at this time. 

## 2013-09-29 NOTE — Discharge Instructions (Signed)
Abdominal Pain Many things can cause abdominal pain. Usually, abdominal pain is not caused by a disease and will improve without treatment. It can often be observed and treated at home. Your health care provider will do a physical exam and possibly order blood tests and X-rays to help determine the seriousness of your pain. However, in many cases, more time must pass before a clear cause of the pain can be found. Before that point, your health care provider may not know if you need more testing or further treatment. HOME CARE INSTRUCTIONS  Monitor your abdominal pain for any changes. The following actions may help to alleviate any discomfort you are experiencing:  Only take over-the-counter or prescription medicines as directed by your health care provider.  Do not take laxatives unless directed to do so by your health care provider.  Try a clear liquid diet (broth, tea, or water) as directed by your health care provider. Slowly move to a bland diet as tolerated. SEEK MEDICAL CARE IF:  You have unexplained abdominal pain.  You have abdominal pain associated with nausea or diarrhea.  You have pain when you urinate or have a bowel movement.  You experience abdominal pain that wakes you in the night.  You have abdominal pain that is worsened or improved by eating food.  You have abdominal pain that is worsened with eating fatty foods.  You have a fever. SEEK IMMEDIATE MEDICAL CARE IF:   Your pain does not go away within 2 hours.  You keep throwing up (vomiting).  Your pain is felt only in portions of the abdomen, such as the right side or the left lower portion of the abdomen.  You pass bloody or black tarry stools. MAKE SURE YOU:  Understand these instructions.   Will watch your condition.   Will get help right away if you are not doing well or get worse.  Document Released: 10/23/2004 Document Revised: 01/18/2013 Document Reviewed: 09/22/2012 [facility] Patient Information  2015 [place], [place]. This information is not intended to replace advice given to you by your health care provider. Make sure you discuss any questions you have with your health care provider.  Depression Depression refers to feeling sad, low, down in the dumps, blue, gloomy, or empty. In general, there are two kinds of depression: 1. Normal sadness or normal grief. This kind of depression is one that we all feel from time to time after upsetting life experiences, such as the loss of a job or the ending of a relationship. This kind of depression is considered normal, is short lived, and resolves within a few days to 2 weeks. Depression experienced after the loss of a loved one (bereavement) often lasts longer than 2 weeks but normally gets better with time. 2. Clinical depression. This kind of depression lasts longer than normal sadness or normal grief or interferes with your ability to function at home, at work, and in school. It also interferes with your personal relationships. It affects almost every aspect of your life. Clinical depression is an illness. Symptoms of depression can also be caused by conditions other than those mentioned above, such as:  Physical illness. Some physical illnesses, including underactive thyroid gland (hypothyroidism), severe anemia, specific types of cancer, diabetes, uncontrolled seizures, heart and lung problems, strokes, and chronic pain are commonly associated with symptoms of depression.  Side effects of some prescription medicine. In some people, certain types of medicine can cause symptoms of depression.  Substance abuse. Abuse of alcohol and illicit drugs  can cause symptoms of depression. SYMPTOMS Symptoms of normal sadness and normal grief include the following:  Feeling sad or crying for short periods of time.  Not caring about anything (apathy).  Difficulty sleeping or sleeping too much.  No longer able to enjoy the things you used to enjoy.  Desire  to be by oneself all the time (social isolation).  Lack of energy or motivation.  Difficulty concentrating or remembering.  Change in appetite or weight.  Restlessness or agitation. Symptoms of clinical depression include the same symptoms of normal sadness or normal grief and also the following symptoms:  Feeling sad or crying all the time.  Feelings of guilt or worthlessness.  Feelings of hopelessness or helplessness.  Thoughts of suicide or the desire to harm yourself (suicidal ideation).  Loss of touch with reality (psychotic symptoms). Seeing or hearing things that are not real (hallucinations) or having false beliefs about your life or the people around you (delusions and paranoia). DIAGNOSIS  The diagnosis of clinical depression is usually based on how bad the symptoms are and how long they have lasted. Your health care provider will also ask you questions about your medical history and substance use to find out if physical illness, use of prescription medicine, or substance abuse is causing your depression. Your health care provider may also order blood tests. TREATMENT  Often, normal sadness and normal grief do not require treatment. However, sometimes antidepressant medicine is given for bereavement to ease the depressive symptoms until they resolve. The treatment for clinical depression depends on how bad the symptoms are but often includes antidepressant medicine, counseling with a mental health professional, or both. Your health care provider will help to determine what treatment is best for you. Depression caused by physical illness usually goes away with appropriate medical treatment of the illness. If prescription medicine is causing depression, talk with your health care provider about stopping the medicine, decreasing the dose, or changing to another medicine. Depression caused by the abuse of alcohol or illicit drugs goes away when you stop using these substances. Some  adults need professional help in order to stop drinking or using drugs. SEEK IMMEDIATE MEDICAL CARE IF:  You have thoughts about hurting yourself or others.  You lose touch with reality (have psychotic symptoms).  You are taking medicine for depression and have a serious side effect. FOR MORE INFORMATION  National Alliance on Mental Illness: www.nami.[employer] of [employer]: https://carter.com/ Document Released: 01/11/2000 Document Revised: 05/30/2013 Document Reviewed: 04/14/2011 [facility] Patient Information 2015 [place], [place]. This information is not intended to replace advice given to you by your health care provider. Make sure you discuss any questions you have with your health care provider.  Behavioral Health Resources in the [facility]  Intensive Outpatient Programs: [facility]      [place], [place] Both a day and evening program       [facility] Outpatient     [address]        [place], [place] [address] [phone]         ADS: [facility] [place]: [phone] or [phone] [address], [place] [address] [email or web address]  Mobile Crisis Teams:  [facility]         Mobile Crisis Care Unit [phone]             [facility] [place] [address] [place] [phone]  Self-Help/Support Groups: [facility] [employer] [facility] [employer] of support groups [phone] (call for more info)  Narcotics Anonymous (NA) [facility] [address] [place] - 2 meetings at this location  Residential Treatment Programs:  [place]       [place] [address], [place], [place]  [address] [place]  [address] [place], [place] [address] [phone] Admissions: 8am-3pm M-F  [facility] [place]     [address], [place] [address]       [phone]         [place] [address] Jadene Pierini [place], [place]  [facility] [address] [place], [place]  [facility] - Intensive Outpatient      [address] [place] [address]     [place], [place]         [facility] [facility] ([place]     [place], [place] or [phone]  Residential Treatment Services (RTS)  [place], [place]  [facility] [address]                                               [place]  [facility] Resources: [phone]               General Therapy                                                Domenic Schwab, PhD        [address] [place], [place] [address]         [facility]   [address] [place], [place] [address] [phone]  [facility] Recovery [address] [place], [place] [address] [phone] Insurance/Medicaid/sponsorship through [place] [facility]                                              [place]  [place], [place] [address]    Therapy/tele-psych/case         [place], [place]  [address]  Adolescent/group home/case management [phone]                                           Rosette Reveal PhD       General  therapy       Insurance   [phone]         Dr. Adele Schilder Insurance [address] [place] [facility]/Residential Medicaid, sponsorship [phone]

## 2013-09-29 NOTE — ED Notes (Signed)
[phone] Chante daughter incase of discharge or inpatient admission.

## 2013-10-05 ENCOUNTER — Ambulatory Visit
Admission: RE | Admit: 2013-10-05 | Discharge: 2013-10-05 | Disposition: A | Discharge status: Home / Self Care | Payer: Medicare Other | Source: Ambulatory Visit | Attending: Internal Medicine | Admitting: Internal Medicine

## 2013-10-05 DIAGNOSIS — R928 Other abnormal and inconclusive findings on diagnostic imaging of breast: Secondary | ICD-10-CM

## 2013-11-02 ENCOUNTER — Encounter: Payer: Self-pay | Admitting: Physical Medicine & Rehabilitation

## 2013-11-04 ENCOUNTER — Encounter: Payer: Self-pay | Admitting: Physical Medicine & Rehabilitation

## 2013-11-04 ENCOUNTER — Ambulatory Visit (HOSPITAL_BASED_OUTPATIENT_CLINIC_OR_DEPARTMENT_OTHER): Payer: Medicare Other | Admitting: Physical Medicine & Rehabilitation

## 2013-11-04 ENCOUNTER — Encounter: Payer: Medicare Other | Attending: Physical Medicine & Rehabilitation

## 2013-11-04 VITALS — BP 149/99 | HR 89 | Resp 14 | Ht 62.0 in | Wt 143.2 lb

## 2013-11-04 DIAGNOSIS — Z79899 Other long term (current) drug therapy: Secondary | ICD-10-CM

## 2013-11-04 DIAGNOSIS — G894 Chronic pain syndrome: Secondary | ICD-10-CM | POA: Insufficient documentation

## 2013-11-04 DIAGNOSIS — Z5181 Encounter for therapeutic drug level monitoring: Secondary | ICD-10-CM | POA: Diagnosis present

## 2013-11-04 DIAGNOSIS — M25561 Pain in right knee: Secondary | ICD-10-CM

## 2013-11-04 DIAGNOSIS — M25562 Pain in left knee: Secondary | ICD-10-CM | POA: Diagnosis not present

## 2013-11-04 DIAGNOSIS — M47816 Spondylosis without myelopathy or radiculopathy, lumbar region: Secondary | ICD-10-CM | POA: Diagnosis not present

## 2013-11-04 MED ORDER — DICLOFENAC SODIUM 1 % TD GEL: 2.0000 g | Freq: Four times a day (QID) | TRANSDERMAL | Status: DC
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
## 2013-11-10 ENCOUNTER — Other Ambulatory Visit: Payer: Self-pay | Admitting: *Deleted

## 2013-11-10 ENCOUNTER — Telehealth: Payer: Self-pay | Admitting: *Deleted

## 2013-11-10 NOTE — Telephone Encounter (Signed)
Urine drug screen collected on 11/04/13 at new pt appt was not processed due to creatinine level and pH levels out of range which points to dilution.  By your note it looks like you did not intend on prescribing CIIs but FYI

## 2013-11-10 NOTE — Telephone Encounter (Signed)
Looks like a tramadol only pt anyway

## 2013-12-01 ENCOUNTER — Ambulatory Visit
Admission: RE | Admit: 2013-12-01 | Discharge: 2013-12-01 | Disposition: A | Discharge status: Home / Self Care | Payer: Medicare Other | Source: Ambulatory Visit | Attending: Physical Medicine & Rehabilitation | Admitting: Physical Medicine & Rehabilitation

## 2013-12-01 DIAGNOSIS — M47816 Spondylosis without myelopathy or radiculopathy, lumbar region: Secondary | ICD-10-CM

## 2013-12-01 DIAGNOSIS — M25562 Pain in left knee: Principal | ICD-10-CM

## 2013-12-01 DIAGNOSIS — M25561 Pain in right knee: Secondary | ICD-10-CM

## 2013-12-02 ENCOUNTER — Ambulatory Visit (HOSPITAL_BASED_OUTPATIENT_CLINIC_OR_DEPARTMENT_OTHER): Payer: Medicare Other | Admitting: Physical Medicine & Rehabilitation

## 2013-12-02 ENCOUNTER — Encounter: Payer: Self-pay | Admitting: Physical Medicine & Rehabilitation

## 2013-12-02 ENCOUNTER — Encounter: Payer: Medicare Other | Attending: Physical Medicine & Rehabilitation

## 2013-12-02 VITALS — BP 136/88 | HR 97 | Resp 14 | Ht 62.0 in | Wt 141.0 lb

## 2013-12-02 DIAGNOSIS — M545 Low back pain, unspecified: Secondary | ICD-10-CM

## 2013-12-02 DIAGNOSIS — M25562 Pain in left knee: Secondary | ICD-10-CM

## 2013-12-02 DIAGNOSIS — M47816 Spondylosis without myelopathy or radiculopathy, lumbar region: Secondary | ICD-10-CM | POA: Insufficient documentation

## 2013-12-02 DIAGNOSIS — Z79899 Other long term (current) drug therapy: Secondary | ICD-10-CM | POA: Insufficient documentation

## 2013-12-02 DIAGNOSIS — M25561 Pain in right knee: Secondary | ICD-10-CM

## 2013-12-02 DIAGNOSIS — G894 Chronic pain syndrome: Secondary | ICD-10-CM | POA: Diagnosis present

## 2013-12-02 DIAGNOSIS — Z5181 Encounter for therapeutic drug level monitoring: Secondary | ICD-10-CM | POA: Insufficient documentation

## 2013-12-02 NOTE — Progress Notes (Signed)
Subjective:    Patient ID: Victoria Flores, female    DOB: December 26, 1965, 48 y.o.   MRN: [id number]  HPI Diclofenac gel is helpful but only uses at night  Continues have knee pain and back pain. Did not have physical therapy.  Denies any flank pain no urinary symptoms. Pain Inventory Average Pain 10 Pain Right Now 10 My pain is constant, sharp and aching  In the last 24 hours, has pain interfered with the following? General activity 2 Relation with others 2 Enjoyment of life 2 What TIME of day is your pain at its worst? ALL Sleep (in general) Poor  Pain is worse with: walking, bending, sitting and standing Pain improves with: medication Relief from Meds: 2  Mobility use a cane how many minutes can you walk? 10 MINUTES ability to climb steps?  no do you drive?  no  Function disabled: date disabled .  Neuro/Psych weakness trouble walking spasms depression anxiety  Prior Studies Any changes since last visit?  no  Physicians involved in your care Any changes since last visit?  no   Family History  Problem Relation Age of Onset  . Breast cancer Sister   . Diabetes Brother    History   Social History  . Marital Status: Single    Spouse Name: N/A    Number of Children: N/A  . Years of Education: N/A   Social History Main Topics  . Smoking status: Current Some Day Smoker -- 0.50 packs/day for 21 years  . Smokeless tobacco: None  . Alcohol Use: No  . Drug Use: No     Comment: Abusing pain medications  . Sexual Activity: Yes   Other Topics Concern  . None   Social History Narrative   Past Surgical History  Procedure Laterality Date  . Cesarean section     Past Medical History  Diagnosis Date  . Arthritis   . Prolapsed disk   . Heart murmur   . Sleep deprivation   . Depression   . Sickle cell trait   . Hypothyroid   . Anxiety    BP 136/88 mmHg  Pulse 97  Resp 14  Ht 5\' 2"  (1.575 m)  Wt 141 lb (63.957 kg)  BMI 25.78 kg/m2  SpO2  98%  Opioid Risk Score:   Fall Risk Score: Low Fall Risk (0-5 points)  Review of Systems     Objective:   Physical Exam  Constitutional: She is oriented to person, place, and time. She appears well-developed and well-nourished.  Musculoskeletal:       Right knee: She exhibits normal range of motion and no swelling. Tenderness found. Patellar tendon tenderness noted.       Left knee: She exhibits normal range of motion and no swelling. Tenderness found. Patellar tendon tenderness noted.       Lumbar back: She exhibits decreased range of motion and pain. She exhibits no tenderness.  Pain with lumbar extension  Neurological: She is alert and oriented to person, place, and time.  Psychiatric: She has a normal mood and affect.  Nursing note and vitals reviewed.         Assessment & Plan:  1. Lumbar pain chronic. Last x-rays unremarkable. Has not had imaging studies for number of years.  It is unclear why she has such limited mobility used on previous films. No clinical evidence of radiculopathy. X-rays show no evidence of disc space narrowing no spondylolisthesis, right renal calculus which has been previously visualized. Does not  appear to be symptomatic  Referral to outpatient physical therapy for lumbar stabilization  If patient fails this treatment may consider MRI of the lumbar spine If there are any neurologic exam changes  I do not see any indication for Schedule 2 or 3 narcotic analgesic pain medications. Patient is intolerant of nonsteroidal anti-inflammatories. Could consider tramadol  2. Bilateral knee pain unclear etiology however does not appear to have any severe swelling or joint deformity. Suspect soft tissue etiology. Will prescribe diclofenac gel.

## 2013-12-02 NOTE — Patient Instructions (Signed)
Increase voltaren gel to 4 times a day   

## 2014-01-13 ENCOUNTER — Encounter: Payer: Medicare Other | Attending: Physical Medicine & Rehabilitation

## 2014-01-13 ENCOUNTER — Ambulatory Visit: Payer: Medicare Other | Admitting: Physical Medicine & Rehabilitation

## 2014-01-13 DIAGNOSIS — M25561 Pain in right knee: Secondary | ICD-10-CM | POA: Insufficient documentation

## 2014-01-13 DIAGNOSIS — G894 Chronic pain syndrome: Secondary | ICD-10-CM | POA: Insufficient documentation

## 2014-01-13 DIAGNOSIS — Z5181 Encounter for therapeutic drug level monitoring: Secondary | ICD-10-CM | POA: Insufficient documentation

## 2014-01-13 DIAGNOSIS — M47816 Spondylosis without myelopathy or radiculopathy, lumbar region: Secondary | ICD-10-CM | POA: Insufficient documentation

## 2014-01-13 DIAGNOSIS — Z79899 Other long term (current) drug therapy: Secondary | ICD-10-CM | POA: Insufficient documentation

## 2014-01-13 DIAGNOSIS — M25562 Pain in left knee: Secondary | ICD-10-CM | POA: Insufficient documentation

## 2014-04-24 ENCOUNTER — Emergency Department (HOSPITAL_COMMUNITY)
Admission: EM | Admit: 2014-04-24 | Discharge: 2014-04-24 | Disposition: A | Discharge status: Home / Self Care | Payer: Medicare Other | Attending: Emergency Medicine | Admitting: Emergency Medicine

## 2014-04-24 ENCOUNTER — Encounter (HOSPITAL_COMMUNITY): Payer: Self-pay | Admitting: Emergency Medicine

## 2014-04-24 DIAGNOSIS — F419 Anxiety disorder, unspecified: Secondary | ICD-10-CM | POA: Insufficient documentation

## 2014-04-24 DIAGNOSIS — R011 Cardiac murmur, unspecified: Secondary | ICD-10-CM | POA: Diagnosis not present

## 2014-04-24 DIAGNOSIS — M1990 Unspecified osteoarthritis, unspecified site: Secondary | ICD-10-CM | POA: Insufficient documentation

## 2014-04-24 DIAGNOSIS — Z720 Tobacco use: Secondary | ICD-10-CM | POA: Diagnosis not present

## 2014-04-24 DIAGNOSIS — R1110 Vomiting, unspecified: Secondary | ICD-10-CM | POA: Diagnosis present

## 2014-04-24 DIAGNOSIS — E039 Hypothyroidism, unspecified: Secondary | ICD-10-CM | POA: Insufficient documentation

## 2014-04-24 DIAGNOSIS — F329 Major depressive disorder, single episode, unspecified: Secondary | ICD-10-CM | POA: Insufficient documentation

## 2014-04-24 DIAGNOSIS — Z79899 Other long term (current) drug therapy: Secondary | ICD-10-CM | POA: Insufficient documentation

## 2014-04-24 DIAGNOSIS — Z862 Personal history of diseases of the blood and blood-forming organs and certain disorders involving the immune mechanism: Secondary | ICD-10-CM | POA: Diagnosis not present

## 2014-04-24 DIAGNOSIS — F1193 Opioid use, unspecified with withdrawal: Secondary | ICD-10-CM

## 2014-04-24 DIAGNOSIS — F1123 Opioid dependence with withdrawal: Secondary | ICD-10-CM

## 2014-04-24 MED ORDER — OXYCODONE-ACETAMINOPHEN 5-325 MG PO TABS
2.0000 | ORAL_TABLET | Freq: Once | ORAL | Status: AC
  Administered 2014-04-24: 2 via ORAL
  Filled 2014-04-24: qty 2

## 2014-04-24 MED ORDER — MORPHINE SULFATE 4 MG/ML IJ SOLN
4.0000 mg | Freq: Once | INTRAMUSCULAR | Status: AC
  Administered 2014-04-24: 4 mg via INTRAVENOUS
  Filled 2014-04-24: qty 1

## 2014-04-24 MED ORDER — LOPERAMIDE HCL 2 MG PO CAPS: 2.0000 mg | ORAL_CAPSULE | Freq: Four times a day (QID) | ORAL | Status: DC | PRN

## 2014-04-24 MED ORDER — METHOCARBAMOL 500 MG PO TABS: 500.0000 mg | ORAL_TABLET | Freq: Two times a day (BID) | ORAL | Status: DC

## 2014-04-24 MED ORDER — CLONIDINE HCL 0.2 MG PO TABS: 0.2000 mg | ORAL_TABLET | Freq: Two times a day (BID) | ORAL | Status: DC

## 2014-04-24 MED ORDER — METHOCARBAMOL 500 MG PO TABS
500.0000 mg | ORAL_TABLET | Freq: Once | ORAL | Status: AC
  Administered 2014-04-24: 500 mg via ORAL
  Filled 2014-04-24: qty 1

## 2014-04-24 MED ORDER — ONDANSETRON HCL 4 MG PO TABS: 4.0000 mg | ORAL_TABLET | Freq: Four times a day (QID) | ORAL | Status: DC

## 2014-04-24 MED ORDER — SODIUM CHLORIDE 0.9 % IV BOLUS (SEPSIS)
1000.0000 mL | Freq: Once | INTRAVENOUS | Status: AC
  Administered 2014-04-24: 1000 mL via INTRAVENOUS

## 2014-04-24 MED ORDER — ONDANSETRON HCL 4 MG/2ML IJ SOLN
4.0000 mg | Freq: Once | INTRAMUSCULAR | Status: AC
  Administered 2014-04-24: 4 mg via INTRAVENOUS
  Filled 2014-04-24: qty 2

## 2014-04-24 MED ORDER — CLONIDINE HCL 0.1 MG PO TABS
0.2000 mg | ORAL_TABLET | Freq: Once | ORAL | Status: AC
  Administered 2014-04-24: 0.2 mg via ORAL
  Filled 2014-04-24: qty 2

## 2014-04-24 NOTE — ED Notes (Signed)
Pt has been taking narcotics/hydrocodone for several years and was given narcan to help her stop taking them. She has not taken hydrocodone since yesterday. Took narcan today. Now c/o abdominal pain, n/v/d. Alert and oriented. Emotional at this time.

## 2014-04-24 NOTE — Discharge Instructions (Signed)
Opioid Withdrawal Opioids are a group of narcotic drugs. They include the street drug heroin. They also include pain medicines, such as morphine, hydrocodone, oxycodone, and fentanyl. Opioid withdrawal is a group of characteristic physical and mental signs and symptoms. It typically occurs if you have been using opioids daily for several weeks or longer and stop using or rapidly decrease use. Opioid withdrawal can also occur if you have used opioids daily for a long time and are given a medicine to block the effect.  SIGNS AND SYMPTOMS Opioid withdrawal includes three or more of the following symptoms:   Depressed, anxious, or irritable mood.  Nausea or vomiting.  Muscle aches or spasms.   Watery eyes.   Runny nose.  Dilated pupils, sweating, or hairs standing on end.  Diarrhea or intestinal cramping.  Yawning.   Fever.  Increased blood pressure.  Fast pulse.  Restlessness or trouble sleeping. These signs and symptoms occur within several hours of stopping or reducing short-acting opioids, such as heroin. They can occur within 3 days of stopping or reducing long-acting opioids, such as methadone. Withdrawal begins within minutes of receiving a drug that blocks the effects of opioids, such as naltrexone or naloxone. DIAGNOSIS  Opioid use disorder is diagnosed by your health care provider. You will be asked about your symptoms, drug and alcohol use, medical history, and use of medicines. A physical exam may be done. Lab tests may be ordered. Your health care provider may have you see a mental health professional.  TREATMENT  The treatment for opioid withdrawal is usually provided by medical doctors with special training in substance use disorders (addiction specialists). The following medicines may be included in treatment:  Opioids given in place of the abused opioid. They turn on opioid receptors in the brain and lessen or prevent withdrawal symptoms. They are gradually  decreased (opioid substitution and taper).  Non-opioids that can lessen certain opioid withdrawal symptoms. They may be used alone or with opioid substitution and taper. Successful long-term recovery usually requires medicine, counseling, and group support. HOME CARE INSTRUCTIONS   Take medicines only as directed by your health care provider.  Check with your health care provider before starting new medicines.  Keep all follow-up visits as directed by your health care provider. SEEK MEDICAL CARE IF:  You are not able to take your medicines as directed.  Your symptoms get worse.  You relapse. SEEK IMMEDIATE MEDICAL CARE IF:  You have serious thoughts about hurting yourself or others.  You have a seizure.  You lose consciousness. Document Released: 01/16/2003 Document Revised: 05/30/2013 Document Reviewed: 01/26/2013 [facility] Patient Information 2015 [place], [place]. This information is not intended to replace advice given to you by your health care provider. Make sure you discuss any questions you have with your health care provider.   Opioid Use Disorder Opioid use disorder is a mental disorder. It is the continued nonmedical use of opioids in spite of risks to health and well-being. Misused opioids include the street drug heroin. They also include pain medicines such as morphine, hydrocodone, oxycodone, and fentanyl. Opioids are very addictive. People who misuse opioids get an exaggerated feeling of well-being. Opioid use disorder often disrupts activities at home, work, or school. It may cause mental or physical problems.  A family history of opioid use disorder puts you at higher risk of it. People with opioid use disorder often misuse other drugs or have mental illness such as depression, posttraumatic stress disorder, or antisocial personality disorder. They also are  at risk of suicide and death from overdose. SIGNS AND SYMPTOMS  Signs and symptoms of opioid use disorder  include:  Use of opioids in larger amounts or over a longer period than intended.  Unsuccessful attempts to cut down or control opioid use.  A lot of time spent obtaining, using, or recovering from the effects of opioids.  A strong desire or urge to use opioids (craving).  Continued use of opioids in spite of major problems at work, school, or home because of use.  Continued use of opioids in spite of relationship problems because of use.  Giving up or cutting down on important life activities because of opioid use.  Use of opioids over and over in situations when it is physically hazardous, such as driving a car.  Continued use of opioids in spite of a physical problem that is likely related to use. Physical problems can include:  Severe constipation.  Poor nutrition.  Infertility.  Tuberculosis.  Aspiration pneumonia.  Infections such as human immunodeficiency virus (HIV) and hepatitis (from injecting opioids).  Continued use of opioids in spite of a mental problem that is likely related to use. Mental problems can include:  Depression.  Anxiety.  Hallucinations.  Sleep problems.  Loss of sexual function.  Need to use more and more opioids to get the same effect, or lessened effect over time with use of the same amount (tolerance).  Having withdrawal symptoms when opioid use is stopped, or using opioids to reduce or avoid withdrawal symptoms. Withdrawal symptoms include:  Depressed, anxious, or irritable mood.  Nausea, vomiting, diarrhea, or intestinal cramping.  Muscle aches or spasms.  Excessive tearing or runny nose.  Dilated pupils, sweating, or hairs standing on end.  Yawning.  Fever, raised blood pressure, or fast pulse.  Restlessness or trouble sleeping. This does not apply to people taking opioids for medical reasons only. DIAGNOSIS Opioid use disorder is diagnosed by your health care provider. You may be asked questions about your opioid use  and and how it affects your life. A physical exam may be done. A drug screen may be ordered. You may be referred to a mental health professional. The diagnosis of opioid use disorder requires at least two symptoms within 12 months. The type of opioid use disorder you have depends on the number of signs and symptoms you have. The type may be:  Mild. Two or three signs and symptoms.   Moderate. Four or five signs and symptoms.   Severe. Six or more signs and symptoms. TREATMENT  Treatment is usually provided by mental health professionals with training in substance use disorders.The following options are available:  Detoxification.This is the first step in treatment for withdrawal. It is medically supervised withdrawal with the use of medicines. These medicines lessen withdrawal symptoms. They also raise the chance of becoming opioid free.  Counseling, also known as talk therapy. Talk therapy addresses the reasons you use opioids. It also addresses ways to keep you from using again (relapse). The goals of talk therapy are to avoid relapse by:  Identifying and avoiding triggers for use.  Finding healthy ways to cope with stress.  Learning how to handle cravings.  Support groups. Support groups provide emotional support, advice, and guidance.  A medicine that blocks opioid receptors in your brain. This medicine can reduce opioid cravings that lead to relapse. This medicine also blocks the desired opioid effect when relapse occurs.  Opioids that are taken by mouth in place of the misused opioid (opioid  maintenance treatment). These medicines satisfy cravings but are safer than commonly misused opioids. This often is the best option for people who continue to relapse with other treatments. HOME CARE INSTRUCTIONS   Take medicines only as directed by your health care provider.  Check with your health care provider before starting new medicines.  Keep all follow-up visits as directed by  your health care provider. SEEK MEDICAL CARE IF:  You are not able to take your medicines as directed.  Your symptoms get worse. SEEK IMMEDIATE MEDICAL CARE IF:  You have serious thoughts about hurting yourself or others.  You may have taken an overdose of opioids. [place] on Drug Abuse: motorcyclefax.com  Substance Abuse and Mental Health Services Administration: ktimeonline.com Document Released: 11/10/2006 Document Revised: 05/30/2013 Document Reviewed: 01/26/2013 [facility] Patient Information 2015 [place], [place]. This information is not intended to replace advice given to you by your health care provider. Make sure you discuss any questions you have with your health care provider.

## 2014-04-24 NOTE — ED Notes (Signed)
Tried to get temp form the pt, but she kept screaming and moving around on the stretcher. Informed the nurse that I could not get the temp from the pt.

## 2014-04-24 NOTE — ED Provider Notes (Signed)
Heart is what really happened is CSN: [id number]     Arrival date & time 04/24/14  1858 History   First MD Initiated Contact with Patient 04/24/14 1922     Chief Complaint  Patient presents with  . Abdominal Pain  . Emesis     (Consider location/radiation/quality/duration/timing/severity/associated sxs/prior Treatment) HPI Comments: 7 onset generalized abdominal pain, nausea, vomiting, diarrhea and chills after receiving by mouth Narcan in her psychiatrist's office today to try to get off of her chronic narcotics.   Patient is a 49 y.o. female presenting with abdominal pain and vomiting. The history is provided by the patient. No language interpreter was used.  Abdominal Pain Associated symptoms: chills and vomiting   Associated symptoms: no chest pain, no cough, no diarrhea, no dysuria, no fatigue, no fever, no nausea, no shortness of breath and no sore throat   Emesis Associated symptoms: abdominal pain and chills   Associated symptoms: no arthralgias, no diarrhea, no headaches and no sore throat     Past Medical History  Diagnosis Date  . Arthritis   . Prolapsed disk   . Heart murmur   . Sleep deprivation   . Depression   . Sickle cell trait   . Hypothyroid   . Anxiety    Past Surgical History  Procedure Laterality Date  . Cesarean section     Family History  Problem Relation Age of Onset  . Breast cancer Sister   . Diabetes Brother    History  Substance Use Topics  . Smoking status: Current Some Day Smoker -- 0.50 packs/day for 21 years  . Smokeless tobacco: Not on file  . Alcohol Use: No   OB History    No data available     Review of Systems  Constitutional: Positive for chills. Negative for fever, diaphoresis, activity change, appetite change and fatigue.  HENT: Negative for congestion, facial swelling, rhinorrhea and sore throat.   Eyes: Negative for photophobia and discharge.  Respiratory: Negative for cough, chest tightness and shortness of breath.    Cardiovascular: Negative for chest pain, palpitations and leg swelling.  Gastrointestinal: Positive for vomiting and abdominal pain. Negative for nausea and diarrhea.  Endocrine: Negative for polydipsia and polyuria.  Genitourinary: Negative for dysuria, frequency, difficulty urinating and pelvic pain.  Musculoskeletal: Negative for back pain, arthralgias, neck pain and neck stiffness.  Skin: Negative for color change and wound.  Allergic/Immunologic: Negative for immunocompromised state.  Neurological: Negative for facial asymmetry, weakness, numbness and headaches.  Hematological: Does not bruise/bleed easily.  Psychiatric/Behavioral: Positive for agitation. Negative for confusion.      Allergies  Garlic  Home Medications   Prior to Admission medications   Medication Sig Start Date End Date Taking? Authorizing Provider  carbamazepine (CARBATROL) 200 MG 12 hr capsule Take 200 mg by mouth 2 (two) times daily.   Yes Historical Provider, MD  clonazePAM (KLONOPIN) 2 MG tablet Take 2 mg by mouth 2 (two) times daily.   Yes Historical Provider, MD  cloNIDine (CATAPRES) 0.2 MG tablet Take 1 tablet (0.2 mg total) by mouth 2 (two) times daily. 04/24/14   Ernestina Patches, MD  diclofenac sodium (VOLTAREN) 1 % GEL Apply 2 g topically 4 (four) times daily. Patient not taking: Reported on 04/24/2014 11/04/13   Charlett Blake, MD  dicyclomine (BENTYL) 20 MG tablet Take 1 tablet (20 mg total) by mouth every 6 (six) hours as needed for spasms (for abdominal cramping). Patient not taking: Reported on 04/24/2014 09/29/13   Michelene Heady  Sharol Given, MD  FLUoxetine (PROZAC) 20 MG capsule Take 40 mg by mouth daily.   Yes Historical Provider, MD  gabapentin (NEURONTIN) 300 MG capsule Take 300 mg by mouth 3 (three) times daily.   Yes Historical Provider, MD  levothyroxine (SYNTHROID, LEVOTHROID) 75 MCG tablet Take 1 tablet (75 mcg total) by mouth daily. 12/19/11  Yes Janece Canterbury, MD  loperamide (IMODIUM) 2 MG capsule  Take 1 capsule (2 mg total) by mouth 4 (four) times daily as needed for diarrhea or loose stools. 04/24/14   Ernestina Patches, MD  megestrol (MEGACE ES) 625 MG/5ML suspension Take 625 mg by mouth daily.   Yes Historical Provider, MD  methocarbamol (ROBAXIN) 500 MG tablet Take 1 tablet (500 mg total) by mouth 2 (two) times daily. 04/24/14   Ernestina Patches, MD  mirtazapine (REMERON) 30 MG tablet Take 30 mg by mouth at bedtime.   Yes Historical Provider, MD  naltrexone (DEPADE) 50 MG tablet Take 50 mg by mouth daily.   Yes Historical Provider, MD  ondansetron (ZOFRAN) 4 MG tablet Take 1 tablet (4 mg total) by mouth every 6 (six) hours. 04/24/14   Ernestina Patches, MD  ondansetron (ZOFRAN-ODT) 8 MG disintegrating tablet Take 1 tablet (8 mg total) by mouth every 4 (four) hours as needed for nausea. Patient not taking: Reported on 04/24/2014 09/29/13   Linton Flemings, MD  promethazine (PHENERGAN) 25 MG suppository Place 1 suppository (25 mg total) rectally every 6 (six) hours as needed for nausea or vomiting. Patient not taking: Reported on 04/24/2014 09/29/13   Linton Flemings, MD  promethazine (PHENERGAN) 25 MG tablet Take 1 tablet (25 mg total) by mouth every 6 (six) hours as needed for nausea. Patient not taking: Reported on 04/24/2014 09/29/13   Linton Flemings, MD  QUEtiapine (SEROQUEL) 200 MG tablet Take 200 mg by mouth at bedtime.   Yes Historical Provider, MD  risperidone (RISPERDAL) 4 MG tablet Take 1 tablet (4 mg total) by mouth daily. Patient not taking: Reported on 04/24/2014 12/19/11   Janece Canterbury, MD  traMADol (ULTRAM) 50 MG tablet Take 50 mg by mouth every 6 (six) hours as needed for moderate pain or severe pain.   Yes Historical Provider, MD   BP 144/46 mmHg  Pulse 50  Temp(Src) 98 F (36.7 C) (Oral)  Resp 18  SpO2 100% Physical Exam  Constitutional: She is oriented to person, place, and time. She appears well-developed and well-nourished. No distress.  HENT:  Head: Normocephalic and atraumatic.   Mouth/Throat: No oropharyngeal exudate.  Eyes: Pupils are equal, round, and reactive to light.  Neck: Normal range of motion. Neck supple.  Cardiovascular: Normal rate, regular rhythm and normal heart sounds.  Exam reveals no gallop and no friction rub.   No murmur heard. Pulmonary/Chest: Effort normal and breath sounds normal. No respiratory distress. She has no wheezes. She has no rales.  Abdominal: Soft. Bowel sounds are normal. She exhibits no distension and no mass. There is generalized tenderness. There is no rigidity, no rebound, no guarding, no tenderness at McBurney's point and negative Murphy's sign.  Musculoskeletal: Normal range of motion. She exhibits no edema or tenderness.  Neurological: She is alert and oriented to person, place, and time.  Skin: Skin is warm and dry.  Psychiatric: She has a normal mood and affect.    ED Course  Procedures (including critical care time) Labs Review Labs Reviewed - No data to display  Imaging Review No results found.   EKG Interpretation None  MDM   Final diagnoses:  Opiate withdrawal    Pt is a 49 y.o. female with Pmhx as above who presents with abdominal pain, nausea, vomiting, diarrhea and chills since her psychiatrist gave her Narcan.  This afternoon because per patient, she wanted to get off her narcotics which she has been taking chronically for several years.  Vitals are stable.  Patient is uncomfortable but no acute distress.  Abdomen is soft.  Patient is rolling around in the bed, screaming.  I've had discussion with her about treatment with non-narcotics, she is requesting a narcotic and understands that this will not help her get off of her narcotics.  She states that she doesn't care and she wants the narcotics She will be given one dose here. Marland Kitchen  She will not receive a prescription for narcotics at time of discharge and is agrees.     as  Ernestina Patches, MD 04/25/14 0100

## 2014-09-08 ENCOUNTER — Encounter (HOSPITAL_COMMUNITY): Payer: Self-pay | Admitting: Emergency Medicine

## 2014-09-08 ENCOUNTER — Emergency Department (HOSPITAL_COMMUNITY): Payer: Medicare Other

## 2014-09-08 ENCOUNTER — Inpatient Hospital Stay (HOSPITAL_COMMUNITY)
Admission: EM | Admit: 2014-09-08 | Discharge: 2014-09-13 | DRG: 872 | Disposition: A | Discharge status: Home / Self Care | Payer: Medicare Other | Attending: Internal Medicine | Admitting: Internal Medicine

## 2014-09-08 DIAGNOSIS — Z803 Family history of malignant neoplasm of breast: Secondary | ICD-10-CM

## 2014-09-08 DIAGNOSIS — R110 Nausea: Secondary | ICD-10-CM

## 2014-09-08 DIAGNOSIS — B9620 Unspecified Escherichia coli [E. coli] as the cause of diseases classified elsewhere: Secondary | ICD-10-CM | POA: Diagnosis present

## 2014-09-08 DIAGNOSIS — Z79891 Long term (current) use of opiate analgesic: Secondary | ICD-10-CM | POA: Diagnosis not present

## 2014-09-08 DIAGNOSIS — M1990 Unspecified osteoarthritis, unspecified site: Secondary | ICD-10-CM | POA: Diagnosis present

## 2014-09-08 DIAGNOSIS — N12 Tubulo-interstitial nephritis, not specified as acute or chronic: Secondary | ICD-10-CM | POA: Diagnosis present

## 2014-09-08 DIAGNOSIS — R109 Unspecified abdominal pain: Secondary | ICD-10-CM

## 2014-09-08 DIAGNOSIS — F329 Major depressive disorder, single episode, unspecified: Secondary | ICD-10-CM | POA: Diagnosis present

## 2014-09-08 DIAGNOSIS — Z833 Family history of diabetes mellitus: Secondary | ICD-10-CM | POA: Diagnosis not present

## 2014-09-08 DIAGNOSIS — F411 Generalized anxiety disorder: Secondary | ICD-10-CM | POA: Diagnosis present

## 2014-09-08 DIAGNOSIS — D573 Sickle-cell trait: Secondary | ICD-10-CM | POA: Diagnosis present

## 2014-09-08 DIAGNOSIS — A4151 Sepsis due to Escherichia coli [E. coli]: Secondary | ICD-10-CM | POA: Diagnosis not present

## 2014-09-08 DIAGNOSIS — R112 Nausea with vomiting, unspecified: Secondary | ICD-10-CM | POA: Diagnosis present

## 2014-09-08 DIAGNOSIS — D696 Thrombocytopenia, unspecified: Secondary | ICD-10-CM | POA: Diagnosis present

## 2014-09-08 DIAGNOSIS — E039 Hypothyroidism, unspecified: Secondary | ICD-10-CM | POA: Diagnosis present

## 2014-09-08 DIAGNOSIS — A419 Sepsis, unspecified organism: Principal | ICD-10-CM | POA: Diagnosis present

## 2014-09-08 DIAGNOSIS — F17200 Nicotine dependence, unspecified, uncomplicated: Secondary | ICD-10-CM | POA: Diagnosis present

## 2014-09-08 DIAGNOSIS — F32A Depression, unspecified: Secondary | ICD-10-CM | POA: Diagnosis present

## 2014-09-08 DIAGNOSIS — R7881 Bacteremia: Secondary | ICD-10-CM | POA: Diagnosis not present

## 2014-09-08 DIAGNOSIS — R0600 Dyspnea, unspecified: Secondary | ICD-10-CM

## 2014-09-08 HISTORY — DX: Disorder of kidney and ureter, unspecified: N28.9

## 2014-09-08 LAB — BASIC METABOLIC PANEL
ANION GAP: 12 (ref 5–15)
BUN: 10 mg/dL (ref 6–20)
CO2: 22 mmol/L (ref 22–32)
CREATININE: 1.03 mg/dL — AB (ref 0.44–1.00)
Calcium: 9 mg/dL (ref 8.9–10.3)
Chloride: 105 mmol/L (ref 101–111)
GFR calc non Af Amer: >60 mL/min (ref >60)
Glucose, Bld: 132 mg/dL — ABNORMAL HIGH (ref 65–99)
Potassium: 3.6 mmol/L (ref 3.5–5.1)
Sodium: 139 mmol/L (ref 135–145)

## 2014-09-08 LAB — I-STAT CG4 LACTIC ACID, ED: LACTIC ACID, VENOUS: 3.11 mmol/L — AB (ref 0.5–2.0)

## 2014-09-08 LAB — URINALYSIS, ROUTINE W REFLEX MICROSCOPIC
Bilirubin Urine: NEGATIVE
Glucose, UA: NEGATIVE mg/dL
Ketones, ur: NEGATIVE mg/dL
NITRITE: POSITIVE — AB
PROTEIN: 100 mg/dL — AB
Specific Gravity, Urine: 1.013 (ref 1.005–1.030)
Urobilinogen, UA: 0.2 mg/dL (ref 0.0–1.0)
pH: 7 (ref 5.0–8.0)

## 2014-09-08 LAB — CBC WITH DIFFERENTIAL/PLATELET
BASOS ABS: 0 10*3/uL (ref 0.0–0.1)
Basophils Relative: 0 % (ref 0–1)
Eosinophils Absolute: 0 10*3/uL (ref 0.0–0.7)
Eosinophils Relative: 0 % (ref 0–5)
HCT: 37 % (ref 36.0–46.0)
HEMOGLOBIN: 12.1 g/dL (ref 12.0–15.0)
LYMPHS PCT: 6 % — AB (ref 12–46)
Lymphs Abs: 1.2 10*3/uL (ref 0.7–4.0)
MCH: 21.8 pg — AB (ref 26.0–34.0)
MCHC: 32.7 g/dL (ref 30.0–36.0)
MCV: 66.8 fL — ABNORMAL LOW (ref 78.0–100.0)
Monocytes Absolute: 1.2 10*3/uL — ABNORMAL HIGH (ref 0.1–1.0)
Monocytes Relative: 6 % (ref 3–12)
NEUTROS ABS: 18.4 10*3/uL — AB (ref 1.7–7.7)
Neutrophils Relative %: 88 % — ABNORMAL HIGH (ref 43–77)
PLATELETS: 182 10*3/uL (ref 150–400)
RBC: 5.54 MIL/uL — ABNORMAL HIGH (ref 3.87–5.11)
RDW: 14.2 % (ref 11.5–15.5)
WBC: 20.8 10*3/uL — ABNORMAL HIGH (ref 4.0–10.5)

## 2014-09-08 LAB — LITHIUM LEVEL: Lithium Lvl: <0.06 mmol/L — ABNORMAL LOW (ref 0.60–1.20)

## 2014-09-08 LAB — I-STAT BETA HCG BLOOD, ED (MC, WL, AP ONLY)

## 2014-09-08 LAB — TSH: TSH: 8.884 u[IU]/mL — AB (ref 0.350–4.500)

## 2014-09-08 LAB — URINE MICROSCOPIC-ADD ON

## 2014-09-08 MED ORDER — HYDROMORPHONE HCL 1 MG/ML IJ SOLN
1.0000 mg | Freq: Once | INTRAMUSCULAR | Status: AC
  Administered 2014-09-08: 1 mg via INTRAVENOUS
  Filled 2014-09-08: qty 1

## 2014-09-08 MED ORDER — LEVOTHYROXINE SODIUM 75 MCG PO TABS
75.0000 ug | ORAL_TABLET | Freq: Every day | ORAL | Status: DC
  Administered 2014-09-09 – 2014-09-13 (×5): 75 ug via ORAL
  Filled 2014-09-08 (×5): qty 1

## 2014-09-08 MED ORDER — SODIUM CHLORIDE 0.9 % IV BOLUS (SEPSIS)
1000.0000 mL | Freq: Once | INTRAVENOUS | Status: AC
  Administered 2014-09-08: 1000 mL via INTRAVENOUS

## 2014-09-08 MED ORDER — QUETIAPINE FUMARATE 300 MG PO TABS
300.0000 mg | ORAL_TABLET | Freq: Every day | ORAL | Status: DC
  Administered 2014-09-08 – 2014-09-12 (×5): 300 mg via ORAL
  Filled 2014-09-08: qty 1
  Filled 2014-09-08: qty 3
  Filled 2014-09-08 (×2): qty 1
  Filled 2014-09-08: qty 3
  Filled 2014-09-08: qty 1

## 2014-09-08 MED ORDER — CEFAZOLIN SODIUM 1-5 GM-% IV SOLN
1.0000 g | Freq: Once | INTRAVENOUS | Status: DC
  Administered 2014-09-08: 1 g via INTRAVENOUS
  Filled 2014-09-08: qty 50

## 2014-09-08 MED ORDER — HYDROMORPHONE HCL 2 MG/ML IJ SOLN
1.5000 mg | INTRAMUSCULAR | Status: DC | PRN
  Administered 2014-09-08 – 2014-09-09 (×6): 1.5 mg via INTRAVENOUS
  Filled 2014-09-08 (×6): qty 1

## 2014-09-08 MED ORDER — ONDANSETRON HCL 4 MG/2ML IJ SOLN
4.0000 mg | Freq: Four times a day (QID) | INTRAMUSCULAR | Status: DC | PRN
  Administered 2014-09-08 – 2014-09-13 (×5): 4 mg via INTRAVENOUS
  Filled 2014-09-08 (×6): qty 2

## 2014-09-08 MED ORDER — CLONAZEPAM 1 MG PO TABS
2.0000 mg | ORAL_TABLET | Freq: Two times a day (BID) | ORAL | Status: DC
  Administered 2014-09-09 (×2): 2 mg via ORAL
  Filled 2014-09-08 (×2): qty 2

## 2014-09-08 MED ORDER — CETYLPYRIDINIUM CHLORIDE 0.05 % MT LIQD
7.0000 mL | Freq: Two times a day (BID) | OROMUCOSAL | Status: DC
  Administered 2014-09-10 – 2014-09-11 (×4): 7 mL via OROMUCOSAL

## 2014-09-08 MED ORDER — SODIUM CHLORIDE 0.9 % IV SOLN
INTRAVENOUS | Status: AC
  Administered 2014-09-08: 18:00:00 via INTRAVENOUS

## 2014-09-08 MED ORDER — DEXTROSE 5 % IV SOLN
1.0000 g | INTRAVENOUS | Status: DC
  Administered 2014-09-08: 1 g via INTRAVENOUS
  Filled 2014-09-08: qty 10

## 2014-09-08 MED ORDER — ENOXAPARIN SODIUM 40 MG/0.4ML ~~LOC~~ SOLN
40.0000 mg | SUBCUTANEOUS | Status: DC
  Administered 2014-09-08 – 2014-09-12 (×5): 40 mg via SUBCUTANEOUS
  Filled 2014-09-08 (×6): qty 0.4

## 2014-09-08 MED ORDER — BOOST / RESOURCE BREEZE PO LIQD
1.0000 | Freq: Three times a day (TID) | ORAL | Status: DC
  Administered 2014-09-09 – 2014-09-12 (×9): 1 via ORAL

## 2014-09-08 MED ORDER — FENTANYL CITRATE (PF) 100 MCG/2ML IJ SOLN
50.0000 ug | Freq: Once | INTRAMUSCULAR | Status: AC
  Administered 2014-09-08: 50 ug via INTRAVENOUS
  Filled 2014-09-08: qty 2

## 2014-09-08 MED ORDER — MEGESTROL ACETATE 40 MG/ML PO SUSP
200.0000 mg | Freq: Every day | ORAL | Status: DC
  Administered 2014-09-10 – 2014-09-13 (×4): 200 mg via ORAL
  Filled 2014-09-08 (×5): qty 5

## 2014-09-08 MED ORDER — SODIUM CHLORIDE 0.9 % IJ SOLN
3.0000 mL | Freq: Two times a day (BID) | INTRAMUSCULAR | Status: DC
  Administered 2014-09-08 – 2014-09-13 (×8): 3 mL via INTRAVENOUS

## 2014-09-08 MED ORDER — CARBAMAZEPINE ER 200 MG PO CP12
200.0000 mg | ORAL_CAPSULE | Freq: Two times a day (BID) | ORAL | Status: DC
  Filled 2014-09-08: qty 1

## 2014-09-08 MED ORDER — SODIUM CHLORIDE 0.9 % IV SOLN
INTRAVENOUS | Status: DC
  Administered 2014-09-09 (×2): via INTRAVENOUS

## 2014-09-08 MED ORDER — ONDANSETRON HCL 4 MG/2ML IJ SOLN
4.0000 mg | Freq: Once | INTRAMUSCULAR | Status: AC
  Administered 2014-09-08: 4 mg via INTRAVENOUS
  Filled 2014-09-08: qty 2

## 2014-09-08 MED ORDER — GABAPENTIN 300 MG PO CAPS
300.0000 mg | ORAL_CAPSULE | Freq: Three times a day (TID) | ORAL | Status: DC
  Administered 2014-09-08 – 2014-09-13 (×12): 300 mg via ORAL
  Filled 2014-09-08 (×16): qty 1

## 2014-09-08 MED ORDER — LITHIUM CARBONATE ER 450 MG PO TBCR
450.0000 mg | EXTENDED_RELEASE_TABLET | Freq: Every day | ORAL | Status: DC
  Administered 2014-09-08: 450 mg via ORAL
  Filled 2014-09-08: qty 1

## 2014-09-08 MED ORDER — CHLORHEXIDINE GLUCONATE 0.12 % MT SOLN
15.0000 mL | Freq: Two times a day (BID) | OROMUCOSAL | Status: DC
  Administered 2014-09-09 – 2014-09-12 (×6): 15 mL via OROMUCOSAL
  Filled 2014-09-08 (×11): qty 15

## 2014-09-08 NOTE — ED Notes (Signed)
Pt can go at 17:35

## 2014-09-08 NOTE — ED Notes (Signed)
Patient refused 2nd set of blood cultures. RN made aware.

## 2014-09-08 NOTE — H&P (Signed)
History and Physical    Victoria Flores EHU:[id number] DOB: 1965-02-15 DOA: 09/08/2014  Referring physician: Dr. Darl Householder PCP: Philis Fendt, MD  Specialists: none  Chief Complaint: flank pain, nausea/vomiting  HPI: Victoria Flores is a 49 y.o. female has a past medical history significant for Anxiety, depression, hypothyroidism, presents to the ED with 2 days of worsening bilateral flank pain more on right than left, lower abdominal pain, nausea and vomiting. Patient has been trying to take her home Percocet for the pain and it wasn't helping. She also had a fever of 100.6 at home and then decided to come to the ED. She endorses a history of kidney stones, she endorses chills at home. She has a history of chronic pain and is on chronic Percocet. She endorses intermittent chest pains and also dyspnea as she feels like she can't take a deep breath due to back/flank pain. She endorses a chronic history of multiple joint pains. She endorses lightheadedness. She denies weight gain/loss recently. Denies diarrhea, blood in her stool or urine.   In the ED, patient is afebrile, but tachycardic, her BP is normal, she has UA with significant pyuria. She has a leukocytosis of 20K. She underwent a CT abdomen and pelvis stone protocol which showed left sided possible pyelonephritis. [facility] asked for admission for sepsis / pyelonephritis.   Review of Systems: as per HPI otherwise 10 point ROS negative  Past Medical History  Diagnosis Date  . Arthritis   . Prolapsed disk   . Heart murmur   . Sleep deprivation   . Depression   . Sickle cell trait   . Hypothyroid   . Anxiety   . Renal disorder    Past Surgical History  Procedure Laterality Date  . Cesarean section     Social History:  reports that she has been smoking.  She does not have any smokeless tobacco history on file. She reports that she does not drink alcohol or use illicit drugs.  Allergies  Allergen Reactions  . Garlic Diarrhea   Vomiting    Family History  Problem Relation Age of Onset  . Breast cancer Sister   . Diabetes Brother     Prior to Admission medications   Medication Sig Start Date End Date Taking? Authorizing Provider  carbamazepine (CARBATROL) 200 MG 12 hr capsule Take 200 mg by mouth 2 (two) times daily.   Yes Historical Provider, MD  clonazePAM (KLONOPIN) 2 MG tablet Take 2 mg by mouth 2 (two) times daily.   Yes Historical Provider, MD  cloNIDine (CATAPRES) 0.2 MG tablet Take 1 tablet (0.2 mg total) by mouth 2 (two) times daily. 04/24/14  Yes Ernestina Patches, MD  FLUoxetine (PROZAC) 20 MG capsule Take 40 mg by mouth daily.   Yes Historical Provider, MD  gabapentin (NEURONTIN) 300 MG capsule Take 300 mg by mouth 3 (three) times daily.   Yes Historical Provider, MD  levothyroxine (SYNTHROID, LEVOTHROID) 75 MCG tablet Take 1 tablet (75 mcg total) by mouth daily. 12/19/11  Yes Janece Canterbury, MD  lithium carbonate (ESKALITH) 450 MG CR tablet Take 450 mg by mouth at bedtime. 09/01/14  Yes Historical Provider, MD  megestrol (MEGACE) 40 MG/ML suspension Take 200 mg by mouth daily.   Yes Historical Provider, MD  mirtazapine (REMERON) 30 MG tablet Take 30 mg by mouth at bedtime.   Yes Historical Provider, MD  QUEtiapine (SEROQUEL) 300 MG tablet Take 300 mg by mouth 2 (two) times daily. 09/04/14  Yes Historical Provider, MD  traMADol (ULTRAM) 50 MG tablet Take 50 mg by mouth every 6 (six) hours as needed for moderate pain or severe pain.   Yes Historical Provider, MD  loperamide (IMODIUM) 2 MG capsule Take 1 capsule (2 mg total) by mouth 4 (four) times daily as needed for diarrhea or loose stools. Patient not taking: Reported on 09/08/2014 04/24/14   Ernestina Patches, MD  methocarbamol (ROBAXIN) 500 MG tablet Take 1 tablet (500 mg total) by mouth 2 (two) times daily. Patient not taking: Reported on 09/08/2014 04/24/14   Ernestina Patches, MD  ondansetron (ZOFRAN) 4 MG tablet Take 1 tablet (4 mg total) by mouth every 6 (six)  hours. Patient not taking: Reported on 09/08/2014 04/24/14   Ernestina Patches, MD  risperidone (RISPERDAL) 4 MG tablet Take 1 tablet (4 mg total) by mouth daily. Patient not taking: Reported on 04/24/2014 12/19/11   Janece Canterbury, MD   Physical Exam: Filed Vitals:   09/08/14 1329 09/08/14 1532  BP: 139/73 132/73  Pulse: 99 102  Temp: 98.3 F (36.8 C)   TempSrc: Oral   Resp: 18 18  Weight: 57.607 kg (127 lb)   SpO2: 100% 100%     GENERAL: appears in pain  HEENT: Head is normocephalic and atraumatic. Extraocular muscles are intact. Pupils are equal, round, and reactive to light and accommodation.   NECK: Supple.   LUNGS: Clear to auscultation. No wheezing or crackles  HEART: Regular rate and rhythm without murmur. 2+ pulses, no JVD, no peripheral edema. Tachycardic  ABDOMEN: Soft, mild tenderness lower abdomen, and nondistended. Positive bowel sounds.  EXTREMITIES: Without any cyanosis, clubbing, rash, lesions or edema.  NEUROLOGIC: she is alert and oriented x3. Cranial nerves II through XII are grossly intact. Strength 5/5 in all 4.  PSYCHIATRIC: Normal mood and affect  SKIN: No ulceration or induration present.   Labs on Admission:  Basic Metabolic Panel:  Recent Labs Lab 09/08/14 1459  NA 139  K 3.6  CL 105  CO2 22  GLUCOSE 132*  BUN 10  CREATININE 1.03*  CALCIUM 9.0   CBC:  Recent Labs Lab 09/08/14 1459  WBC 20.8*  NEUTROABS 18.4*  HGB 12.1  HCT 37.0  MCV 66.8*  PLT 182    Radiological Exams on Admission: Ct Renal Stone Study  09/08/2014   CLINICAL DATA:  RIGHT flank pain, nausea, vomiting, history kidney stones  EXAM: CT ABDOMEN AND PELVIS WITHOUT CONTRAST  TECHNIQUE: Multidetector CT imaging of the abdomen and pelvis was performed following the standard protocol without IV contrast. Sagittal and coronal MPR images reconstructed from axial data set. Oral contrast not administered for this indication.  COMPARISON:  09/28/2013  FINDINGS: Lung bases  clear.  Nonobstructing RIGHT renal calculi largest 4 mm diameter inferior pole.  Mild LEFT perinephric and peripelvic edema which can be seen with stone disease and infection.  Minimal prominence of the LEFT renal collecting system and proximal LEFT ureter with decompressed distal ureter.  No discrete point of obstruction or ureteral calcification identified.  Liver, spleen, pancreas, and adrenal glands unremarkable.  Normal appearing gallbladder and appendix by CT.  Bladder, uterus and adnexa normal appearance.  Stomach and bowel loops grossly normal for technique.  No mass, adenopathy, free air or free fluid.  Osseous structures unremarkable.  IMPRESSION: Nonobstructing small RIGHT renal calculi.  LEFT perinephric and peripelvic edema with minimal prominence of LEFT collecting system and proximal LEFT ureter though no discrete ureteral calculus is visualized.  This could represent a passed stone or pyelonephritis and  correlation urinalysis is recommended.  These findings are on the opposite side from the RIGHT flank symptoms provided as history for this exam, recommend clinical correlation.   Electronically Signed   By: Lavonia Dana M.D.   On: 09/08/2014 14:52   EKG: Independently reviewed. Sinus tachycardia  Assessment/Plan Principal Problem:   Pyelonephritis Active Problems:   Nausea and vomiting in adult   Depression   Hypothyroidism   GAD (generalized anxiety disorder)   Sepsis due to pyelonephritis - with tachycardia, fever, elevated WBC and positive UA - no history of drug resistant organisms, start empiric ceftriaxone, blood and urine cultures obtained - tachy to 120s in the ED, monitor on telemetry initially - IVF - pain control  Nephrolithiasis - CT with stones on left, do not appear obstructive  Nausea/vomiting - likely in the setting of #1, supportive treatment - clear liquid diet, advance as tolerated  Hypothyroidism - will check a TSH  - continue  synthroid  GAD/Depression - patient is currently taking Lithium (will check a Li level), Seroquel only 300 mg QHS and not BID, klonopin BID, Carbamazepine.  - she denies taking clonidine (states is "as needed" for abdominal pain). Denies history of HTN.  Will ask pharmacy to reconcile again since there are discrepancies.   Diet: clear liquid Fluids: NS DVT Prophylaxis: Lovenox  Code Status: Full  Family Communication: d/w extended family bedside  Disposition Plan: admit to tele   Costin M. Cruzita Lederer, MD [facility] Pager [phone]  If 7PM-7AM, please contact night-coverage [email or web address] Password [email or web address] 09/08/2014, 5:22 PM

## 2014-09-08 NOTE — Discharge Instructions (Signed)
Pyelonephritis, Adult °Pyelonephritis is a kidney infection. In general, there are 2 main types of pyelonephritis: °· Infections that come on quickly without any warning (acute pyelonephritis). °· Infections that persist for a long period of time (chronic pyelonephritis). °CAUSES  °Two main causes of pyelonephritis are: °· Bacteria traveling from the bladder to the kidney. This is a problem especially in pregnant women. The urine in the bladder can become filled with bacteria from multiple causes, including: °¨ Inflammation of the prostate gland (prostatitis). °¨ Sexual intercourse in females. °¨ Bladder infection (cystitis). °· Bacteria traveling from the bloodstream to the tissue part of the kidney. °Problems that may increase your risk of getting a kidney infection include: °· Diabetes. °· Kidney stones or bladder stones. °· Cancer. °· Catheters placed in the bladder. °· Other abnormalities of the kidney or ureter. °SYMPTOMS  °· Abdominal pain. °· Pain in the side or flank area. °· Fever. °· Chills. °· Upset stomach. °· Blood in the urine (dark urine). °· Frequent urination. °· Strong or persistent urge to urinate. °· Burning or stinging when urinating. °DIAGNOSIS  °Your caregiver may diagnose your kidney infection based on your symptoms. A urine sample may also be taken. °TREATMENT  °In general, treatment depends on how severe the infection is.  °· If the infection is mild and caught early, your caregiver may treat you with oral antibiotics and send you home. °· If the infection is more severe, the bacteria may have gotten into the bloodstream. This will require intravenous (IV) antibiotics and a hospital stay. Symptoms may include: °¨ High fever. °¨ Severe flank pain. °¨ Shaking chills. °· Even after a hospital stay, your caregiver may require you to be on oral antibiotics for a period of time. °· Other treatments may be required depending upon the cause of the infection. °HOME CARE INSTRUCTIONS  °· Take your  antibiotics as directed. Finish them even if you start to feel better. °· Make an appointment to have your urine checked to make sure the infection is gone. °· Drink enough fluids to keep your urine clear or pale yellow. °· Take medicines for the bladder if you have urgency and frequency of urination as directed by your caregiver. °SEEK IMMEDIATE MEDICAL CARE IF:  °· You have a fever or persistent symptoms for more than 2-3 days. °· You have a fever and your symptoms suddenly get worse. °· You are unable to take your antibiotics or fluids. °· You develop shaking chills. °· You experience extreme weakness or fainting. °· There is no improvement after 2 days of treatment. °MAKE SURE YOU: °· Understand these instructions. °· Will watch your condition. °· Will get help right away if you are not doing well or get worse. °Document Released: 01/13/2005 Document Revised: 07/15/2011 Document Reviewed: 06/19/2010 °[employer]® Patient Information ©2015 [employer]. This information is not intended to replace advice given to you by your health care provider. Make sure you discuss any questions you have with your health care provider. ° °

## 2014-09-08 NOTE — ED Notes (Signed)
Patient unable to provide a urine sample at this time

## 2014-09-08 NOTE — ED Notes (Signed)
Pt reports right flank pain and n/v; hx of kidney stones.

## 2014-09-08 NOTE — ED Provider Notes (Signed)
CSN: [id number]     Arrival date & time 09/08/14  1300 History   First MD Initiated Contact with Patient 09/08/14 1407     Chief Complaint  Patient presents with  . Flank Pain     (Consider location/radiation/quality/duration/timing/severity/associated sxs/prior Treatment) HPI Comments: Bilateral flank pain with lower abdominal pain, nausea, and vomiting since yesterday.  Hx kidney stones and infections in the past.  Fever to 100.6 at home.  States uncontrolled nausea and vomiting today and unable to keep anything down.  No vaginal bleeding or discharge.  Endorses dysuria and urgency. Pain is not controlled by chronic percocet. No chest pain.  Some SOB.  No hypoxia. Denies any history of cancer or IV drug use.  The history is provided by the patient. The history is limited by the condition of the patient.    Past Medical History  Diagnosis Date  . Arthritis   . Prolapsed disk   . Heart murmur   . Sleep deprivation   . Depression   . Sickle cell trait   . Hypothyroid   . Anxiety   . Renal disorder    Past Surgical History  Procedure Laterality Date  . Cesarean section     Family History  Problem Relation Age of Onset  . Breast cancer Sister   . Diabetes Brother    Social History  Substance Use Topics  . Smoking status: Current Some Day Smoker -- 0.50 packs/day for 21 years  . Smokeless tobacco: None  . Alcohol Use: No   OB History    No data available     Review of Systems  Constitutional: Positive for fever, activity change and appetite change.  Respiratory: Negative for cough and shortness of breath.   Cardiovascular: Negative for chest pain.  Gastrointestinal: Positive for nausea, vomiting and abdominal pain.  Genitourinary: Positive for dysuria, hematuria and flank pain. Negative for vaginal bleeding and vaginal discharge.  Musculoskeletal: Positive for back pain. Negative for neck pain.  Skin: Negative for wound.  Neurological: Positive for weakness.  A  complete 10 system review of systems was obtained and all systems are negative except as noted in the HPI and PMH.      Allergies  Garlic  Home Medications   Prior to Admission medications   Medication Sig Start Date End Date Taking? Authorizing Provider  carbamazepine (CARBATROL) 200 MG 12 hr capsule Take 200 mg by mouth 2 (two) times daily.   Yes Historical Provider, MD  clonazePAM (KLONOPIN) 2 MG tablet Take 2 mg by mouth 2 (two) times daily.   Yes Historical Provider, MD  cloNIDine (CATAPRES) 0.2 MG tablet Take 1 tablet (0.2 mg total) by mouth 2 (two) times daily. 04/24/14  Yes Ernestina Patches, MD  FLUoxetine (PROZAC) 20 MG capsule Take 40 mg by mouth daily.   Yes Historical Provider, MD  gabapentin (NEURONTIN) 300 MG capsule Take 300 mg by mouth 3 (three) times daily.   Yes Historical Provider, MD  levothyroxine (SYNTHROID, LEVOTHROID) 75 MCG tablet Take 1 tablet (75 mcg total) by mouth daily. 12/19/11  Yes Janece Canterbury, MD  lithium carbonate (ESKALITH) 450 MG CR tablet Take 450 mg by mouth at bedtime. 09/01/14  Yes Historical Provider, MD  megestrol (MEGACE) 40 MG/ML suspension Take 200 mg by mouth daily.   Yes Historical Provider, MD  mirtazapine (REMERON) 30 MG tablet Take 30 mg by mouth at bedtime.   Yes Historical Provider, MD  QUEtiapine (SEROQUEL) 300 MG tablet Take 300 mg by mouth 2 (  two) times daily. 09/04/14  Yes Historical Provider, MD  traMADol (ULTRAM) 50 MG tablet Take 50 mg by mouth every 6 (six) hours as needed for moderate pain or severe pain.   Yes Historical Provider, MD  loperamide (IMODIUM) 2 MG capsule Take 1 capsule (2 mg total) by mouth 4 (four) times daily as needed for diarrhea or loose stools. Patient not taking: Reported on 09/08/2014 04/24/14   Ernestina Patches, MD  methocarbamol (ROBAXIN) 500 MG tablet Take 1 tablet (500 mg total) by mouth 2 (two) times daily. Patient not taking: Reported on 09/08/2014 04/24/14   Ernestina Patches, MD  ondansetron (ZOFRAN) 4 MG  tablet Take 1 tablet (4 mg total) by mouth every 6 (six) hours. Patient not taking: Reported on 09/08/2014 04/24/14   Ernestina Patches, MD  risperidone (RISPERDAL) 4 MG tablet Take 1 tablet (4 mg total) by mouth daily. Patient not taking: Reported on 04/24/2014 12/19/11   Janece Canterbury, MD   BP 132/73 mmHg  Pulse 102  Temp(Src) 98.3 F (36.8 C) (Oral)  Resp 18  Wt 127 lb (57.607 kg)  SpO2 100%  LMP 08/15/2014 Physical Exam  Constitutional: She is oriented to person, place, and time. She appears well-developed and well-nourished. She appears distressed.  Uncomfortable, writhing around bed  HENT:  Head: Normocephalic and atraumatic.  Mouth/Throat: Oropharynx is clear and moist. No oropharyngeal exudate.  Eyes: Conjunctivae and EOM are normal. Pupils are equal, round, and reactive to light.  Neck: Normal range of motion. Neck supple.  No meningismus.  Cardiovascular: Normal rate, normal heart sounds and intact distal pulses.   No murmur heard. tachycardic  Pulmonary/Chest: Effort normal and breath sounds normal. No respiratory distress. She exhibits no tenderness.  Abdominal: Soft. There is no tenderness. There is no rebound and no guarding.  Musculoskeletal: Normal range of motion. She exhibits tenderness. She exhibits no edema.  Paraspinal lumbar tenderness. No midline   Neurological: She is alert and oriented to person, place, and time. No cranial nerve deficit. She exhibits normal muscle tone. Coordination normal.  No ataxia on finger to nose bilaterally. No pronator drift. 5/5 strength throughout. CN 2-12 intact. Equal grip strength. Sensation intact.   Skin: Skin is warm.  Psychiatric: She has a normal mood and affect. Her behavior is normal.  Nursing note and vitals reviewed.   ED Course  Procedures (including critical care time) Labs Review Labs Reviewed  URINALYSIS, ROUTINE W REFLEX MICROSCOPIC (NOT AT [facility]) - Abnormal; Notable for the following:    APPearance TURBID (*)     Hgb urine dipstick MODERATE (*)    Protein, ur 100 (*)    Nitrite POSITIVE (*)    Leukocytes, UA LARGE (*)    All other components within normal limits  CBC WITH DIFFERENTIAL/PLATELET - Abnormal; Notable for the following:    WBC 20.8 (*)    RBC 5.54 (*)    MCV 66.8 (*)    MCH 21.8 (*)    Neutrophils Relative % 88 (*)    Lymphocytes Relative 6 (*)    Neutro Abs 18.4 (*)    Monocytes Absolute 1.2 (*)    All other components within normal limits  BASIC METABOLIC PANEL - Abnormal; Notable for the following:    Glucose, Bld 132 (*)    Creatinine, Ser 1.03 (*)    All other components within normal limits  URINE MICROSCOPIC-ADD ON - Abnormal; Notable for the following:    Squamous Epithelial / LPF FEW (*)    Bacteria, UA MANY (*)  All other components within normal limits  I-STAT CG4 LACTIC ACID, ED - Abnormal; Notable for the following:    Lactic Acid, Venous 3.11 (*)    All other components within normal limits  CULTURE, BLOOD (ROUTINE X 2)  CULTURE, BLOOD (ROUTINE X 2)  URINE CULTURE  BASIC METABOLIC PANEL  CBC  LITHIUM LEVEL  HIV ANTIBODY (ROUTINE TESTING)  TSH  I-STAT BETA HCG BLOOD, ED ([facility], [facility], [facility] ONLY)  I-STAT CG4 LACTIC ACID, ED    Imaging Review Ct Renal Stone Study  09/08/2014   CLINICAL DATA:  RIGHT flank pain, nausea, vomiting, history kidney stones  EXAM: CT ABDOMEN AND PELVIS WITHOUT CONTRAST  TECHNIQUE: Multidetector CT imaging of the abdomen and pelvis was performed following the standard protocol without IV contrast. Sagittal and coronal MPR images reconstructed from axial data set. Oral contrast not administered for this indication.  COMPARISON:  09/28/2013  FINDINGS: Lung bases clear.  Nonobstructing RIGHT renal calculi largest 4 mm diameter inferior pole.  Mild LEFT perinephric and peripelvic edema which can be seen with stone disease and infection.  Minimal prominence of the LEFT renal collecting system and proximal LEFT ureter with decompressed distal  ureter.  No discrete point of obstruction or ureteral calcification identified.  Liver, spleen, pancreas, and adrenal glands unremarkable.  Normal appearing gallbladder and appendix by CT.  Bladder, uterus and adnexa normal appearance.  Stomach and bowel loops grossly normal for technique.  No mass, adenopathy, free air or free fluid.  Osseous structures unremarkable.  IMPRESSION: Nonobstructing small RIGHT renal calculi.  LEFT perinephric and peripelvic edema with minimal prominence of LEFT collecting system and proximal LEFT ureter though no discrete ureteral calculus is visualized.  This could represent a passed stone or pyelonephritis and correlation urinalysis is recommended.  These findings are on the opposite side from the RIGHT flank symptoms provided as history for this exam, recommend clinical correlation.   Electronically Signed   By: Lavonia Dana M.D.   On: 09/08/2014 14:52   I, Wyvonnia Dusky, Velera Lansdale, personally reviewed and evaluated these images and lab results as part of my medical decision-making.   EKG Interpretation   Date/Time:  Friday September 08 2014 16:33:31 EDT Ventricular Rate:  139 PR Interval:  147 QRS Duration: 85 QT Interval:  270 QTC Calculation: 410 R Axis:   50 Text Interpretation:  Sinus tachycardia Multiform ventricular premature  complexes Aberrant conduction of SV complex(es) Consider right atrial  enlargement Low voltage, precordial leads Probable anteroseptal infarct,  old Nonspecific repol abnormality, inferior leads tachycardia new since  previous Confirmed by YAO  MD, DAVID ([id number]) on 09/08/2014 4:40:22 PM      MDM   Final diagnoses:  Flank pain  Pyelonephritis   Flank pain with urinary symptoms  Tachycardic and febrile.  Meets sepsis criteria.  BP stable.  Lactate elevated.  IVF, labs, cultures, IV rocephin.  CT shows no ureteral stones but does showing stranding on L side with edema.  PErsistent tachycardia, no vomiting in the ED.  Will need  admission for sepsis and pyelonephritis.   Ezequiel Essex, MD 09/08/14 [phone]

## 2014-09-09 ENCOUNTER — Inpatient Hospital Stay (HOSPITAL_COMMUNITY): Payer: Medicare Other

## 2014-09-09 DIAGNOSIS — A419 Sepsis, unspecified organism: Secondary | ICD-10-CM

## 2014-09-09 DIAGNOSIS — R7881 Bacteremia: Secondary | ICD-10-CM | POA: Insufficient documentation

## 2014-09-09 LAB — BLOOD GAS, ARTERIAL
Acid-base deficit: 3.2 mmol/L — ABNORMAL HIGH (ref 0.0–2.0)
BICARBONATE: 19.2 meq/L — AB (ref 20.0–24.0)
DRAWN BY: 331471
O2 Saturation: 89.4 %
PO2 ART: 67.1 mmHg — AB (ref 80.0–100.0)
Patient temperature: 102.9
TCO2: 17.4 mmol/L (ref 0–100)
pCO2 arterial: 30.7 mmHg — ABNORMAL LOW (ref 35.0–45.0)
pH, Arterial: 7.425 (ref 7.350–7.450)

## 2014-09-09 LAB — BASIC METABOLIC PANEL
Anion gap: 6 (ref 5–15)
BUN: 9 mg/dL (ref 6–20)
CALCIUM: 7.8 mg/dL — AB (ref 8.9–10.3)
CO2: 23 mmol/L (ref 22–32)
Chloride: 112 mmol/L — ABNORMAL HIGH (ref 101–111)
Creatinine, Ser: 0.98 mg/dL (ref 0.44–1.00)
GFR calc Af Amer: >60 mL/min (ref >60)
Glucose, Bld: 103 mg/dL — ABNORMAL HIGH (ref 65–99)
POTASSIUM: 3.9 mmol/L (ref 3.5–5.1)
Sodium: 141 mmol/L (ref 135–145)

## 2014-09-09 LAB — PROTIME-INR
INR: 1.42 (ref 0.00–1.49)
Prothrombin Time: 17.5 seconds — ABNORMAL HIGH (ref 11.6–15.2)

## 2014-09-09 LAB — CBC
HEMATOCRIT: 30.3 % — AB (ref 36.0–46.0)
Hemoglobin: 9.6 g/dL — ABNORMAL LOW (ref 12.0–15.0)
MCH: 21.1 pg — AB (ref 26.0–34.0)
MCHC: 31.7 g/dL (ref 30.0–36.0)
MCV: 66.7 fL — ABNORMAL LOW (ref 78.0–100.0)
Platelets: 140 10*3/uL — ABNORMAL LOW (ref 150–400)
RBC: 4.54 MIL/uL (ref 3.87–5.11)
RDW: 14.4 % (ref 11.5–15.5)
WBC: 18.5 10*3/uL — ABNORMAL HIGH (ref 4.0–10.5)

## 2014-09-09 LAB — HIV ANTIBODY (ROUTINE TESTING W REFLEX): HIV SCREEN 4TH GENERATION: NONREACTIVE

## 2014-09-09 LAB — LACTIC ACID, PLASMA
Lactic Acid, Venous: 1.1 mmol/L (ref 0.5–2.0)
Lactic Acid, Venous: 0.9 mmol/L (ref 0.5–2.0)

## 2014-09-09 LAB — PROCALCITONIN: PROCALCITONIN: 18.22 ng/mL

## 2014-09-09 LAB — APTT: aPTT: 40 seconds — ABNORMAL HIGH (ref 24–37)

## 2014-09-09 LAB — MRSA PCR SCREENING: MRSA by PCR: NEGATIVE

## 2014-09-09 MED ORDER — ACETAMINOPHEN 325 MG PO TABS
650.0000 mg | ORAL_TABLET | ORAL | Status: DC | PRN
  Administered 2014-09-09 – 2014-09-10 (×2): 650 mg via ORAL
  Filled 2014-09-09 (×2): qty 2

## 2014-09-09 MED ORDER — SODIUM CHLORIDE 0.9 % IV SOLN
500.0000 mg | INTRAVENOUS | Status: AC
  Administered 2014-09-09: 500 mg via INTRAVENOUS
  Filled 2014-09-09: qty 500

## 2014-09-09 MED ORDER — DEXTROSE 5 % IV SOLN
2.0000 g | Freq: Three times a day (TID) | INTRAVENOUS | Status: DC
  Administered 2014-09-09 (×2): 2 g via INTRAVENOUS
  Filled 2014-09-09 (×3): qty 2

## 2014-09-09 MED ORDER — SODIUM CHLORIDE 0.9 % IV SOLN
500.0000 mg | Freq: Three times a day (TID) | INTRAVENOUS | Status: DC
  Administered 2014-09-09 – 2014-09-10 (×2): 500 mg via INTRAVENOUS
  Filled 2014-09-09 (×3): qty 500

## 2014-09-09 MED ORDER — HYDROMORPHONE HCL 1 MG/ML IJ SOLN
1.0000 mg | INTRAMUSCULAR | Status: DC | PRN
  Administered 2014-09-09 – 2014-09-13 (×19): 1 mg via INTRAVENOUS
  Filled 2014-09-09 (×22): qty 1

## 2014-09-09 MED ORDER — ACETAMINOPHEN 325 MG PO TABS
650.0000 mg | ORAL_TABLET | ORAL | Status: AC
  Administered 2014-09-09: 650 mg via ORAL
  Filled 2014-09-09: qty 2

## 2014-09-09 MED ORDER — ACETAMINOPHEN 325 MG PO TABS
650.0000 mg | ORAL_TABLET | Freq: Four times a day (QID) | ORAL | Status: DC | PRN
  Administered 2014-09-09: 650 mg via ORAL
  Filled 2014-09-09 (×2): qty 2

## 2014-09-09 MED ORDER — IOHEXOL 300 MG/ML  SOLN
100.0000 mL | Freq: Once | INTRAMUSCULAR | Status: AC | PRN
  Administered 2014-09-09: 100 mL via INTRAVENOUS

## 2014-09-09 NOTE — Consult Note (Addendum)
Name: Victoria Flores MRN: [id number] DOB: 04-29-1965    ADMISSION DATE:  09/08/2014 CONSULTATION DATE:  09/09/14   REFERRING MD :  Dr Cruzita Lederer  CHIEF COMPLAINT:  Shortness of breath  BRIEF PATIENT DESCRIPTION: 49yo AAF admitted for sepsis of urinary source.  SIGNIFICANT EVENTS: CXR w/bilateral symmetric interstitial opacities  STUDIES:  CXR as noted above  HISTORY OF PRESENT ILLNESS:  49yo AAF w/PMHx of anxiety, depression, who presented to the ED w/flank pain and was diagnosed and then admitted on 8/12 with sepsis 2/2 pyelonephritis. She is on primaxin. She takes numerous anxiety medications at home as listed below but including prozac, rememron, risperdal, lithium, and seroquel.   PAST MEDICAL HISTORY :   has a past medical history of Arthritis; Prolapsed disk; Heart murmur; Sleep deprivation; Depression; Sickle cell trait; Hypothyroid; Anxiety; and Renal disorder.  has past surgical history that includes Cesarean section. Prior to Admission medications   Medication Sig Start Date End Date Taking? Authorizing Provider  clonazePAM (KLONOPIN) 2 MG tablet Take 2 mg by mouth 2 (two) times daily.   Yes Historical Provider, MD  cloNIDine (CATAPRES) 0.2 MG tablet Take 1 tablet (0.2 mg total) by mouth 2 (two) times daily. 04/24/14  Yes Ernestina Patches, MD  FLUoxetine (PROZAC) 20 MG capsule Take 40 mg by mouth daily.   Yes Historical Provider, MD  gabapentin (NEURONTIN) 300 MG capsule Take 300 mg by mouth 3 (three) times daily.   Yes Historical Provider, MD  levothyroxine (SYNTHROID, LEVOTHROID) 75 MCG tablet Take 1 tablet (75 mcg total) by mouth daily. 12/19/11  Yes Janece Canterbury, MD  lithium carbonate (ESKALITH) 450 MG CR tablet Take 450 mg by mouth at bedtime. 09/01/14  Yes Historical Provider, MD  megestrol (MEGACE) 40 MG/ML suspension Take 200 mg by mouth daily.   Yes Historical Provider, MD  mirtazapine (REMERON) 30 MG tablet Take 30 mg by mouth at bedtime.   Yes Historical Provider, MD   QUEtiapine (SEROQUEL) 300 MG tablet Take 300 mg by mouth at bedtime.  09/04/14  Yes Historical Provider, MD  traMADol (ULTRAM) 50 MG tablet Take 50 mg by mouth every 6 (six) hours as needed for moderate pain or severe pain.   Yes Historical Provider, MD  loperamide (IMODIUM) 2 MG capsule Take 1 capsule (2 mg total) by mouth 4 (four) times daily as needed for diarrhea or loose stools. Patient not taking: Reported on 09/08/2014 04/24/14   Ernestina Patches, MD  methocarbamol (ROBAXIN) 500 MG tablet Take 1 tablet (500 mg total) by mouth 2 (two) times daily. Patient not taking: Reported on 09/08/2014 04/24/14   Ernestina Patches, MD  ondansetron (ZOFRAN) 4 MG tablet Take 1 tablet (4 mg total) by mouth every 6 (six) hours. Patient not taking: Reported on 09/08/2014 04/24/14   Ernestina Patches, MD  risperidone (RISPERDAL) 4 MG tablet Take 1 tablet (4 mg total) by mouth daily. Patient not taking: Reported on 04/24/2014 12/19/11   Janece Canterbury, MD   Allergies  Allergen Reactions  . Garlic Diarrhea    Vomiting    FAMILY HISTORY:  family history includes Breast cancer in her sister; Diabetes in her brother. SOCIAL HISTORY:  reports that she has been smoking.  She does not have any smokeless tobacco history on file. She reports that she does not drink alcohol or use illicit drugs.  REVIEW OF SYSTEMS:   Constitutional: + for fever, chills, - weight loss, malaise/fatigue and diaphoresis.  HENT: Negative for hearing loss, ear pain, nosebleeds, congestion, sore throat,  neck pain, tinnitus and ear discharge.   Eyes: Negative for blurred vision, double vision, photophobia, pain, discharge and redness.  Respiratory: + for cough,-  hemoptysis, sputum production, + shortness of breath, - wheezing and stridor.   Cardiovascular: Negative for chest pain, palpitations, orthopnea, claudication, leg swelling and PND.  Gastrointestinal: Negative for heartburn, nausea, vomiting, abdominal pain, diarrhea, constipation, blood in  stool and melena.  Genitourinary: Negative for dysuria, urgency, frequency, hematuria and flank pain.  Musculoskeletal: + for myalgias, + back pain, joint pain and falls.  Skin: Negative for itching and rash.  Neurological: Negative for dizziness, tingling, tremors, sensory change, speech change, focal weakness, seizures, loss of consciousness, weakness and + headaches.  Endo/Heme/Allergies: Negative for environmental allergies and polydipsia. Does not bruise/bleed easily.  SUBJECTIVE:   VITAL SIGNS: Temp:  [97.5 F (36.4 C)-103 F (39.4 C)] 98.8 F (37.1 C) (08/13 1820) Pulse Rate:  [115-136] 122 (08/13 1820) Resp:  [14-32] 25 (08/13 1820) BP: (108-153)/(67-97) 136/89 mmHg (08/13 1820) SpO2:  [94 %-100 %] 98 % (08/13 1820) Weight:  [64.2 kg (141 lb 8.6 oz)] 64.2 kg (141 lb 8.6 oz) (08/13 1420)  General:  49 year old african [place] female who appears her stated age,  In no acute distress .she is alert/awake and oriented x 4, well nourished well developed Neuro:  Cranial nerves II-XII grossly intact. PERRL. EOMI. Sensation and strength. Reflexes. Cerebellum. Gait was not assessed due to critical illness.  HEENT: Head, normocephalic, atraumatic. Ears symmetric, permeable. Eyes: no conjunctival icterus, no erythema. Nose: permeable, midline septum Clear oropharynx moist mucosas. Cardiovascular: S1S2 sinus tachycardia, no murmurs, rubs, or gallops auscultated. No thrills palpated.  Lungs:  Chest symmetrical with respirations, bibasilar crackles are present, equal expansion.  Abdomen:  Soft, nontender, no guarding, nondistended. Present bowel sounds. Musculoskeletal:  No muscle atrophy noted nor weakness. ROM intact. Gait was not assessed due to illness. No swelling nor tenderness of the joints. Left costovertebral tenderness Skin:  No notable scars, rashes, cruises. No bed sores noted. Numerous tattoos on her body.  Lymphatics: no palpable lymphadenopathy of cervical, supraclvicular nor  inguinal areas.      Recent Labs Lab 09/08/14 1459 09/09/14 0530  NA 139 141  K 3.6 3.9  CL 105 112*  CO2 22 23  BUN 10 9  CREATININE 1.03* 0.98  GLUCOSE 132* 103*    Recent Labs Lab 09/08/14 1459 09/09/14 0530  HGB 12.1 9.6*  HCT 37.0 30.3*  WBC 20.8* 18.5*  PLT 182 140*   Dg Chest Port 1 View  09/09/2014   CLINICAL DATA:  49 year old female with shortness of breath today. Initial encounter.  EXAM: PORTABLE CHEST - 1 VIEW  COMPARISON:  09/28/2013 and earlier.  FINDINGS: Portable AP semi upright view at 1625 hrs. Lower lung volumes. Stable to mildly increased cardiac silhouette. Other mediastinal contours are within normal limits. Diffusely increased pulmonary interstitial opacity is new. No pneumothorax, pleural effusion or consolidation. Visualized tracheal air column is within normal limits.  IMPRESSION: Lower lung volumes with diffuse interstitial opacity and mildly increased cardiac silhouette.  Main differential considerations are acute pulmonary edema and viral/atypical pneumonia. No pleural effusion identified.   Electronically Signed   By: Genevie Ann M.D.   On: 09/09/2014 16:37   Ct Renal Stone Study  09/08/2014   CLINICAL DATA:  RIGHT flank pain, nausea, vomiting, history kidney stones  EXAM: CT ABDOMEN AND PELVIS WITHOUT CONTRAST  TECHNIQUE: Multidetector CT imaging of the abdomen and pelvis was performed following the standard  protocol without IV contrast. Sagittal and coronal MPR images reconstructed from axial data set. Oral contrast not administered for this indication.  COMPARISON:  09/28/2013  FINDINGS: Lung bases clear.  Nonobstructing RIGHT renal calculi largest 4 mm diameter inferior pole.  Mild LEFT perinephric and peripelvic edema which can be seen with stone disease and infection.  Minimal prominence of the LEFT renal collecting system and proximal LEFT ureter with decompressed distal ureter.  No discrete point of obstruction or ureteral calcification identified.   Liver, spleen, pancreas, and adrenal glands unremarkable.  Normal appearing gallbladder and appendix by CT.  Bladder, uterus and adnexa normal appearance.  Stomach and bowel loops grossly normal for technique.  No mass, adenopathy, free air or free fluid.  Osseous structures unremarkable.  IMPRESSION: Nonobstructing small RIGHT renal calculi.  LEFT perinephric and peripelvic edema with minimal prominence of LEFT collecting system and proximal LEFT ureter though no discrete ureteral calculus is visualized.  This could represent a passed stone or pyelonephritis and correlation urinalysis is recommended.  These findings are on the opposite side from the RIGHT flank symptoms provided as history for this exam, recommend clinical correlation.   Electronically Signed   By: Lavonia Dana M.D.   On: 09/08/2014 14:52    ASSESSMENT / PLAN: 1. Pulmonary edema on CXR, crackles on physical exam in bilateral bases, concern for ARDS.   - pt with O2 saturations of 99% room air on the monitor - pt able to speak in complete sentences without notable shortness of breath.  - would recommend BiPAP if oxygenation substantially deteriorates and diuresis as she is actually hypertensive at this time before intubation.   - discontinued maintenance fluid (NS@100 ) as the patient is sitting up eating soup. She does not appear dry clinically. She is also becoming hyperchloremic.  - pt counseled to take her APAP for her fever instead of hiding it under her sheets. increased the interval of APAP for fever and other types of pain.  - ABG on room air largely unremarkable.  - agree with decreasing narcotics - UOP charted at approx 900cc over the last 2 hours.   2. Sepsis 2/2 pyelonephritis - agree with current management  3. GAD/Depression - meds per primary team.   Care discussed with L. [place]. Thank you for this consult.   Randa Lynn, MD Critical Care Medicine [facility] Pager: [phone]  09/09/2014, 8:07  PM  Addendum minutes of care: [place], MD [place]

## 2014-09-09 NOTE — Progress Notes (Addendum)
PROGRESS NOTE  Victoria Flores KNL:[id number] DOB: Aug 09, 1965 DOA: 09/08/2014 PCP: Philis Fendt, MD   HPI: 49 y.o. female has a past medical history significant for Anxiety, depression, hypothyroidism, presented to the ED with 2 days of worsening bilateral flank pain more on right than left, lower abdominal pain, nausea and vomiting. She was admitted with sepsis of urinary origin.   Subjective / 24 H Interval events - continues to have bilateral flank pain this morning, asleep when I entered her room however in pain when she wakes up   Assessment/Plan: Principal Problem:   Sepsis Active Problems:   Nausea and vomiting in adult   Depression   Hypothyroidism   GAD (generalized anxiety disorder)   Pyelonephritis   Bacteremia    Sepsis with urinary source - Continue Fortaz - with ongoing tachycardia, fever curve improved this morning - leukocytosis with significant elevation still at 18.5  Addendum: 5:00 pm Patient afebrile this morning however late afternoon started to spike fevers again, became more tachycardic and tachypneic. She is not hypotensive nor hypoxic. On re-evaluation she appears more drowsy, endorses ongoing back pain. I have discussed with Dr. Alinda Money from Urology and he reviewed the CT scan done last night, there is no hydronephrosis or any apparent abscesses evident. Will obtain a STAT repeat CT scan with contrast to re-characterize. Will transfer to SDU. CXR with increased bilateral opacities, I am concerned about ARDS, I have contacted PCCM who will evaluate patient. Will order lactic acid, pro-calcitonin, STAT ABG. I changed her antibiotics to Primaxin. Given lethargy, will stop her home Klonazepam and decrease her Dilaudid.  UTI with pyelonephritis - urine cultures negative, pending  Gram negative bacteremia - one set, initially she refused a second set, however it was eventually obtained AFTER antibiotics. The second one is currently  negative  Nausea/vomiting - likely in the setting of #1, supportive treatment - clear liquid diet, advance as tolerated - without improvement today   Hypothyroidism - TSH elevated to 8, continue synthroid as TSH is affected by her acute illness, repeat TSH in 3-4 weeks as an outpatient  GAD/Depression - patient initially endorsed a set of medications that she is taking, however I discussed with pharmacy and she had a different story. Lithium level was low, unlikely she is taking it.  - continue Seroquel   Diet: Diet clear liquid Room service appropriate?: Yes; Fluid consistency:: Thin Fluids: NS DVT Prophylaxis: Lovenox  Code Status: Full Code Family Communication: daughter bedside  Disposition Plan: remain inpatient  Consultants:  None   Procedures:  None    Antibiotics Ceftriaxone 8/12 >> Tressie Ellis 8/13 >>   Studies  Ct Renal Stone Study  09/08/2014   CLINICAL DATA:  RIGHT flank pain, nausea, vomiting, history kidney stones  EXAM: CT ABDOMEN AND PELVIS WITHOUT CONTRAST  TECHNIQUE: Multidetector CT imaging of the abdomen and pelvis was performed following the standard protocol without IV contrast. Sagittal and coronal MPR images reconstructed from axial data set. Oral contrast not administered for this indication.  COMPARISON:  09/28/2013  FINDINGS: Lung bases clear.  Nonobstructing RIGHT renal calculi largest 4 mm diameter inferior pole.  Mild LEFT perinephric and peripelvic edema which can be seen with stone disease and infection.  Minimal prominence of the LEFT renal collecting system and proximal LEFT ureter with decompressed distal ureter.  No discrete point of obstruction or ureteral calcification identified.  Liver, spleen, pancreas, and adrenal glands unremarkable.  Normal appearing gallbladder and appendix by CT.  Bladder, uterus and adnexa normal  appearance.  Stomach and bowel loops grossly normal for technique.  No mass, adenopathy, free air or free fluid.  Osseous  structures unremarkable.  IMPRESSION: Nonobstructing small RIGHT renal calculi.  LEFT perinephric and peripelvic edema with minimal prominence of LEFT collecting system and proximal LEFT ureter though no discrete ureteral calculus is visualized.  This could represent a passed stone or pyelonephritis and correlation urinalysis is recommended.  These findings are on the opposite side from the RIGHT flank symptoms provided as history for this exam, recommend clinical correlation.   Electronically Signed   By: Lavonia Dana M.D.   On: 09/08/2014 14:52   Objective  Filed Vitals:   09/08/14 1815 09/08/14 2010 09/08/14 2125 09/09/14 0539  BP: 118/63 108/67  123/74  Pulse: 122 129  115  Temp: 99.8 F (37.7 C) 97.5 F (36.4 C) 99.8 F (37.7 C) 99.8 F (37.7 C)  TempSrc: Oral Oral Oral Oral  Resp: 18 16  14   Weight:      SpO2: 98% 98%  99%    Intake/Output Summary (Last 24 hours) at 09/09/14 1215 Last data filed at 09/09/14 0600  Gross per 24 hour  Intake 1503.33 ml  Output      0 ml  Net 1503.33 ml   Filed Weights   09/08/14 1329  Weight: 57.607 kg (127 lb)    Exam:  GENERAL: in pain  HEENT: head NCAT, no scleral icterus.  LUNGS: Clear to auscultation. No wheezing or crackles  HEART: Regular rate and rhythm without murmur. 2+ pulses, no JVD, no peripheral edema  ABDOMEN: Soft, mild tenderness throughout  EXTREMITIES: Without any cyanosis, clubbing, rash, lesions or edema.  NEUROLOGIC: Alert and oriented x3. Strength 5/5 in all 4.  Data Reviewed: Basic Metabolic Panel:  Recent Labs Lab 09/08/14 1459 09/09/14 0530  NA 139 141  K 3.6 3.9  CL 105 112*  CO2 22 23  GLUCOSE 132* 103*  BUN 10 9  CREATININE 1.03* 0.98  CALCIUM 9.0 7.8*   CBC:  Recent Labs Lab 09/08/14 1459 09/09/14 0530  WBC 20.8* 18.5*  NEUTROABS 18.4*  --   HGB 12.1 9.6*  HCT 37.0 30.3*  MCV 66.8* 66.7*  PLT 182 140*    Recent Results (from the past 240 hour(s))  Blood culture (routine x 2)      Status: None (Preliminary result)   Collection Time: 09/08/14  3:50 PM  Result Value Ref Range Status   Specimen Description BLOOD LEFT ARM  Final   Special Requests BOTTLES DRAWN AEROBIC AND ANAEROBIC 5CC  Final   Culture  Setup Time   Final    GRAM NEGATIVE RODS IN BOTH AEROBIC AND ANAEROBIC BOTTLES CRITICAL RESULT CALLED TO, READ BACK BY AND VERIFIED WITH: Christel Mormon [id number] [facility] Performed at [facility]    Culture PENDING  Incomplete   Report Status PENDING  Incomplete     Scheduled Meds: . antiseptic oral rinse  7 mL Mouth Rinse q12n4p  . cefTAZidime (FORTAZ)  IV  2 g Intravenous 3 times per day  . chlorhexidine  15 mL Mouth Rinse BID  . clonazePAM  2 mg Oral BID  . enoxaparin (LOVENOX) injection  40 mg Subcutaneous Q24H  . feeding supplement  1 Container Oral TID BM  . gabapentin  300 mg Oral TID  . levothyroxine  75 mcg Oral QAC breakfast  . megestrol  200 mg Oral Daily  . QUEtiapine  300 mg Oral QHS  . sodium chloride  3  mL Intravenous Q12H   Continuous Infusions: . sodium chloride 100 mL/hr at 09/09/14 0028    Marzetta Board, MD [facility] Pager [phone]. If 7 PM - 7 AM, please contact night-coverage at [email or web address], password [facility] 09/09/2014, 12:15 PM  LOS: 1 day

## 2014-09-09 NOTE — Progress Notes (Signed)
ANTIBIOTIC CONSULT NOTE - INITIAL  Pharmacy Consult for Primaxin Indication: GNR bacteremia  Allergies  Allergen Reactions  . Garlic Diarrhea    Vomiting    Patient Measurements: Height: 5\' 2"  (157.5 cm) Weight: 141 lb 8.6 oz (64.2 kg) IBW/kg (Calculated) : 50.1 Adjusted Body Weight:   Vital Signs: Temp: 102.9 F (39.4 C) (08/13 1601) Temp Source: Oral (08/13 1601) BP: 153/90 mmHg (08/13 1601) Pulse Rate: 128 (08/13 1601) Intake/Output from previous day: 08/12 0701 - 08/13 0700 In: 1503.3 [I.V.:1453.3; IV Piggyback:50] Out: -  Intake/Output from this shift:    Labs:  Recent Labs  09/08/14 1459 09/09/14 0530  WBC 20.8* 18.5*  HGB 12.1 9.6*  PLT 182 140*  CREATININE 1.03* 0.98   Estimated Creatinine Clearance: 62.4 mL/min (by C-G formula based on Cr of 0.98). No results for input(s): VANCOTROUGH, VANCOPEAK, VANCORANDOM, GENTTROUGH, GENTPEAK, GENTRANDOM, TOBRATROUGH, TOBRAPEAK, TOBRARND, AMIKACINPEAK, AMIKACINTROU, AMIKACIN in the last 72 hours.   Microbiology: Recent Results (from the past 720 hour(s))  Blood culture (routine x 2)     Status: None (Preliminary result)   Collection Time: 09/08/14  3:50 PM  Result Value Ref Range Status   Specimen Description BLOOD LEFT ARM  Final   Special Requests BOTTLES DRAWN AEROBIC AND ANAEROBIC 5CC  Final   Culture  Setup Time   Final    GRAM NEGATIVE RODS IN BOTH AEROBIC AND ANAEROBIC BOTTLES CRITICAL RESULT CALLED TO, READ BACK BY AND VERIFIED WITH: Christel Mormon [id number] [place]    Culture   Final    NO GROWTH < 24 HOURS Performed at [facility]    Report Status PENDING  Incomplete  Blood culture (routine x 2)     Status: None (Preliminary result)   Collection Time: 09/08/14  6:45 PM  Result Value Ref Range Status   Specimen Description BLOOD RIGHT HAND  Final   Special Requests BOTTLES DRAWN AEROBIC ONLY 5CC  Final   Culture   Final    NO GROWTH < 24 HOURS Performed at [facility]    Report Status PENDING  Incomplete    Medical History: Past Medical History  Diagnosis Date  . Arthritis   . Prolapsed disk   . Heart murmur   . Sleep deprivation   . Depression   . Sickle cell trait   . Hypothyroid   . Anxiety   . Renal disorder     Assessment: 9 yoF admitted with sepsis 2/2 pyelonephritis on 8/12 and started on IV antibiotics.  Pt continues to have bilateral flank pain and now also has GNR bacteremia with worsening fever.  Pharmacy consulted to change antibiotics to Primaxin.  No antibiotic allergies noted.   Anti-infectives 8/12 >> Ancef x1 dose 8/12 >> Ceftriaxone x1 dose  8/13 >> Ceftazidime x 2 doses  8/13 >> Primaxin >>   Vitals/Labs WBC: Elevated 18.5K Tm24h: 102.9 SCr = 0.98, CrCl ~62 ml/min (N80)  Cultures 8/12 bloodx2: 1st set growing GNRs, second set NGTD however drawn AFTER abx started 8/12 urine: collected   Goal of Therapy:  Eradication of infection  Plan:  Primaxin 500mg  IV q8h F/u renal fxn, cultures, Tm, clinical course  Ralene Bathe, PharmD, BCPS 09/09/2014, 4:37 PM  Pager: [phone]

## 2014-09-09 NOTE — Progress Notes (Signed)
Report called to [place]. Patient transferred to ICU. When patient transferring beds, 3 white pills (I.D'ed as tylenol) were found in sheets. Updated charting to reflect that tylenol was not given.  Barbee [place]. Brigitte Pulse, RN

## 2014-09-09 NOTE — Progress Notes (Signed)
ANTIBIOTIC CONSULT NOTE - INITIAL  Pharmacy Consult for ceftazidime Indication: bacteremia  Allergies  Allergen Reactions  . Garlic Diarrhea    Vomiting    Patient Measurements: Weight: 127 lb (57.607 kg)   Vital Signs: Temp: 99.8 F (37.7 C) (08/13 0539) Temp Source: Oral (08/13 0539) BP: 123/74 mmHg (08/13 0539) Pulse Rate: 115 (08/13 0539) Intake/Output from previous day: 08/12 0701 - 08/13 0700 In: 1503.3 [I.V.:1453.3; IV Piggyback:50] Out: -  Intake/Output from this shift:    Labs:  Recent Labs  09/08/14 1459 09/09/14 0530  WBC 20.8* 18.5*  HGB 12.1 9.6*  PLT 182 140*  CREATININE 1.03* 0.98   CrCl cannot be calculated (Unknown ideal weight.). No results for input(s): VANCOTROUGH, VANCOPEAK, VANCORANDOM, GENTTROUGH, GENTPEAK, GENTRANDOM, TOBRATROUGH, TOBRAPEAK, TOBRARND, AMIKACINPEAK, AMIKACINTROU, AMIKACIN in the last 72 hours.   Microbiology: Recent Results (from the past 720 hour(s))  Blood culture (routine x 2)     Status: None (Preliminary result)   Collection Time: 09/08/14  3:50 PM  Result Value Ref Range Status   Specimen Description BLOOD LEFT ARM  Final   Special Requests BOTTLES DRAWN AEROBIC AND ANAEROBIC 5CC  Final   Culture  Setup Time   Final    GRAM NEGATIVE RODS IN BOTH AEROBIC AND ANAEROBIC BOTTLES CRITICAL RESULT CALLED TO, READ BACK BY AND VERIFIED WITH: Christel Mormon [id number] [place] Performed at [facility]    Culture PENDING  Incomplete   Report Status PENDING  Incomplete    Medical History: Past Medical History  Diagnosis Date  . Arthritis   . Prolapsed disk   . Heart murmur   . Sleep deprivation   . Depression   . Sickle cell trait   . Hypothyroid   . Anxiety   . Renal disorder    Assessment: 49 y.o. female has a past medical history significant for Anxiety, depression, hypothyroidism, presents to the ED with 2 days of worsening bilateral flank pain more on right than left, lower abdominal pain, nausea  and vomiting.  UA with significant pyuria. Patient admitted for sepsis / pyelonephritis.   Pharmacy consulted to dose ceftazidime for bacteremia.  Goal of Therapy:  Ceftazidime per renal function and indication  Plan:  Ceftazidime 2gm IV q8h Follow renal function, cultures, clinical course  Dolly Rias RPh 09/09/2014, 7:52 AM Pager [phone]

## 2014-09-10 LAB — CBC
HEMATOCRIT: 30.4 % — AB (ref 36.0–46.0)
HEMOGLOBIN: 10.3 g/dL — AB (ref 12.0–15.0)
MCH: 22.5 pg — ABNORMAL LOW (ref 26.0–34.0)
MCHC: 33.9 g/dL (ref 30.0–36.0)
MCV: 66.5 fL — AB (ref 78.0–100.0)
Platelets: 132 10*3/uL — ABNORMAL LOW (ref 150–400)
RBC: 4.57 MIL/uL (ref 3.87–5.11)
RDW: 14.2 % (ref 11.5–15.5)
WBC: 10.1 10*3/uL (ref 4.0–10.5)

## 2014-09-10 LAB — COMPREHENSIVE METABOLIC PANEL
ALBUMIN: 2.8 g/dL — AB (ref 3.5–5.0)
ALT: 11 U/L — ABNORMAL LOW (ref 14–54)
ANION GAP: 6 (ref 5–15)
AST: 13 U/L — AB (ref 15–41)
Alkaline Phosphatase: 77 U/L (ref 38–126)
BUN: 9 mg/dL (ref 6–20)
CO2: 21 mmol/L — AB (ref 22–32)
CREATININE: 1.11 mg/dL — AB (ref 0.44–1.00)
Calcium: 8.5 mg/dL — ABNORMAL LOW (ref 8.9–10.3)
Chloride: 116 mmol/L — ABNORMAL HIGH (ref 101–111)
GFR calc Af Amer: >60 mL/min (ref >60)
GFR, EST NON AFRICAN AMERICAN: 58 mL/min — AB (ref >60)
Glucose, Bld: 108 mg/dL — ABNORMAL HIGH (ref 65–99)
POTASSIUM: 3.4 mmol/L — AB (ref 3.5–5.1)
Sodium: 143 mmol/L (ref 135–145)
Total Bilirubin: 0.6 mg/dL (ref 0.3–1.2)
Total Protein: 6.2 g/dL — ABNORMAL LOW (ref 6.5–8.1)

## 2014-09-10 LAB — URINE CULTURE: Culture: 1000

## 2014-09-10 MED ORDER — SODIUM CHLORIDE 0.9 % IV SOLN
250.0000 mg | Freq: Four times a day (QID) | INTRAVENOUS | Status: DC
  Administered 2014-09-10 – 2014-09-11 (×4): 250 mg via INTRAVENOUS
  Filled 2014-09-10 (×7): qty 250

## 2014-09-10 MED ORDER — LABETALOL HCL 5 MG/ML IV SOLN
5.0000 mg | INTRAVENOUS | Status: DC | PRN
  Administered 2014-09-10: 5 mg via INTRAVENOUS
  Filled 2014-09-10 (×3): qty 4

## 2014-09-10 NOTE — Progress Notes (Signed)
ANTIBIOTIC CONSULT NOTE - INITIAL  Pharmacy Consult for Primaxin Indication: GNR bacteremia  Allergies  Allergen Reactions  . Garlic Diarrhea    Vomiting    Patient Measurements: Height: 5\' 2"  (157.5 cm) Weight: 141 lb 8.6 oz (64.2 kg) IBW/kg (Calculated) : 50.1  Vital Signs: Temp: 98.9 F (37.2 C) (08/14 0400) Temp Source: Oral (08/14 0400) BP: 139/97 mmHg (08/14 0530) Pulse Rate: 118 (08/14 0530) Intake/Output from previous day: 08/13 0701 - 08/14 0700 In: 200 [IV Piggyback:200] Out: 2440 [Urine:1825] Intake/Output from this shift:    Labs:  Recent Labs  09/08/14 1459 09/09/14 0530 09/10/14 0335  WBC 20.8* 18.5* 10.1  HGB 12.1 9.6* 10.3*  PLT 182 140* 132*  CREATININE 1.03* 0.98 1.11*   Estimated Creatinine Clearance: 55.1 mL/min (by C-G formula based on Cr of 1.11). No results for input(s): VANCOTROUGH, VANCOPEAK, VANCORANDOM, GENTTROUGH, GENTPEAK, GENTRANDOM, TOBRATROUGH, TOBRAPEAK, TOBRARND, AMIKACINPEAK, AMIKACINTROU, AMIKACIN in the last 72 hours.   Microbiology: Recent Results (from the past 720 hour(s))  Blood culture (routine x 2)     Status: None (Preliminary result)   Collection Time: 09/08/14  3:50 PM  Result Value Ref Range Status   Specimen Description BLOOD LEFT ARM  Final   Special Requests BOTTLES DRAWN AEROBIC AND ANAEROBIC 5CC  Final   Culture  Setup Time   Final    GRAM NEGATIVE RODS IN BOTH AEROBIC AND ANAEROBIC BOTTLES CRITICAL RESULT CALLED TO, READ BACK BY AND VERIFIED WITH: Christel Mormon [id number] [place]    Culture   Final    NO GROWTH < 24 HOURS Performed at [facility]    Report Status PENDING  Incomplete  Blood culture (routine x 2)     Status: None (Preliminary result)   Collection Time: 09/08/14  6:45 PM  Result Value Ref Range Status   Specimen Description BLOOD RIGHT HAND  Final   Special Requests BOTTLES DRAWN AEROBIC ONLY 5CC  Final   Culture   Final    NO GROWTH < 24 HOURS Performed at [facility]    Report Status PENDING  Incomplete  MRSA PCR Screening     Status: None   Collection Time: 09/09/14  1:05 PM  Result Value Ref Range Status   MRSA by PCR NEGATIVE NEGATIVE Final    Comment:        The GeneXpert MRSA Assay (FDA approved for NASAL specimens only), is one component of a comprehensive MRSA colonization surveillance program. It is not intended to diagnose MRSA infection nor to guide or monitor treatment for MRSA infections.     Medical History: Past Medical History  Diagnosis Date  . Arthritis   . Prolapsed disk   . Heart murmur   . Sleep deprivation   . Depression   . Sickle cell trait   . Hypothyroid   . Anxiety   . Renal disorder     Assessment: 81 yoF admitted with sepsis 2/2 pyelonephritis on 8/12 and started on IV antibiotics.  Pt continues to have bilateral flank pain and now also has GNR bacteremia with worsening fever.  Pharmacy consulted to change antibiotics to Primaxin.  No antibiotic allergies noted.   Anti-infectives 8/12 >> Ancef x1 dose 8/12 >> Ceftriaxone x1 dose  8/13 >> Ceftazidime x 2 doses  8/13 >> Primaxin >>   Vitals/Labs WBC: Elevated 18.5K Tm24h:103 SCr = up slightly 1.11 (crcl~55-63)  Cultures 8/12 bloodx2: 1st set growing GNRs, second set NGTD however drawn AFTER abx started 8/12 urine: collected  Plan:  - change primaxin to 250 mg IV q6h (adjusted for renal function and weight) - f/u cultures, renal function  Dia Sitter, PharmD, BCPS 09/10/2014 7:26 AM

## 2014-09-10 NOTE — Progress Notes (Signed)
PROGRESS NOTE  Victoria Flores HGD:[id number] DOB: September 01, 1965 DOA: 09/08/2014 PCP: Philis Fendt, MD   HPI: 49 y.o. female has a past medical history significant for Anxiety, depression, hypothyroidism, presented to the ED with 2 days of worsening bilateral flank pain more on right than left, lower abdominal pain, nausea and vomiting. She was admitted with sepsis of urinary origin.   Subjective / 24 H Interval events - complains of ongoing severe pain, shortness of breath  - denies chest pain  Assessment/Plan: Principal Problem:   Sepsis Active Problems:   Nausea and vomiting in adult   Depression   Hypothyroidism   GAD (generalized anxiety disorder)   Pyelonephritis   Bacteremia   Sepsis of urinary source and with pyelonephritis - patient transferred to SDU yesterday due to worsening tachycardia, fever and worsening respiratory status - remains tachycardic and tachypneic this morning - continue to monitor in SDU, she remains critically ill - continue Primaxin, this was broadened on transfer last night - she had CT abdomen and pelvis last night with contrast, no evidence of hydronephrosis or abscess, left sided pyelonephrosis is evident and worsening  UTI with pyelonephritis - urine cultures still NGTD  Gram negative bacteremia - one set, initially she refused a second set, however it was eventually obtained AFTER antibiotics.  - second set still negative today  Nausea/vomiting - likely in the setting of #1, supportive treatment - clear liquid diet, advance as tolerated - ongoing nausea and poor po intake  Hypothyroidism - TSH elevated to 8, continue synthroid as TSH is affected by her acute illness, repeat TSH in 3-4 weeks as an outpatient  GAD/Depression - patient initially endorsed a set of medications that she is taking, however I discussed with pharmacy and she had a different story. Lithium level was low, unlikely she is taking it.  - continue  Seroquel  Anemia - in the setting of acute illness, monitor  Thrombocytopenia - in the setting of acute illness, monitor   Diet: Diet clear liquid Room service appropriate?: Yes; Fluid consistency:: Thin Fluids: NS DVT Prophylaxis: Lovenox  Code Status: Full Code Family Communication: d/w mother  Disposition Plan: remain inpatient  Consultants:  PCCM  Procedures:  None    Antibiotics Ceftriaxone 8/12 >> 8/13, Fortaz 8/13  Primaxin 8/13 >>   Studies  Ct Abdomen Pelvis W Contrast 09/10/2014 1. Diffuse left-sided pyelonephritis and ureteritis, with surrounding soft tissue inflammation. The appearance is worsened from the prior study. No evidence of hydronephrosis. 2. Small focus of enhancement within the endometrial canal may reflect a small endometrial polyp. Pelvic ultrasound could be considered for further evaluation, when and as deemed clinically appropriate. 3. Mild bibasilar atelectasis noted.    Dg Chest Port 1 View 09/09/2014 Lower lung volumes with diffuse interstitial opacity and mildly increased cardiac silhouette.  Main differential considerations are acute pulmonary edema and viral/atypical pneumonia. No pleural effusion identified.   Ct Renal Stone Study 09/08/2014 Nonobstructing small RIGHT renal calculi.  LEFT perinephric and peripelvic edema with minimal prominence of LEFT collecting system and proximal LEFT ureter though no discrete ureteral calculus is visualized.  This could represent a passed stone or pyelonephritis and correlation urinalysis is recommended.  These findings are on the opposite side from the RIGHT flank symptoms provided as history for this exam, recommend clinical correlation.    Objective  Filed Vitals:   09/10/14 0400 09/10/14 0430 09/10/14 0500 09/10/14 0530  BP: 150/87 162/92 154/97 139/97  Pulse: 119 120 118 118  Temp: 98.9  F (37.2 C)     TempSrc: Oral     Resp: 34 38 33 26  Height:      Weight:      SpO2: 98% 100% 98% 97%     Intake/Output Summary (Last 24 hours) at 09/10/14 0757 Last data filed at 09/10/14 0528  Gross per 24 hour  Intake    200 ml  Output   1825 ml  Net  -1625 ml   Filed Weights   09/08/14 1329 09/09/14 1420  Weight: 57.607 kg (127 lb) 64.2 kg (141 lb 8.6 oz)    Exam:  GENERAL: in pain, appears very uncomfortable  HEENT: head NCAT, no scleral icterus.  LUNGS: diffuse coarse breath sounds, no wheeznig  HEART: Regular rate and rhythm without murmur. 2+ pulses. Tachycardic.   ABDOMEN: Soft, tender in all quadrants   EXTREMITIES: Without any cyanosis, clubbing, rash, lesions  NEUROLOGIC: Alert and oriented x3.   Data Reviewed: Basic Metabolic Panel:  Recent Labs Lab 09/08/14 1459 09/09/14 0530 09/10/14 0335  NA 139 141 143  K 3.6 3.9 3.4*  CL 105 112* 116*  CO2 22 23 21*  GLUCOSE 132* 103* 108*  BUN 10 9 9   CREATININE 1.03* 0.98 1.11*  CALCIUM 9.0 7.8* 8.5*   CBC:  Recent Labs Lab 09/08/14 1459 09/09/14 0530 09/10/14 0335  WBC 20.8* 18.5* 10.1  NEUTROABS 18.4*  --   --   HGB 12.1 9.6* 10.3*  HCT 37.0 30.3* 30.4*  MCV 66.8* 66.7* 66.5*  PLT 182 140* 132*    Recent Results (from the past 240 hour(s))  Blood culture (routine x 2)     Status: None (Preliminary result)   Collection Time: 09/08/14  3:50 PM  Result Value Ref Range Status   Specimen Description BLOOD LEFT ARM  Final   Special Requests BOTTLES DRAWN AEROBIC AND ANAEROBIC 5CC  Final   Culture  Setup Time   Final    GRAM NEGATIVE RODS IN BOTH AEROBIC AND ANAEROBIC BOTTLES CRITICAL RESULT CALLED TO, READ BACK BY AND VERIFIED WITH: Christel Mormon [phone] [place]    Culture   Final    NO GROWTH < 24 HOURS Performed at [facility]    Report Status PENDING  Incomplete  Blood culture (routine x 2)     Status: None (Preliminary result)   Collection Time: 09/08/14  6:45 PM  Result Value Ref Range Status   Specimen Description BLOOD RIGHT HAND  Final   Special Requests BOTTLES  DRAWN AEROBIC ONLY 5CC  Final   Culture   Final    NO GROWTH < 24 HOURS Performed at [facility]    Report Status PENDING  Incomplete  MRSA PCR Screening     Status: None   Collection Time: 09/09/14  1:05 PM  Result Value Ref Range Status   MRSA by PCR NEGATIVE NEGATIVE Final    Comment:        The GeneXpert MRSA Assay (FDA approved for NASAL specimens only), is one component of a comprehensive MRSA colonization surveillance program. It is not intended to diagnose MRSA infection nor to guide or monitor treatment for MRSA infections.      Scheduled Meds: . antiseptic oral rinse  7 mL Mouth Rinse q12n4p  . chlorhexidine  15 mL Mouth Rinse BID  . enoxaparin (LOVENOX) injection  40 mg Subcutaneous Q24H  . feeding supplement  1 Container Oral TID BM  . gabapentin  300 mg Oral TID  . imipenem-cilastatin  250 mg Intravenous Q6H  . levothyroxine  75 mcg Oral QAC breakfast  . megestrol  200 mg Oral Daily  . QUEtiapine  300 mg Oral QHS  . sodium chloride  3 mL Intravenous Q12H   Continuous Infusions:    Marzetta Board, MD [facility] Pager [phone]. If 7 PM - 7 AM, please contact night-coverage at [email or web address], password [email or web address] 09/10/2014, 7:57 AM  LOS: 2 days

## 2014-09-11 DIAGNOSIS — R7881 Bacteremia: Secondary | ICD-10-CM | POA: Diagnosis present

## 2014-09-11 DIAGNOSIS — A4151 Sepsis due to Escherichia coli [E. coli]: Secondary | ICD-10-CM

## 2014-09-11 LAB — CBC
HCT: 30.6 % — ABNORMAL LOW (ref 36.0–46.0)
HEMOGLOBIN: 10.2 g/dL — AB (ref 12.0–15.0)
MCH: 21.8 pg — ABNORMAL LOW (ref 26.0–34.0)
MCHC: 33.3 g/dL (ref 30.0–36.0)
MCV: 65.5 fL — ABNORMAL LOW (ref 78.0–100.0)
PLATELETS: 152 10*3/uL (ref 150–400)
RBC: 4.67 MIL/uL (ref 3.87–5.11)
RDW: 14.4 % (ref 11.5–15.5)
WBC: 7.3 10*3/uL (ref 4.0–10.5)

## 2014-09-11 LAB — CULTURE, BLOOD (ROUTINE X 2)

## 2014-09-11 LAB — BASIC METABOLIC PANEL
ANION GAP: 8 (ref 5–15)
BUN: 8 mg/dL (ref 6–20)
CALCIUM: 8.9 mg/dL (ref 8.9–10.3)
CO2: 20 mmol/L — ABNORMAL LOW (ref 22–32)
Chloride: 114 mmol/L — ABNORMAL HIGH (ref 101–111)
Creatinine, Ser: 1.08 mg/dL — ABNORMAL HIGH (ref 0.44–1.00)
GLUCOSE: 101 mg/dL — AB (ref 65–99)
Potassium: 3.5 mmol/L (ref 3.5–5.1)
Sodium: 142 mmol/L (ref 135–145)

## 2014-09-11 MED ORDER — DEXTROSE 5 % IV SOLN: 1.0000 g | INTRAVENOUS | Status: DC

## 2014-09-11 MED ORDER — DEXTROSE 5 % IV SOLN
2.0000 g | INTRAVENOUS | Status: DC
  Administered 2014-09-11 – 2014-09-12 (×2): 2 g via INTRAVENOUS
  Filled 2014-09-11 (×2): qty 2

## 2014-09-11 MED ORDER — LORAZEPAM 0.5 MG PO TABS
0.5000 mg | ORAL_TABLET | Freq: Four times a day (QID) | ORAL | Status: DC | PRN
  Administered 2014-09-11 – 2014-09-12 (×3): 0.5 mg via ORAL
  Filled 2014-09-11 (×3): qty 1

## 2014-09-11 NOTE — Progress Notes (Addendum)
PROGRESS NOTE  Victoria Flores MVE:[id number] DOB: [birth year] DOA: 09/08/2014 PCP: Philis Fendt, MD   HPI: 49 y.o. female has a past medical history significant for Anxiety, depression, hypothyroidism, presented to the ED with 2 days of worsening bilateral flank pain more on right than left, lower abdominal pain, nausea and vomiting. She was admitted with sepsis of urinary origin.    Subjective / 24 H Interval events - Feeling better overall with increased cough. Persisting severe lower back and abdominal pain - 1 episode of vomiting last night, no nausea  Assessment/Plan: Principal Problem:   Sepsis Active Problems:   Nausea and vomiting in adult   Depression   Hypothyroidism   GAD (generalized anxiety disorder)   Pyelonephritis   E coli bacteremia  Sepsis of urinary source and with pyelonephritis - Patient transferred to SDU on 09/09/14 due to worsening tachycardia, fever and worsening respiratory status.  - sepsis physiology is now improving - blood cultures speciated E coli resistant only to Ampicillin, narrow back to Ceftriaxone - given persistent tachycardia and tachypnea overnight I don't feel like she is ready for po antibiotics just yet   UTI with pyelonephritis - Urine cultures final, with insignificant growth   E coli bacteremia - One set, initially she refused a second set, however it was eventually obtained AFTER antibiotics.  - Second set obtained AFTER antibiotics have remained negative, final report pending  Nausea/vomiting - Likely in the setting of #1, supportive treatment, resolving  Hypothyroidism - TSH elevated to 8, continue synthroid as TSH is affected by her acute illness, repeat TSH in 3-4 weeks as an outpatient  GAD/Depression - Patient initially endorsed a set of medications that she is taking, however, I discussed with pharmacy and she had a different story. Lithium level was low, unlikely she is taking it.  - Continue  Seroquel  Anemia - In the setting of acute illness, H/H stable  Thrombocytopenia - resolved   Diet: Diet regular Room service appropriate?: Yes; Fluid consistency:: Thin Fluids: NS DVT Prophylaxis: Lovenox  Code Status: Full Code Family Communication: None at bedside  Disposition Plan: Remain inpatient  Consultants:  PCCM  Procedures:  None  Antibiotics  Ceftriaxone 8/12 >> 8/13, Fortaz 8/13  Primaxin 8/13 >>  Studies  Ct Abdomen Pelvis W Contrast  09/10/2014   CLINICAL DATA:  Acute onset of bilateral flank pain. Bacteremia and fever. Subsequent encounter.  EXAM: CT ABDOMEN AND PELVIS WITH CONTRAST  TECHNIQUE: Multidetector CT imaging of the abdomen and pelvis was performed using the standard protocol following bolus administration of intravenous contrast.  CONTRAST:  134mL OMNIPAQUE IOHEXOL 300 MG/ML  SOLN  COMPARISON:  CT of the abdomen and pelvis performed 09/08/2014  FINDINGS: Mild bibasilar atelectasis is noted.  The liver and spleen are unremarkable in appearance. The gallbladder is within normal limits. The pancreas and adrenal glands are unremarkable.  Diffusely decreased enhancement is noted within the left kidney, particularly prominent on delayed images, compatible with left-sided pyelonephritis. Left-sided perinephric stranding and fluid is seen, and there is enhancement along the wall of the left ureter, compatible with left-sided ureteritis. No significant hydronephrosis is seen. Soft tissue inflammation tracks inferiorly along the course of the left ureter. The right kidney demonstrates a 4 mm stone near its lower pole. The right kidney is otherwise unremarkable.  No free fluid is identified. The small bowel is unremarkable in appearance. The stomach is within normal limits. No acute vascular abnormalities are seen.  The appendix is normal in caliber, without  evidence of appendicitis. The colon is unremarkable in appearance.  The bladder is mildly distended and  grossly unremarkable. A small focus of enhancement within the endometrial canal may reflect a small polyp. The uterus is otherwise unremarkable. The ovaries are grossly symmetric. No suspicious adnexal masses are seen. No inguinal lymphadenopathy is seen.  No acute osseous abnormalities are identified.  IMPRESSION: 1. Diffuse left-sided pyelonephritis and ureteritis, with surrounding soft tissue inflammation. The appearance is worsened from the prior study. No evidence of hydronephrosis. 2. Small focus of enhancement within the endometrial canal may reflect a small endometrial polyp. Pelvic ultrasound could be considered for further evaluation, when and as deemed clinically appropriate. 3. Mild bibasilar atelectasis noted.   Electronically Signed   By: Garald Balding M.D.   On: 09/10/2014 04:08   Dg Chest Port 1 View  09/09/2014   CLINICAL DATA:  49 year old female with shortness of breath today. Initial encounter.  EXAM: PORTABLE CHEST - 1 VIEW  COMPARISON:  09/28/2013 and earlier.  FINDINGS: Portable AP semi upright view at 1625 hrs. Lower lung volumes. Stable to mildly increased cardiac silhouette. Other mediastinal contours are within normal limits. Diffusely increased pulmonary interstitial opacity is new. No pneumothorax, pleural effusion or consolidation. Visualized tracheal air column is within normal limits.  IMPRESSION: Lower lung volumes with diffuse interstitial opacity and mildly increased cardiac silhouette.  Main differential considerations are acute pulmonary edema and viral/atypical pneumonia. No pleural effusion identified.   Electronically Signed   By: Genevie Ann M.D.   On: 09/09/2014 16:37    Objective  Filed Vitals:   09/11/14 0728 09/11/14 0731 09/11/14 0800 09/11/14 1000  BP:  125/92 146/97 155/92  Pulse:    103  Temp:   99.1 F (37.3 C)   TempSrc:   Oral   Resp: 22 24 17 17   Height:      Weight:      SpO2:   99% 100%    Intake/Output Summary (Last 24 hours) at 09/11/14  1019 Last data filed at 09/11/14 0900  Gross per 24 hour  Intake   1280 ml  Output   1200 ml  Net     80 ml   Filed Weights   09/08/14 1329 09/09/14 1420  Weight: 57.607 kg (127 lb) 64.2 kg (141 lb 8.6 oz)    Exam:  GENERAL: Patient laying sidways on bed in no acute distress.   HEENT: head NCAT  LUNGS: Clear to auscultation. No wheezing or crackles. Normal respiratory effort.   HEART: Regular rate and rhythm without murmur. No peripheral edema. Mild tachycardia.  ABDOMEN: Soft, diffuse tenderness to palpation. No guarding or peritoneal signs.   EXTREMITIES: Without any cyanosis, clubbing, rash or lesions  NEUROLOGIC: Alert and oriented x3.   Data Reviewed: Basic Metabolic Panel:  Recent Labs Lab 09/08/14 1459 09/09/14 0530 09/10/14 0335 09/11/14 0239  NA 139 141 143 142  K 3.6 3.9 3.4* 3.5  CL 105 112* 116* 114*  CO2 22 23 21* 20*  GLUCOSE 132* 103* 108* 101*  BUN 10 9 9 8   CREATININE 1.03* 0.98 1.11* 1.08*  CALCIUM 9.0 7.8* 8.5* 8.9   Liver Function Tests:  Recent Labs Lab 09/10/14 0335  AST 13*  ALT 11*  ALKPHOS 77  BILITOT 0.6  PROT 6.2*  ALBUMIN 2.8*   CBC:  Recent Labs Lab 09/08/14 1459 09/09/14 0530 09/10/14 0335 09/11/14 0239  WBC 20.8* 18.5* 10.1 7.3  NEUTROABS 18.4*  --   --   --  HGB 12.1 9.6* 10.3* 10.2*  HCT 37.0 30.3* 30.4* 30.6*  MCV 66.8* 66.7* 66.5* 65.5*  PLT 182 140* 132* 152     Recent Results (from the past 240 hour(s))  Culture, Urine     Status: None   Collection Time: 09/08/14  3:49 PM  Result Value Ref Range Status   Specimen Description URINE, CLEAN CATCH  Final   Special Requests NONE  Final   Culture   Final    1,000 COLONIES/mL INSIGNIFICANT GROWTH Performed at [facility]    Report Status 09/10/2014 FINAL  Final  Blood culture (routine x 2)     Status: None   Collection Time: 09/08/14  3:50 PM  Result Value Ref Range Status   Specimen Description BLOOD LEFT ARM  Final   Special Requests  BOTTLES DRAWN AEROBIC AND ANAEROBIC 5CC  Final   Culture  Setup Time   Final    GRAM NEGATIVE RODS IN BOTH AEROBIC AND ANAEROBIC BOTTLES CRITICAL RESULT CALLED TO, READ BACK BY AND VERIFIED WITH: Christel Mormon [phone] [place]    Culture   Final    ESCHERICHIA COLI Performed at [facility]    Report Status 09/11/2014 FINAL  Final   Organism ID, Bacteria ESCHERICHIA COLI  Final      Susceptibility   Escherichia coli - MIC*    AMPICILLIN >=32 RESISTANT Resistant     CEFAZOLIN <=4 SENSITIVE Sensitive     CEFEPIME <=1 SENSITIVE Sensitive     CEFTAZIDIME <=1 SENSITIVE Sensitive     CEFTRIAXONE <=1 SENSITIVE Sensitive     CIPROFLOXACIN <=0.25 SENSITIVE Sensitive     GENTAMICIN <=1 SENSITIVE Sensitive     IMIPENEM <=0.25 SENSITIVE Sensitive     TRIMETH/SULFA <=20 SENSITIVE Sensitive     AMPICILLIN/SULBACTAM 16 INTERMEDIATE Intermediate     PIP/TAZO <=4 SENSITIVE Sensitive     * ESCHERICHIA COLI  Blood culture (routine x 2)     Status: None (Preliminary result)   Collection Time: 09/08/14  6:45 PM  Result Value Ref Range Status   Specimen Description BLOOD RIGHT HAND  Final   Special Requests BOTTLES DRAWN AEROBIC ONLY 5CC  Final   Culture   Final    NO GROWTH 2 DAYS Performed at [facility]    Report Status PENDING  Incomplete  MRSA PCR Screening     Status: None   Collection Time: 09/09/14  1:05 PM  Result Value Ref Range Status   MRSA by PCR NEGATIVE NEGATIVE Final    Comment:        The GeneXpert MRSA Assay (FDA approved for NASAL specimens only), is one component of a comprehensive MRSA colonization surveillance program. It is not intended to diagnose MRSA infection nor to guide or monitor treatment for MRSA infections.      Scheduled Meds: . antiseptic oral rinse  7 mL Mouth Rinse q12n4p  . chlorhexidine  15 mL Mouth Rinse BID  . enoxaparin (LOVENOX) injection  40 mg Subcutaneous Q24H  . feeding supplement  1 Container Oral TID BM  .  gabapentin  300 mg Oral TID  . imipenem-cilastatin  250 mg Intravenous Q6H  . levothyroxine  75 mcg Oral QAC breakfast  . megestrol  200 mg Oral Daily  . QUEtiapine  300 mg Oral QHS  . sodium chloride  3 mL Intravenous Q12H    Fleet Contras, PA-S  Marzetta Board, MD [facility] Pager [phone]. If 7 PM - 7 AM, please contact night-coverage at  [email or web address], password [facility] 09/11/2014, 10:19 AM  LOS: 3 days

## 2014-09-11 NOTE — Care Management Note (Signed)
Case Management Note  Patient Details  Name: Konstantina Nachreiner MRN: [id number] Date of Birth: Mar 01, 1965  Subjective/Objective:       Date:  September 11, 2014 U.R. performed for needs and level of care. Will continue to follow for Case Management needs.  Velva Harman, RN, BSN, [place]   [phone]             Action/Plan:   Expected Discharge Date:   (unknown)               Expected Discharge Plan:  Home/Self Care  In-House Referral:  NA  Discharge planning Services  CM Consult  Post Acute Care Choice:  NA Choice offered to:  NA  DME Arranged:  N/A DME Agency:  NA  HH Arranged:  NA HH Agency:  NA  Status of Service:  In process, will continue to follow  Medicare Important Message Given:    Date Medicare IM Given:    Medicare IM give by:    Date Additional Medicare IM Given:    Additional Medicare Important Message give by:     If discussed at [place] of Stay Meetings, dates discussed:    Additional Comments:  Leeroy Cha, RN 09/11/2014, 10:09 AM

## 2014-09-11 NOTE — Progress Notes (Signed)
Called report to 4 [facility]. Will transfer patient with all belongs. Patient to update family on room change.

## 2014-09-11 NOTE — Progress Notes (Signed)
Initial Nutrition Assessment  DOCUMENTATION CODES:   Not applicable  INTERVENTION:  - Will order Boost Breeze TID, each supplement provides 250 kcal and 9 grams of protein - RD will continue to monitor for needs  NUTRITION DIAGNOSIS:   Inadequate oral intake related to acute illness, nausea as evidenced by per patient/family report, meal completion < 25%.   GOAL:   Patient will meet greater than or equal to 90% of their needs   MONITOR:   PO intake, Supplement acceptance, Weight trends, Labs, I & O's  REASON FOR ASSESSMENT:   Malnutrition Screening Tool  ASSESSMENT:   49 y.o. female has a past medical history significant for Anxiety, depression, hypothyroidism, presents to the ED with 2 days of worsening bilateral flank pain more on right than left, lower abdominal pain, nausea and vomiting. Patient has been trying to take her home Percocet for the pain and it wasn't helping. She also had a fever of 100.6 at home and then decided to come to the ED. She endorses a history of kidney stones, she endorses chills at home. She has a history of chronic pain and is on chronic Percocet. She endorses intermittent chest pains and also dyspnea as she feels like she can't take a deep breath due to back/flank pain. She endorses a chronic history of multiple joint pains. She endorses lightheadedness. She denies weight gain/loss recently. Denies diarrhea, blood in her stool or urine.   Pt seen for MST. BMI indicates overweight status. Pt states she had juice, lemonade, and water this AM but has been told she is not able to have soda. She did not have any solid foods for breakfast.  Pt reports poor appetite with minimal to no intakes x2 days PTA related to nausea/vomiting/abdominal pain. She states nausea was exacerbated with intake of liquids this AM.  She states she has been having weight fluctuations recently with weight ranging from 128-135 lbs as she was not on her thyroid medication  consistently. Current weight is above these ranges and previous weight hx in chart is limited.   Unable to do physical assessment this AM as pt was slightly agitated and had been calling out in pain when RN was conducting abdominal exam.  Not meeting needs. Medications reviewed. Labs reviewed; Cl: 114 mmol/L, creatinine elevated.  Diet Order:  Diet regular Room service appropriate?: Yes; Fluid consistency:: Thin  Skin:  Reviewed, no issues  Last BM:  8/14  Height:   Ht Readings from Last 1 Encounters:  09/09/14 5\' 2"  (1.575 m)    Weight:   Wt Readings from Last 1 Encounters:  09/09/14 141 lb 8.6 oz (64.2 kg)    Ideal Body Weight:  50 kg  BMI:  Body mass index is 25.88 kg/(m^2).  Estimated Nutritional Needs:   Kcal:  1600-1800  Protein:  65-75 grams  Fluid:  2.2 L/day  EDUCATION NEEDS:   No education needs identified at this time     Jarome Matin, RD, LDN Inpatient Clinical Dietitian Pager # [phone] After hours/weekend pager # [phone]

## 2014-09-11 NOTE — Care Management Important Message (Signed)
Important Message  Patient Details  Name: Victoria Flores MRN: [id number] Date of Birth: 1965/03/24   Medicare Important Message Given:  Yes-second notification given    Shelda Altes 09/11/2014, 4:51 [place] Message  Patient Details  Name: Victoria Flores MRN: [id number] Date of Birth: [birth year]   Medicare Important Message Given:  Yes-second notification given    Shelda Altes 09/11/2014, 4:51 PM

## 2014-09-12 DIAGNOSIS — A419 Sepsis, unspecified organism: Principal | ICD-10-CM

## 2014-09-12 DIAGNOSIS — N390 Urinary tract infection, site not specified: Secondary | ICD-10-CM

## 2014-09-12 DIAGNOSIS — N12 Tubulo-interstitial nephritis, not specified as acute or chronic: Secondary | ICD-10-CM

## 2014-09-12 DIAGNOSIS — F411 Generalized anxiety disorder: Secondary | ICD-10-CM

## 2014-09-12 DIAGNOSIS — R7881 Bacteremia: Secondary | ICD-10-CM

## 2014-09-12 MED ORDER — KETOROLAC TROMETHAMINE 15 MG/ML IJ SOLN
15.0000 mg | Freq: Four times a day (QID) | INTRAMUSCULAR | Status: DC | PRN
  Administered 2014-09-12 – 2014-09-13 (×2): 15 mg via INTRAVENOUS
  Filled 2014-09-12 (×2): qty 1

## 2014-09-12 MED ORDER — CEFUROXIME AXETIL 250 MG PO TABS
250.0000 mg | ORAL_TABLET | Freq: Two times a day (BID) | ORAL | Status: DC
  Administered 2014-09-12 – 2014-09-13 (×2): 250 mg via ORAL
  Filled 2014-09-12 (×3): qty 1

## 2014-09-12 MED ORDER — TRAZODONE HCL 50 MG PO TABS: 50.0000 mg | ORAL_TABLET | Freq: Every evening | ORAL | Status: DC | PRN

## 2014-09-12 NOTE — Progress Notes (Signed)
[facility] PROGRESS NOTE  Victoria Flores WVP:[id number] DOB: [birth year] DOA: 09/08/2014 PCP: Philis Fendt, MD  Assessment/Plan: Sepsis of urinary source and with pyelonephritis - Patient transferred to SDU on 09/09/14 due to worsening tachycardia, fever and worsening respiratory status.  - sepsis physiology has resolved - blood cultures speciated E coli resistant only to Ampicillin, narrowed back to Ceftriaxone - Continued to improve overnight thus transitioned to ceftin PO BID - Give trial of toradol for pain. Renal function reviewed and is normal  UTI with pyelonephritis - Urine cultures final, with insignificant growth   E coli bacteremia - One set, initially she refused a second set, however it was eventually obtained AFTER antibiotics.  - Second set obtained AFTER antibiotics have remained negative, final report pending  Nausea/vomiting - Likely in the setting of #1, supportive treatment, resolving  Hypothyroidism - TSH elevated to 8, continue synthroid as TSH is affected by her acute illness, repeat TSH in 3-4 weeks as an outpatient  GAD/Depression - Patient initially endorsed a set of medications that she is taking, however, Dr. Cruzita Lederer had discussed with pharmacy and she had a different story. Lithium level was low, unlikely she is taking it.  - Continue Seroquel for now  Anemia - In the setting of acute illness, H/H stable  Thrombocytopenia - resolved  Code Status: Full Family Communication: Pt in room (indicate person spoken with, relationship, and if by phone, the number) Disposition Plan: Pending   Consultants:    Procedures:    Antibiotics:  Ceftriaxone 8/12 >>8/16  Ceftin 8/16>>>  HPI/Subjective: Complains of R sided flank and low back pain, tearful  Objective: Filed Vitals:   09/11/14 1350 09/11/14 2234 09/12/14 0415 09/12/14 1504  BP: 123/90 118/84 101/76 119/79  Pulse: 101  109 112  Temp: 99.4 F (37.4 C) 98.6 F (37 C)  99 F (37.2 C) 99 F (37.2 C)  TempSrc: Oral Oral Oral Oral  Resp: 20 20 20 20   Height:      Weight:      SpO2: 100% 100% 100% 100%    Intake/Output Summary (Last 24 hours) at 09/12/14 1846 Last data filed at 09/12/14 1310  Gross per 24 hour  Intake     50 ml  Output      0 ml  Net     50 ml   Filed Weights   09/08/14 1329 09/09/14 1420 09/11/14 1055  Weight: 57.607 kg (127 lb) 64.2 kg (141 lb 8.6 oz) 61.7 kg (136 lb 0.4 oz)    Exam:   General:  Awake, in nad  Cardiovascular: regular,s 1, s2  Respiratory: normal resp effort, no wheezing  Abdomen: soft,nondistneded  Musculoskeletal: perfused, no clubbing   Data Reviewed: Basic Metabolic Panel:  Recent Labs Lab 09/08/14 1459 09/09/14 0530 09/10/14 0335 09/11/14 0239  NA 139 141 143 142  K 3.6 3.9 3.4* 3.5  CL 105 112* 116* 114*  CO2 22 23 21* 20*  GLUCOSE 132* 103* 108* 101*  BUN 10 9 9 8   CREATININE 1.03* 0.98 1.11* 1.08*  CALCIUM 9.0 7.8* 8.5* 8.9   Liver Function Tests:  Recent Labs Lab 09/10/14 0335  AST 13*  ALT 11*  ALKPHOS 77  BILITOT 0.6  PROT 6.2*  ALBUMIN 2.8*   No results for input(s): LIPASE, AMYLASE in the last 168 hours. No results for input(s): AMMONIA in the last 168 hours. CBC:  Recent Labs Lab 09/08/14 1459 09/09/14 0530 09/10/14 0335 09/11/14 0239  WBC 20.8* 18.5* 10.1 7.3  NEUTROABS 18.4*  --   --   --   HGB 12.1 9.6* 10.3* 10.2*  HCT 37.0 30.3* 30.4* 30.6*  MCV 66.8* 66.7* 66.5* 65.5*  PLT 182 140* 132* 152   Cardiac Enzymes: No results for input(s): CKTOTAL, CKMB, CKMBINDEX, TROPONINI in the last 168 hours. BNP (last 3 results) No results for input(s): BNP in the last 8760 hours.  ProBNP (last 3 results) No results for input(s): PROBNP in the last 8760 hours.  CBG: No results for input(s): GLUCAP in the last 168 hours.  Recent Results (from the past 240 hour(s))  Culture, Urine     Status: None   Collection Time: 09/08/14  3:49 PM  Result Value Ref  Range Status   Specimen Description URINE, CLEAN CATCH  Final   Special Requests NONE  Final   Culture   Final    1,000 COLONIES/mL INSIGNIFICANT GROWTH Performed at [facility]    Report Status 09/10/2014 FINAL  Final  Blood culture (routine x 2)     Status: None   Collection Time: 09/08/14  3:50 PM  Result Value Ref Range Status   Specimen Description BLOOD LEFT ARM  Final   Special Requests BOTTLES DRAWN AEROBIC AND ANAEROBIC 5CC  Final   Culture  Setup Time   Final    GRAM NEGATIVE RODS IN BOTH AEROBIC AND ANAEROBIC BOTTLES CRITICAL RESULT CALLED TO, READ BACK BY AND VERIFIED WITH: Christel Mormon [id number] [place]    Culture   Final    ESCHERICHIA COLI Performed at [facility]    Report Status 09/11/2014 FINAL  Final   Organism ID, Bacteria ESCHERICHIA COLI  Final      Susceptibility   Escherichia coli - MIC*    AMPICILLIN >=32 RESISTANT Resistant     CEFAZOLIN <=4 SENSITIVE Sensitive     CEFEPIME <=1 SENSITIVE Sensitive     CEFTAZIDIME <=1 SENSITIVE Sensitive     CEFTRIAXONE <=1 SENSITIVE Sensitive     CIPROFLOXACIN <=0.25 SENSITIVE Sensitive     GENTAMICIN <=1 SENSITIVE Sensitive     IMIPENEM <=0.25 SENSITIVE Sensitive     TRIMETH/SULFA <=20 SENSITIVE Sensitive     AMPICILLIN/SULBACTAM 16 INTERMEDIATE Intermediate     PIP/TAZO <=4 SENSITIVE Sensitive     * ESCHERICHIA COLI  Blood culture (routine x 2)     Status: None (Preliminary result)   Collection Time: 09/08/14  6:45 PM  Result Value Ref Range Status   Specimen Description BLOOD RIGHT HAND  Final   Special Requests BOTTLES DRAWN AEROBIC ONLY 5CC  Final   Culture   Final    NO GROWTH 4 DAYS Performed at [facility]    Report Status PENDING  Incomplete  MRSA PCR Screening     Status: None   Collection Time: 09/09/14  1:05 PM  Result Value Ref Range Status   MRSA by PCR NEGATIVE NEGATIVE Final    Comment:        The GeneXpert MRSA Assay (FDA approved for NASAL specimens only),  is one component of a comprehensive MRSA colonization surveillance program. It is not intended to diagnose MRSA infection nor to guide or monitor treatment for MRSA infections.      Studies: No results found.  Scheduled Meds: . antiseptic oral rinse  7 mL Mouth Rinse q12n4p  . cefUROXime  250 mg Oral BID WC  . chlorhexidine  15 mL Mouth Rinse BID  . enoxaparin (LOVENOX) injection  40 mg Subcutaneous Q24H  . feeding  supplement  1 Container Oral TID BM  . gabapentin  300 mg Oral TID  . levothyroxine  75 mcg Oral QAC breakfast  . megestrol  200 mg Oral Daily  . QUEtiapine  300 mg Oral QHS  . sodium chloride  3 mL Intravenous Q12H   Continuous Infusions:   Principal Problem:   Sepsis Active Problems:   Nausea and vomiting in adult   Depression   Hypothyroidism   GAD (generalized anxiety disorder)   Pyelonephritis   E coli bacteremia   Victoria Flores K  [facility] Pager [phone]. If 7PM-7AM, please contact night-coverage at [email or web address], password [email or web address] 09/12/2014, 6:46 PM  LOS: 4 days

## 2014-09-13 LAB — CBC
HCT: 33.1 % — ABNORMAL LOW (ref 36.0–46.0)
Hemoglobin: 11 g/dL — ABNORMAL LOW (ref 12.0–15.0)
MCH: 22 pg — ABNORMAL LOW (ref 26.0–34.0)
MCHC: 33.2 g/dL (ref 30.0–36.0)
MCV: 66.2 fL — AB (ref 78.0–100.0)
PLATELETS: 158 10*3/uL (ref 150–400)
RBC: 5 MIL/uL (ref 3.87–5.11)
RDW: 14.8 % (ref 11.5–15.5)
WBC: 9.8 10*3/uL (ref 4.0–10.5)

## 2014-09-13 LAB — BASIC METABOLIC PANEL
Anion gap: 9 (ref 5–15)
BUN: 6 mg/dL (ref 6–20)
CHLORIDE: 110 mmol/L (ref 101–111)
CO2: 25 mmol/L (ref 22–32)
CREATININE: 1.06 mg/dL — AB (ref 0.44–1.00)
Calcium: 8.9 mg/dL (ref 8.9–10.3)
Glucose, Bld: 106 mg/dL — ABNORMAL HIGH (ref 65–99)
Potassium: 3.6 mmol/L (ref 3.5–5.1)
SODIUM: 144 mmol/L (ref 135–145)

## 2014-09-13 LAB — CULTURE, BLOOD (ROUTINE X 2): CULTURE: NO GROWTH

## 2014-09-13 MED ORDER — KETOROLAC TROMETHAMINE 10 MG PO TABS: 10.0000 mg | ORAL_TABLET | Freq: Four times a day (QID) | ORAL | Status: DC | PRN

## 2014-09-13 MED ORDER — ONDANSETRON HCL 4 MG PO TABS: 4.0000 mg | ORAL_TABLET | Freq: Three times a day (TID) | ORAL | Status: DC | PRN

## 2014-09-13 MED ORDER — OXYCODONE-ACETAMINOPHEN 5-325 MG PO TABS: 1.0000 | ORAL_TABLET | ORAL | Status: DC | PRN

## 2014-09-13 MED ORDER — CEFUROXIME AXETIL 250 MG PO TABS: 250.0000 mg | ORAL_TABLET | Freq: Two times a day (BID) | ORAL | Status: DC

## 2014-09-13 NOTE — Discharge Summary (Addendum)
Physician Discharge Summary  Victoria Flores TMH:[id number] DOB: May 03, 1965 DOA: 09/08/2014  PCP: Philis Fendt, MD  Admit date: 09/08/2014 Discharge date: 09/13/2014  Time spent: 20 minutes  Recommendations for Outpatient Follow-up:  1. Follow up with PCP in 1-2 weeks 2. Follow up with Urology  Discharge Diagnoses:  Principal Problem:   Sepsis Active Problems:   Nausea and vomiting in adult   Depression   Hypothyroidism   GAD (generalized anxiety disorder)   Pyelonephritis   E coli bacteremia   Discharge Condition: Improved  Diet recommendation: Regular  Filed Weights   09/08/14 1329 09/09/14 1420 09/11/14 1055  Weight: 57.607 kg (127 lb) 64.2 kg (141 lb 8.6 oz) 61.7 kg (136 lb 0.4 oz)    History of present illness:  Please see admit h and p from 8/12 for details. Briefly, pt presented with marked flank pain and nausea/vomiting, found to have WBC in the 20k range and pyuria with CT findings of pyelonephritis. The patient was admitted for further work up.  Hospital Course:   Sepsis of urinary source and with pyelonephritis - Patient transferred to SDU on 09/09/14 due to worsening tachycardia, fever and worsening respiratory status.  - sepsis physiology has resolved - Leukocytosis has resolved. Patient remains afebrile - blood cultures speciated E coli resistant only to Ampicillin, narrowed back to Ceftriaxone - Continued to improve overnight thus transitioned to ceftin PO BID - Give trial of toradol for pain. Renal function reviewed and is normal - Patient requested discharge, stating she felt markedly improved since admission, although she still reports back pains. I had called Urology to arrange close follow up appointment and was told that given pt's insurance coverage, she would have to call to schedule herself. Patient states she will call. Contact number and address of [facility] was given to patient.  UTI with pyelonephritis - Urine cultures final, with  insignificant growth   E coli bacteremia - One set, initially she refused a second set, however it was eventually obtained AFTER antibiotics.  - Second set obtained AFTER antibiotics have remained negative, final report pending  Nausea/vomiting - Likely in the setting of #1, supportive treatment, resolving  Hypothyroidism - TSH elevated to 8, continue synthroid as TSH is affected by her acute illness, repeat TSH in 3-4 weeks as an outpatient  GAD/Depression - Patient initially endorsed a set of medications that she is taking, however, Dr. Cruzita Lederer had discussed with pharmacy and she had a different story. Lithium level was low, unlikely she is taking it.  - Continued Seroquel for now  Anemia - In the setting of acute illness, H/H stable  Thrombocytopenia - resolved   Discharge Exam: Filed Vitals:   09/11/14 2234 09/12/14 0415 09/12/14 1504 09/12/14 2100  BP: 118/84 101/76 119/79 131/88  Pulse:  109 112 67  Temp: 98.6 F (37 C) 99 F (37.2 C) 99 F (37.2 C) 98 F (36.7 C)  TempSrc: Oral Oral Oral Oral  Resp: 20 20 20 20   Height:      Weight:      SpO2: 100% 100% 100% 100%    General: Awake, in nad Cardiovascular: regular, s1, s2 Respiratory: normal resp effort, no wheezing  Discharge Instructions     Medication List    STOP taking these medications        loperamide 2 MG capsule  Commonly known as:  IMODIUM     methocarbamol 500 MG tablet  Commonly known as:  ROBAXIN      TAKE these medications  cefUROXime 250 MG tablet  Commonly known as:  CEFTIN  Take 1 tablet (250 mg total) by mouth 2 (two) times daily with a meal.     clonazePAM 2 MG tablet  Commonly known as:  KLONOPIN  Take 2 mg by mouth 2 (two) times daily.     cloNIDine 0.2 MG tablet  Commonly known as:  CATAPRES  Take 1 tablet (0.2 mg total) by mouth 2 (two) times daily.     FLUoxetine 20 MG capsule  Commonly known as:  PROZAC  Take 40 mg by mouth daily.     gabapentin 300 MG  capsule  Commonly known as:  NEURONTIN  Take 300 mg by mouth 3 (three) times daily.     ketorolac 10 MG tablet  Commonly known as:  TORADOL  Take 1 tablet (10 mg total) by mouth every 6 (six) hours as needed.     levothyroxine 75 MCG tablet  Commonly known as:  SYNTHROID, LEVOTHROID  Take 1 tablet (75 mcg total) by mouth daily.     lithium carbonate 450 MG CR tablet  Commonly known as:  ESKALITH  Take 450 mg by mouth at bedtime.     megestrol 40 MG/ML suspension  Commonly known as:  MEGACE  Take 200 mg by mouth daily.     mirtazapine 30 MG tablet  Commonly known as:  REMERON  Take 30 mg by mouth at bedtime.     ondansetron 4 MG tablet  Commonly known as:  ZOFRAN  Take 1 tablet (4 mg total) by mouth every 8 (eight) hours as needed for nausea or vomiting.     oxyCODONE-acetaminophen 5-325 MG per tablet  Commonly known as:  ROXICET  Take 1 tablet by mouth every 4 (four) hours as needed for severe pain.     QUEtiapine 300 MG tablet  Commonly known as:  SEROQUEL  Take 300 mg by mouth at bedtime.     risperidone 4 MG tablet  Commonly known as:  RISPERDAL  Take 1 tablet (4 mg total) by mouth daily.     traMADol 50 MG tablet  Commonly known as:  ULTRAM  Take 50 mg by mouth every 6 (six) hours as needed for moderate pain or severe pain.       Allergies  Allergen Reactions  . Garlic Diarrhea    Vomiting   Follow-up Information    Follow up with AVBUERE,EDWIN A, MD. Schedule an appointment as soon as possible for a visit in 2 days.   Specialty:  Internal Medicine   Contact information:   [place] [address] [phone]       Schedule an appointment as soon as possible for a visit with [facility]   Contact information:   [address]  [place] [address] [place] [address] [phone]        The results of significant diagnostics from this hospitalization (including imaging, microbiology, ancillary and laboratory) are listed  below for reference.    Significant Diagnostic Studies: Ct Abdomen Pelvis W Contrast  09/10/2014   CLINICAL DATA:  Acute onset of bilateral flank pain. Bacteremia and fever. Subsequent encounter.  EXAM: CT ABDOMEN AND PELVIS WITH CONTRAST  TECHNIQUE: Multidetector CT imaging of the abdomen and pelvis was performed using the standard protocol following bolus administration of intravenous contrast.  CONTRAST:  164mL OMNIPAQUE IOHEXOL 300 MG/ML  SOLN  COMPARISON:  CT of the abdomen and pelvis performed 09/08/2014  FINDINGS: Mild bibasilar atelectasis is noted.  The liver and spleen are unremarkable in appearance. The gallbladder is within normal limits. The pancreas and adrenal glands are unremarkable.  Diffusely decreased enhancement is noted within the left kidney, particularly prominent on delayed images, compatible with left-sided pyelonephritis. Left-sided perinephric stranding and fluid is seen, and there is enhancement along the wall of the left ureter, compatible with left-sided ureteritis. No significant hydronephrosis is seen. Soft tissue inflammation tracks inferiorly along the course of the left ureter. The right kidney demonstrates a 4 mm stone near its lower pole. The right kidney is otherwise unremarkable.  No free fluid is identified. The small bowel is unremarkable in appearance. The stomach is within normal limits. No acute vascular abnormalities are seen.  The appendix is normal in caliber, without evidence of appendicitis. The colon is unremarkable in appearance.  The bladder is mildly distended and grossly unremarkable. A small focus of enhancement within the endometrial canal may reflect a small polyp. The uterus is otherwise unremarkable. The ovaries are grossly symmetric. No suspicious adnexal masses are seen. No inguinal lymphadenopathy is seen.  No acute osseous abnormalities are identified.  IMPRESSION: 1. Diffuse left-sided pyelonephritis and ureteritis, with surrounding soft tissue  inflammation. The appearance is worsened from the prior study. No evidence of hydronephrosis. 2. Small focus of enhancement within the endometrial canal may reflect a small endometrial polyp. Pelvic ultrasound could be considered for further evaluation, when and as deemed clinically appropriate. 3. Mild bibasilar atelectasis noted.   Electronically Signed   By: Garald Balding M.D.   On: 09/10/2014 04:08   Dg Chest Port 1 View  09/09/2014   CLINICAL DATA:  49 year old female with shortness of breath today. Initial encounter.  EXAM: PORTABLE CHEST - 1 VIEW  COMPARISON:  09/28/2013 and earlier.  FINDINGS: Portable AP semi upright view at 1625 hrs. Lower lung volumes. Stable to mildly increased cardiac silhouette. Other mediastinal contours are within normal limits. Diffusely increased pulmonary interstitial opacity is new. No pneumothorax, pleural effusion or consolidation. Visualized tracheal air column is within normal limits.  IMPRESSION: Lower lung volumes with diffuse interstitial opacity and mildly increased cardiac silhouette.  Main differential considerations are acute pulmonary edema and viral/atypical pneumonia. No pleural effusion identified.   Electronically Signed   By: Genevie Ann M.D.   On: 09/09/2014 16:37   Ct Renal Stone Study  09/08/2014   CLINICAL DATA:  RIGHT flank pain, nausea, vomiting, history kidney stones  EXAM: CT ABDOMEN AND PELVIS WITHOUT CONTRAST  TECHNIQUE: Multidetector CT imaging of the abdomen and pelvis was performed following the standard protocol without IV contrast. Sagittal and coronal MPR images reconstructed from axial data set. Oral contrast not administered for this indication.  COMPARISON:  09/28/2013  FINDINGS: Lung bases clear.  Nonobstructing RIGHT renal calculi largest 4 mm diameter inferior pole.  Mild LEFT perinephric and peripelvic edema which can be seen with stone disease and infection.  Minimal prominence of the LEFT renal collecting system and proximal LEFT  ureter with decompressed distal ureter.  No discrete point of obstruction or ureteral calcification identified.  Liver, spleen, pancreas, and adrenal glands unremarkable.  Normal appearing gallbladder and appendix by CT.  Bladder, uterus and adnexa normal appearance.  Stomach and bowel loops grossly normal for technique.  No mass, adenopathy, free air or free fluid.  Osseous structures unremarkable.  IMPRESSION: Nonobstructing small RIGHT renal calculi.  LEFT perinephric and peripelvic edema with minimal prominence of LEFT collecting system and proximal LEFT ureter though no discrete ureteral calculus is visualized.  This could represent a passed stone or pyelonephritis and correlation urinalysis is recommended.  These findings are on the opposite side from the RIGHT flank symptoms provided as history for this exam, recommend clinical correlation.   Electronically Signed   By: Lavonia Dana M.D.   On: 09/08/2014 14:52    Microbiology: Recent Results (from the past 240 hour(s))  Culture, Urine     Status: None   Collection Time: 09/08/14  3:49 PM  Result Value Ref Range Status   Specimen Description URINE, CLEAN CATCH  Final   Special Requests NONE  Final   Culture   Final    1,000 COLONIES/mL INSIGNIFICANT GROWTH Performed at [facility]    Report Status 09/10/2014 FINAL  Final  Blood culture (routine x 2)     Status: None   Collection Time: 09/08/14  3:50 PM  Result Value Ref Range Status   Specimen Description BLOOD LEFT ARM  Final   Special Requests BOTTLES DRAWN AEROBIC AND ANAEROBIC 5CC  Final   Culture  Setup Time   Final    GRAM NEGATIVE RODS IN BOTH AEROBIC AND ANAEROBIC BOTTLES CRITICAL RESULT CALLED TO, READ BACK BY AND VERIFIED WITH: Christel Mormon [id number] [place]    Culture   Final    ESCHERICHIA COLI Performed at [facility]    Report Status 09/11/2014 FINAL  Final   Organism ID, Bacteria ESCHERICHIA COLI  Final      Susceptibility   Escherichia coli -  MIC*    AMPICILLIN >=32 RESISTANT Resistant     CEFAZOLIN <=4 SENSITIVE Sensitive     CEFEPIME <=1 SENSITIVE Sensitive     CEFTAZIDIME <=1 SENSITIVE Sensitive     CEFTRIAXONE <=1 SENSITIVE Sensitive     CIPROFLOXACIN <=0.25 SENSITIVE Sensitive     GENTAMICIN <=1 SENSITIVE Sensitive     IMIPENEM <=0.25 SENSITIVE Sensitive     TRIMETH/SULFA <=20 SENSITIVE Sensitive     AMPICILLIN/SULBACTAM 16 INTERMEDIATE Intermediate     PIP/TAZO <=4 SENSITIVE Sensitive     * ESCHERICHIA COLI  Blood culture (routine x 2)     Status: None   Collection Time: 09/08/14  6:45 PM  Result Value Ref Range Status   Specimen Description BLOOD RIGHT HAND  Final   Special Requests BOTTLES DRAWN AEROBIC ONLY 5CC  Final   Culture   Final    NO GROWTH 5 DAYS Performed at [facility]    Report Status 09/13/2014 FINAL  Final  MRSA PCR Screening     Status: None   Collection Time: 09/09/14  1:05 PM  Result Value Ref Range Status   MRSA by PCR NEGATIVE NEGATIVE Final    Comment:        The GeneXpert MRSA Assay (FDA approved for NASAL specimens only), is one component of a comprehensive MRSA colonization surveillance program. It is not intended to diagnose MRSA infection nor to guide or monitor treatment for MRSA infections.      Labs: Basic Metabolic Panel:  Recent Labs Lab 09/08/14 1459 09/09/14 0530 09/10/14 0335 09/11/14 0239 09/13/14 0543  NA 139 141 143 142 144  K 3.6 3.9 3.4* 3.5 3.6  CL 105 112* 116* 114* 110  CO2 22 23 21* 20* 25  GLUCOSE 132* 103* 108* 101* 106*  BUN 10 9 9 8 6   CREATININE 1.03* 0.98 1.11* 1.08* 1.06*  CALCIUM 9.0 7.8* 8.5* 8.9 8.9   Liver Function Tests:  Recent Labs Lab 09/10/14 0335  AST 13*  ALT 11*  ALKPHOS 77  BILITOT 0.6  PROT 6.2*  ALBUMIN 2.8*   No results for input(s): LIPASE, AMYLASE in the last 168 hours. No results for input(s): AMMONIA in the last 168 hours. CBC:  Recent Labs Lab 09/08/14 1459 09/09/14 0530 09/10/14 0335  09/11/14 0239 09/13/14 0543  WBC 20.8* 18.5* 10.1 7.3 9.8  NEUTROABS 18.4*  --   --   --   --   HGB 12.1 9.6* 10.3* 10.2* 11.0*  HCT 37.0 30.3* 30.4* 30.6* 33.1*  MCV 66.8* 66.7* 66.5* 65.5* 66.2*  PLT 182 140* 132* 152 158   Cardiac Enzymes: No results for input(s): CKTOTAL, CKMB, CKMBINDEX, TROPONINI in the last 168 hours. BNP: BNP (last 3 results) No results for input(s): BNP in the last 8760 hours.  ProBNP (last 3 results) No results for input(s): PROBNP in the last 8760 hours.  CBG: No results for input(s): GLUCAP in the last 168 hours.   Signed:  Jaice Digioia, Orpah Melter  [facility] 09/13/2014, 6:21 PM

## 2014-09-13 NOTE — Care Management Note (Signed)
Case Management Note  Patient Details  Name: Janeshia Ciliberto MRN: [id number] Date of Birth: 07-28-1965  Subjective/Objective:                    Action/Plan:d/c home no needs or orders.   Expected Discharge Date:   (unknown)               Expected Discharge Plan:  Home/Self Care  In-House Referral:  NA  Discharge planning Services  CM Consult  Post Acute Care Choice:  NA Choice offered to:  NA  DME Arranged:  N/A DME Agency:  NA  HH Arranged:  NA HH Agency:  NA  Status of Service:  Completed, signed off  Medicare Important Message Given:  Yes-second notification given Date Medicare IM Given:    Medicare IM give by:    Date Additional Medicare IM Given:    Additional Medicare Important Message give by:     If discussed at [place] of Stay Meetings, dates discussed:    Additional Comments:  Dessa Phi, RN 09/13/2014, 12:21 PM

## 2014-10-14 IMAGING — CT CT ABD-PELV W/ CM
1 of 3 series · 14 of 32 positions shown, 19 images · IV contrast (OMNIPAQUE 300)
Comparison: 04/05/2012.

CLINICAL DATA: Abdominal pain.  Nausea vomiting and diarrhea for 4
days.

CT ABDOMEN AND PELVIS WITH CONTRAST
TECHNIQUE: Multidetector CT imaging of the abdomen and pelvis was
performed following the standard protocol during bolus
administration of intravenous contrast.
Contrast: 50mL OMNIPAQUE IOHEXOL 300 MG/ML  SOLN, 100mL OMNIPAQUE
IOHEXOL 300 MG/ML  SOLN

[Series 2: abd/pel with · axial · 0.77mm/px · z∈[+1008,+1354]mm · 14 of 79 slices shown, 19 images]
[im 5/79  soft-tissue]
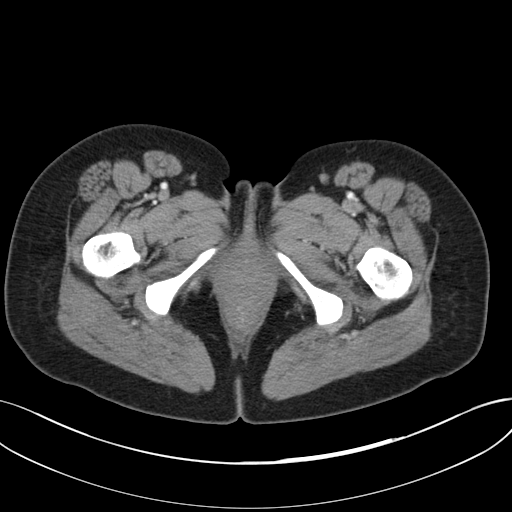
[im 5/79  bone]
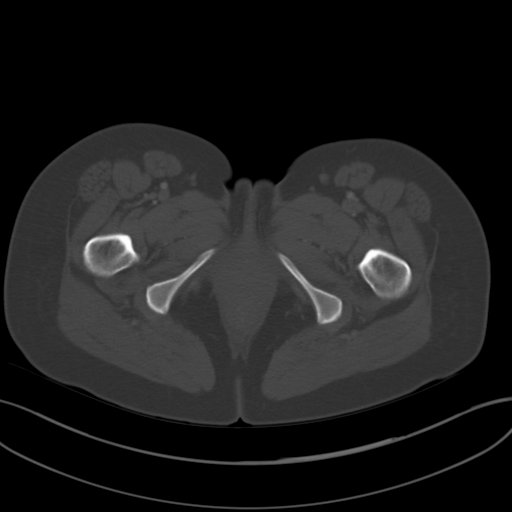
[im 9/79  soft-tissue]
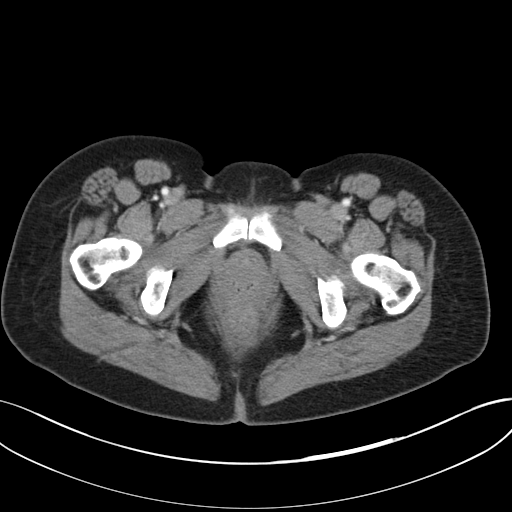
[im 18/79  soft-tissue]
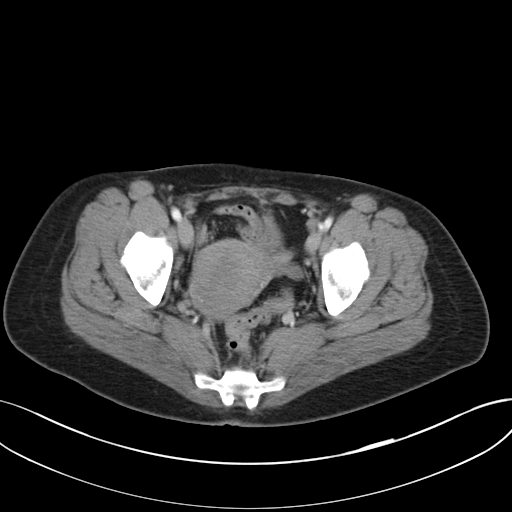
[im 22/79  soft-tissue]
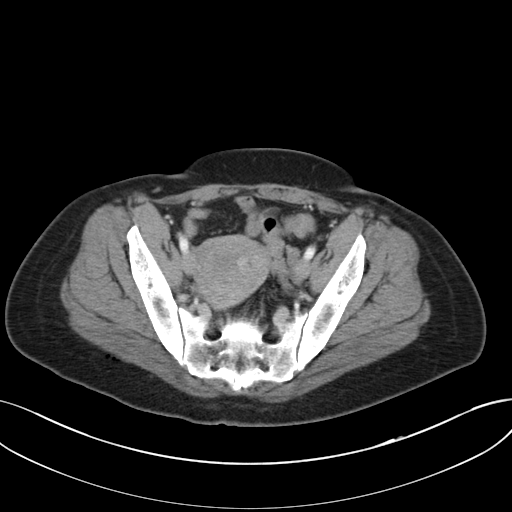
[im 27/79  soft-tissue]
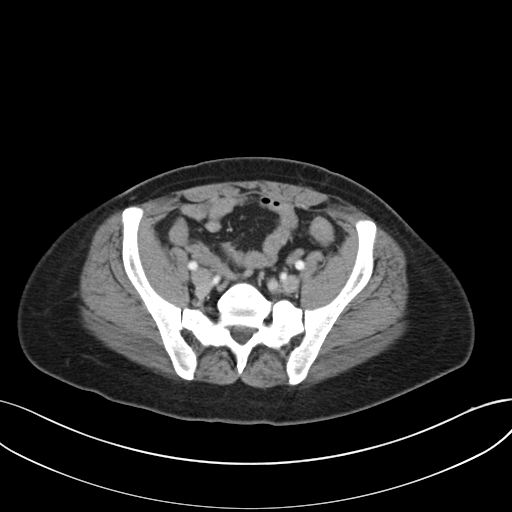
[im 35/79  soft-tissue]
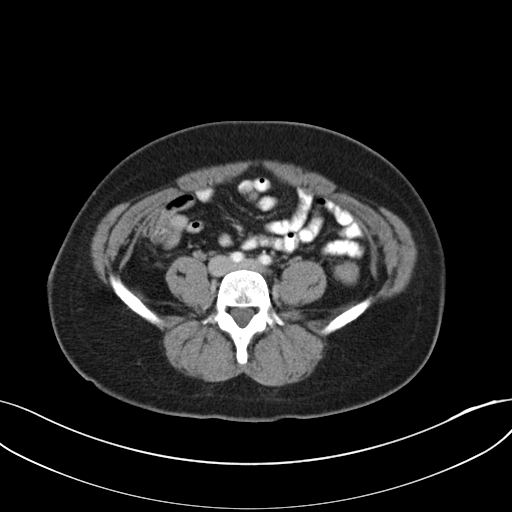
[im 40/79  soft-tissue]
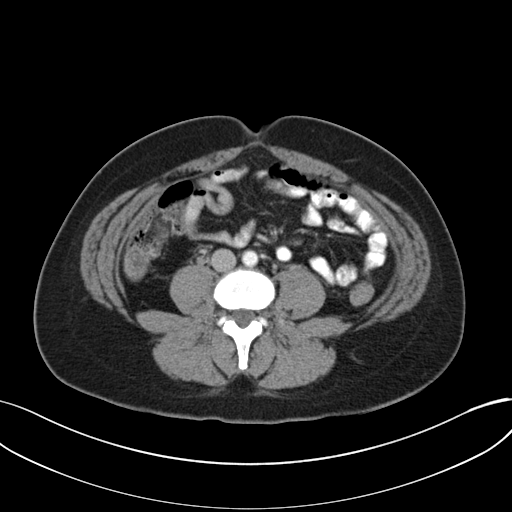
[im 44/79  soft-tissue]
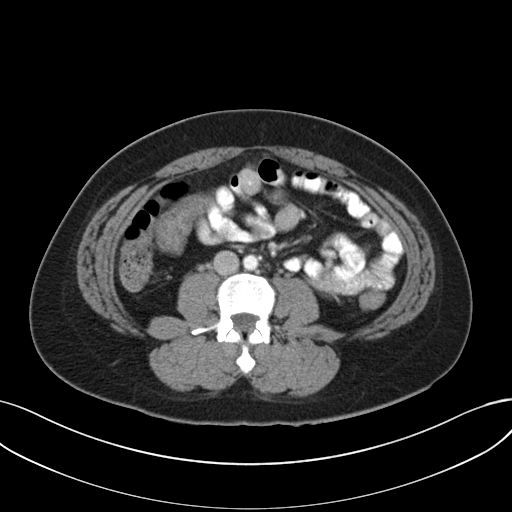
[im 53/79  soft-tissue]
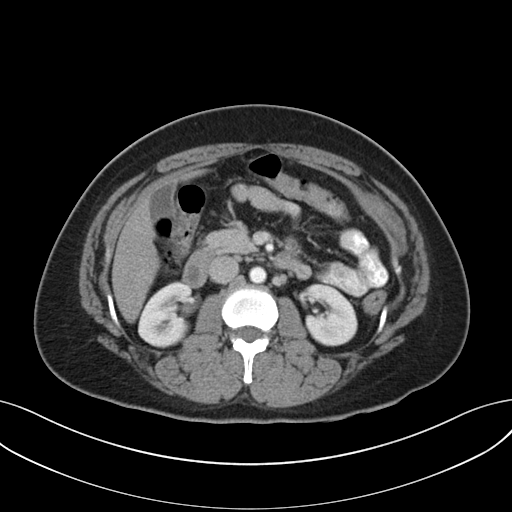
[im 53/79  bone]
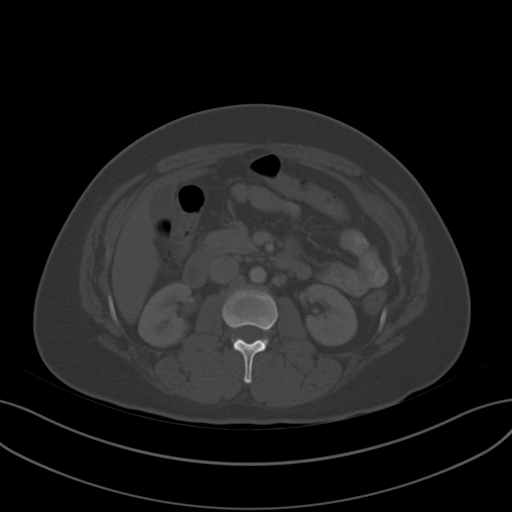
[im 57/79  soft-tissue]
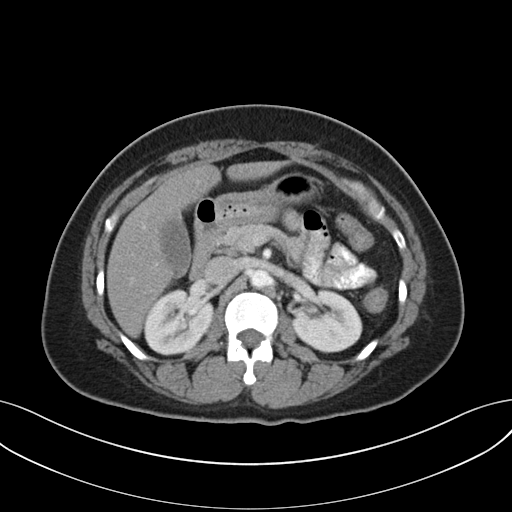
[im 61/79  soft-tissue]
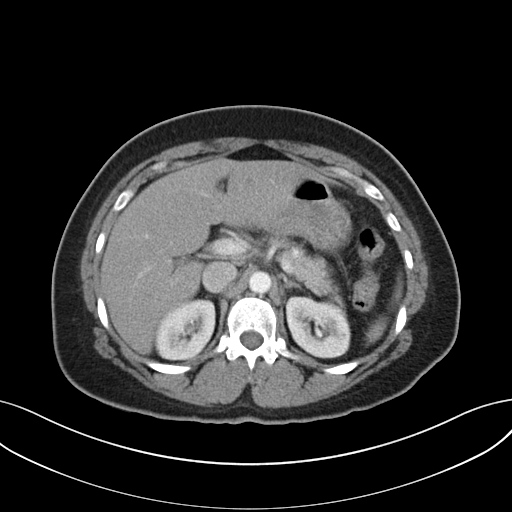
[im 61/79  lung]
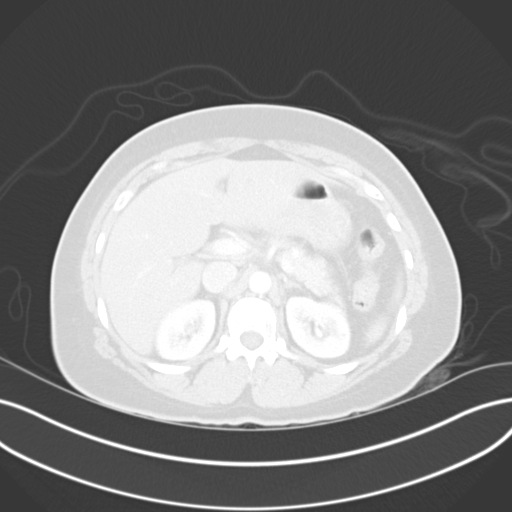
[im 66/79  lung]
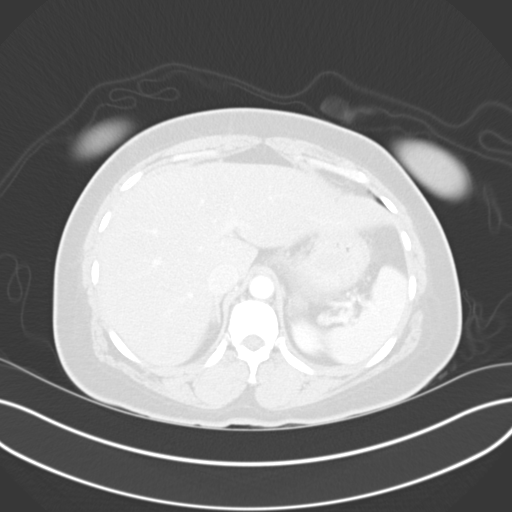
[im 70/79  soft-tissue]
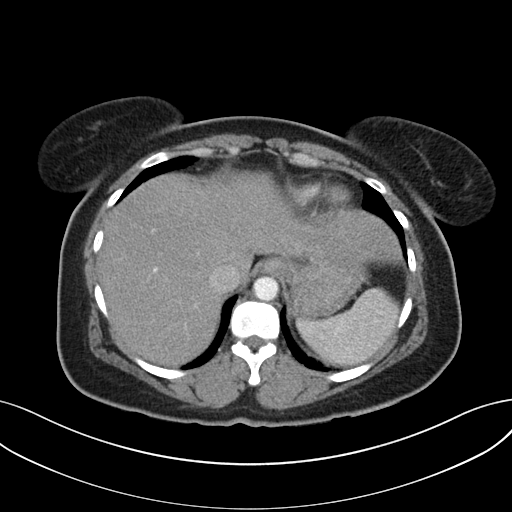
[im 70/79  lung]
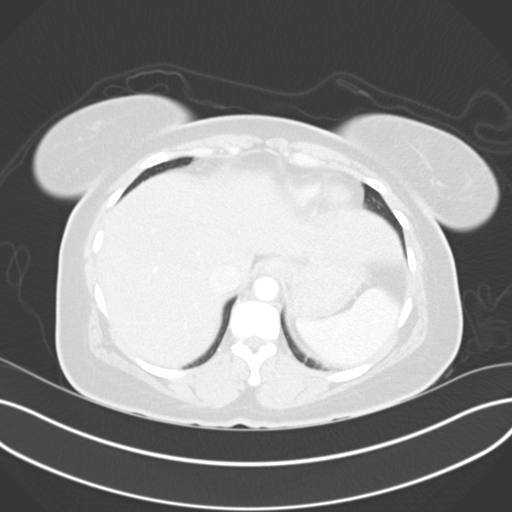
[im 74/79  soft-tissue]
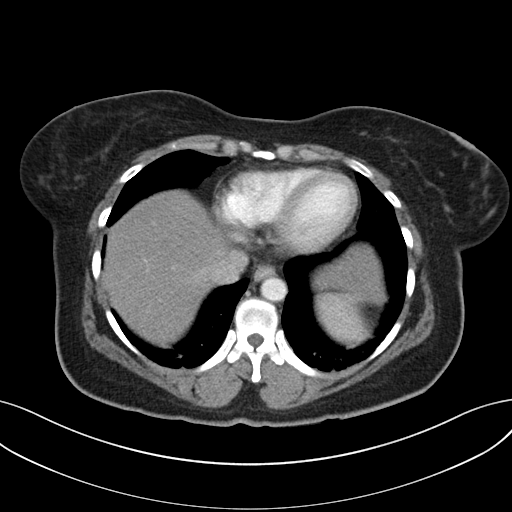
[im 74/79  lung]
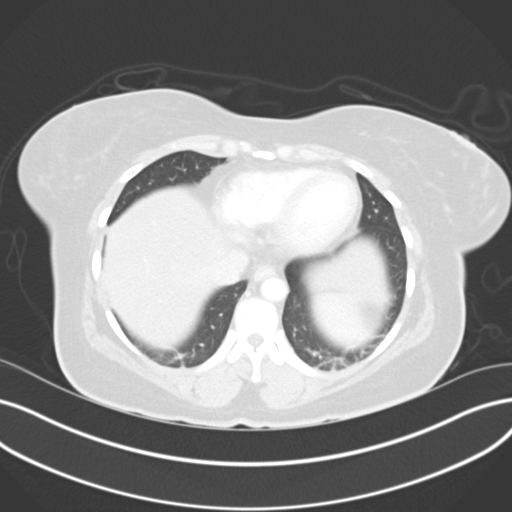

[14 of 32 positions shown; findings below may reference images not displayed]

FINDINGS: Basilar subsegmental atelectasis.

Portions of the bowel are under distended and evaluation limited
however, there is no evidence of extraluminal bowel inflammatory
process, free fluid or free air.  Specifically, no inflammation
surrounds the elongated appendix.

Uterine fibroids.  No worrisome adnexal mass.

Fatty infiltration of the liver without focal worrisome mass.

No calcified gallstones.

Nonobstructing left lower pole 6 mm renal calculi.  Left upper pole
6 mm low density structure possibly a renal cyst although too small
to characterize.

No worrisome splenic, pancreatic or adrenal lesion.

No abdominal aortic aneurysm.  No bony destructive lesion.

No hernia detected.
IMPRESSION: Portions of the bowel are under distended and evaluation limited
however, there is no evidence of extraluminal bowel inflammatory
process, free fluid or free air.  Specifically, no inflammation
surrounds the elongated appendix.

Uterine fibroids.

Fatty infiltration of the liver.

Nonobstructing left lower pole 6 mm renal calculi.

## 2015-02-14 ENCOUNTER — Other Ambulatory Visit: Payer: Self-pay | Admitting: Internal Medicine

## 2015-02-14 DIAGNOSIS — Z1239 Encounter for other screening for malignant neoplasm of breast: Secondary | ICD-10-CM

## 2015-02-21 ENCOUNTER — Other Ambulatory Visit: Payer: Self-pay | Admitting: Internal Medicine

## 2015-02-21 DIAGNOSIS — Z1231 Encounter for screening mammogram for malignant neoplasm of breast: Secondary | ICD-10-CM

## 2015-02-21 DIAGNOSIS — Z1239 Encounter for other screening for malignant neoplasm of breast: Secondary | ICD-10-CM

## 2015-03-01 ENCOUNTER — Ambulatory Visit
Admission: RE | Admit: 2015-03-01 | Discharge: 2015-03-01 | Disposition: A | Discharge status: Home / Self Care | Payer: Medicare Other | Source: Ambulatory Visit | Attending: Internal Medicine | Admitting: Internal Medicine

## 2015-03-01 DIAGNOSIS — Z1231 Encounter for screening mammogram for malignant neoplasm of breast: Secondary | ICD-10-CM

## 2015-03-01 DIAGNOSIS — Z1239 Encounter for other screening for malignant neoplasm of breast: Secondary | ICD-10-CM

## 2015-07-09 ENCOUNTER — Ambulatory Visit (HOSPITAL_BASED_OUTPATIENT_CLINIC_OR_DEPARTMENT_OTHER): Payer: Self-pay

## 2015-08-10 ENCOUNTER — Encounter (HOSPITAL_COMMUNITY): Payer: Self-pay | Admitting: Oncology

## 2015-08-10 ENCOUNTER — Observation Stay (HOSPITAL_COMMUNITY)
Admission: EM | Admit: 2015-08-10 | Discharge: 2015-08-12 | Disposition: A | Discharge status: Home / Self Care | Payer: Medicare Other | Attending: Internal Medicine | Admitting: Internal Medicine

## 2015-08-10 DIAGNOSIS — Z791 Long term (current) use of non-steroidal anti-inflammatories (NSAID): Secondary | ICD-10-CM | POA: Insufficient documentation

## 2015-08-10 DIAGNOSIS — N390 Urinary tract infection, site not specified: Secondary | ICD-10-CM | POA: Diagnosis not present

## 2015-08-10 DIAGNOSIS — F329 Major depressive disorder, single episode, unspecified: Secondary | ICD-10-CM | POA: Insufficient documentation

## 2015-08-10 DIAGNOSIS — M1990 Unspecified osteoarthritis, unspecified site: Secondary | ICD-10-CM | POA: Diagnosis not present

## 2015-08-10 DIAGNOSIS — Z79899 Other long term (current) drug therapy: Secondary | ICD-10-CM | POA: Diagnosis not present

## 2015-08-10 DIAGNOSIS — M545 Low back pain, unspecified: Secondary | ICD-10-CM | POA: Diagnosis present

## 2015-08-10 DIAGNOSIS — N3000 Acute cystitis without hematuria: Secondary | ICD-10-CM | POA: Diagnosis present

## 2015-08-10 DIAGNOSIS — N10 Acute tubulo-interstitial nephritis: Secondary | ICD-10-CM

## 2015-08-10 DIAGNOSIS — F17200 Nicotine dependence, unspecified, uncomplicated: Secondary | ICD-10-CM | POA: Diagnosis not present

## 2015-08-10 DIAGNOSIS — E039 Hypothyroidism, unspecified: Secondary | ICD-10-CM | POA: Diagnosis not present

## 2015-08-10 DIAGNOSIS — R112 Nausea with vomiting, unspecified: Secondary | ICD-10-CM | POA: Diagnosis present

## 2015-08-10 DIAGNOSIS — R109 Unspecified abdominal pain: Secondary | ICD-10-CM | POA: Diagnosis present

## 2015-08-10 LAB — BASIC METABOLIC PANEL
ANION GAP: 9 (ref 5–15)
BUN: 6 mg/dL (ref 6–20)
CHLORIDE: 109 mmol/L (ref 101–111)
CO2: 22 mmol/L (ref 22–32)
Calcium: 9.4 mg/dL (ref 8.9–10.3)
Creatinine, Ser: 0.89 mg/dL (ref 0.44–1.00)
GFR calc Af Amer: >60 mL/min (ref >60)
GLUCOSE: 144 mg/dL — AB (ref 65–99)
POTASSIUM: 2.9 mmol/L — AB (ref 3.5–5.1)
Sodium: 140 mmol/L (ref 135–145)

## 2015-08-10 NOTE — ED Notes (Signed)
Pt c/o right flank pain x 1 week.  +nausea/vomiting.  Pt states she has a kidney infection and her medicine she got from her PCP is not working.  Pt rates pain 10/10, sharp and stabbing in nature.

## 2015-08-11 ENCOUNTER — Emergency Department (HOSPITAL_COMMUNITY): Payer: Medicare Other

## 2015-08-11 DIAGNOSIS — N390 Urinary tract infection, site not specified: Secondary | ICD-10-CM | POA: Diagnosis not present

## 2015-08-11 LAB — URINE MICROSCOPIC-ADD ON: RBC / HPF: NONE SEEN RBC/hpf (ref 0–5)

## 2015-08-11 LAB — URINALYSIS, ROUTINE W REFLEX MICROSCOPIC
Glucose, UA: NEGATIVE mg/dL
KETONES UR: 40 mg/dL — AB
NITRITE: POSITIVE — AB
PH: 6 (ref 5.0–8.0)
PROTEIN: NEGATIVE mg/dL
Specific Gravity, Urine: 1.017 (ref 1.005–1.030)

## 2015-08-11 LAB — CBC
HCT: 43.2 % (ref 36.0–46.0)
HEMATOCRIT: 42.6 % (ref 36.0–46.0)
HEMOGLOBIN: 14.7 g/dL (ref 12.0–15.0)
Hemoglobin: 14.8 g/dL (ref 12.0–15.0)
MCH: 21.8 pg — AB (ref 26.0–34.0)
MCH: 22.2 pg — ABNORMAL LOW (ref 26.0–34.0)
MCHC: 34.3 g/dL (ref 30.0–36.0)
MCHC: 34.5 g/dL (ref 30.0–36.0)
MCV: 63.7 fL — ABNORMAL LOW (ref 78.0–100.0)
MCV: 64.3 fL — AB (ref 78.0–100.0)
PLATELETS: 202 10*3/uL (ref 150–400)
Platelets: 20 10*3/uL — CL (ref 150–400)
RBC: 6.63 MIL/uL — ABNORMAL HIGH (ref 3.87–5.11)
RBC: 6.78 MIL/uL — ABNORMAL HIGH (ref 3.87–5.11)
RDW: 15 % (ref 11.5–15.5)
RDW: 15 % (ref 11.5–15.5)
WBC: 7 10*3/uL (ref 4.0–10.5)
WBC: 9.2 10*3/uL (ref 4.0–10.5)

## 2015-08-11 LAB — POTASSIUM: POTASSIUM: 4 mmol/L (ref 3.5–5.1)

## 2015-08-11 LAB — PREGNANCY, URINE: PREG TEST UR: NEGATIVE

## 2015-08-11 MED ORDER — ENOXAPARIN SODIUM 40 MG/0.4ML ~~LOC~~ SOLN
40.0000 mg | SUBCUTANEOUS | Status: DC
  Administered 2015-08-11: 40 mg via SUBCUTANEOUS
  Filled 2015-08-11 (×2): qty 0.4

## 2015-08-11 MED ORDER — POTASSIUM CHLORIDE 10 MEQ/100ML IV SOLN: 10.0000 meq | Freq: Once | INTRAVENOUS | Status: DC

## 2015-08-11 MED ORDER — LEVOTHYROXINE SODIUM 75 MCG PO TABS
75.0000 ug | ORAL_TABLET | Freq: Every day | ORAL | Status: DC
  Administered 2015-08-11 – 2015-08-12 (×2): 75 ug via ORAL
  Filled 2015-08-11: qty 3
  Filled 2015-08-11 (×2): qty 1
  Filled 2015-08-11: qty 3

## 2015-08-11 MED ORDER — METOCLOPRAMIDE HCL 5 MG/ML IJ SOLN
10.0000 mg | Freq: Once | INTRAMUSCULAR | Status: AC
  Administered 2015-08-11: 10 mg via INTRAVENOUS
  Filled 2015-08-11: qty 2

## 2015-08-11 MED ORDER — BUPRENORPHINE HCL-NALOXONE HCL 8-2 MG SL FILM: 1.0000 | ORAL_FILM | Freq: Two times a day (BID) | SUBLINGUAL | Status: DC

## 2015-08-11 MED ORDER — SODIUM CHLORIDE 0.9 % IV SOLN
INTRAVENOUS | Status: AC
  Administered 2015-08-11 – 2015-08-12 (×2): via INTRAVENOUS

## 2015-08-11 MED ORDER — HYDROMORPHONE HCL 1 MG/ML IJ SOLN
1.0000 mg | Freq: Once | INTRAMUSCULAR | Status: AC
  Administered 2015-08-11: 1 mg via INTRAVENOUS
  Filled 2015-08-11: qty 1

## 2015-08-11 MED ORDER — SODIUM CHLORIDE 0.9 % IV BOLUS (SEPSIS)
500.0000 mL | Freq: Once | INTRAVENOUS | Status: AC
  Administered 2015-08-11: 500 mL via INTRAVENOUS

## 2015-08-11 MED ORDER — DULOXETINE HCL 30 MG PO CPEP
30.0000 mg | ORAL_CAPSULE | Freq: Every evening | ORAL | Status: DC
  Filled 2015-08-11 (×2): qty 1

## 2015-08-11 MED ORDER — KETOROLAC TROMETHAMINE 30 MG/ML IJ SOLN
30.0000 mg | Freq: Four times a day (QID) | INTRAMUSCULAR | Status: DC | PRN
  Administered 2015-08-11: 30 mg via INTRAVENOUS
  Filled 2015-08-11: qty 1

## 2015-08-11 MED ORDER — ALBUTEROL SULFATE (2.5 MG/3ML) 0.083% IN NEBU: 3.0000 mL | INHALATION_SOLUTION | Freq: Four times a day (QID) | RESPIRATORY_TRACT | Status: DC | PRN

## 2015-08-11 MED ORDER — MEGESTROL ACETATE 40 MG/ML PO SUSP
200.0000 mg | Freq: Every day | ORAL | Status: DC
  Administered 2015-08-11 – 2015-08-12 (×2): 200 mg via ORAL
  Filled 2015-08-11 (×2): qty 5

## 2015-08-11 MED ORDER — MORPHINE SULFATE (PF) 4 MG/ML IV SOLN
8.0000 mg | Freq: Once | INTRAVENOUS | Status: AC
  Administered 2015-08-11: 8 mg via INTRAVENOUS
  Filled 2015-08-11: qty 2

## 2015-08-11 MED ORDER — FLUOXETINE HCL 20 MG PO CAPS
40.0000 mg | ORAL_CAPSULE | Freq: Every day | ORAL | Status: DC
  Administered 2015-08-11: 40 mg via ORAL
  Filled 2015-08-11 (×2): qty 2

## 2015-08-11 MED ORDER — POTASSIUM CHLORIDE CRYS ER 20 MEQ PO TBCR
40.0000 meq | EXTENDED_RELEASE_TABLET | Freq: Once | ORAL | Status: DC
  Filled 2015-08-11: qty 2

## 2015-08-11 MED ORDER — KETOROLAC TROMETHAMINE 30 MG/ML IJ SOLN: 30.0000 mg | Freq: Once | INTRAMUSCULAR | Status: DC

## 2015-08-11 MED ORDER — PANTOPRAZOLE SODIUM 40 MG PO TBEC
40.0000 mg | DELAYED_RELEASE_TABLET | Freq: Every day | ORAL | Status: DC
  Administered 2015-08-11 – 2015-08-12 (×2): 40 mg via ORAL
  Filled 2015-08-11 (×2): qty 1

## 2015-08-11 MED ORDER — ONDANSETRON HCL 4 MG/2ML IJ SOLN
4.0000 mg | Freq: Once | INTRAMUSCULAR | Status: AC
  Administered 2015-08-11: 4 mg via INTRAVENOUS
  Filled 2015-08-11: qty 2

## 2015-08-11 MED ORDER — GABAPENTIN 300 MG PO CAPS
300.0000 mg | ORAL_CAPSULE | Freq: Three times a day (TID) | ORAL | Status: DC
  Administered 2015-08-11 (×2): 300 mg via ORAL
  Filled 2015-08-11 (×3): qty 1

## 2015-08-11 MED ORDER — DEXTROSE 5 % IV SOLN
1.0000 g | Freq: Once | INTRAVENOUS | Status: AC
  Administered 2015-08-11: 1 g via INTRAVENOUS
  Filled 2015-08-11: qty 10

## 2015-08-11 MED ORDER — HYDROXYZINE PAMOATE 50 MG PO CAPS
50.0000 mg | ORAL_CAPSULE | Freq: Every day | ORAL | Status: DC
  Administered 2015-08-11: 50 mg via ORAL
  Filled 2015-08-11: qty 1

## 2015-08-11 MED ORDER — ONDANSETRON HCL 4 MG/2ML IJ SOLN
4.0000 mg | Freq: Once | INTRAMUSCULAR | Status: DC
  Filled 2015-08-11: qty 2

## 2015-08-11 MED ORDER — QUETIAPINE FUMARATE 300 MG PO TABS
800.0000 mg | ORAL_TABLET | Freq: Every day | ORAL | Status: DC
  Administered 2015-08-11: 800 mg via ORAL
  Filled 2015-08-11: qty 2

## 2015-08-11 MED ORDER — ONDANSETRON HCL 4 MG/2ML IJ SOLN
4.0000 mg | Freq: Four times a day (QID) | INTRAMUSCULAR | Status: DC | PRN
  Administered 2015-08-11 (×2): 4 mg via INTRAVENOUS
  Filled 2015-08-11: qty 2

## 2015-08-11 MED ORDER — PROMETHAZINE HCL 25 MG/ML IJ SOLN
25.0000 mg | Freq: Once | INTRAMUSCULAR | Status: AC
  Administered 2015-08-11: 25 mg via INTRAVENOUS
  Filled 2015-08-11: qty 1

## 2015-08-11 MED ORDER — CLONAZEPAM 1 MG PO TABS
2.0000 mg | ORAL_TABLET | Freq: Two times a day (BID) | ORAL | Status: DC
  Administered 2015-08-11 – 2015-08-12 (×3): 2 mg via ORAL
  Filled 2015-08-11 (×3): qty 2

## 2015-08-11 MED ORDER — BUPRENORPHINE HCL 8 MG SL SUBL
8.0000 mg | SUBLINGUAL_TABLET | Freq: Two times a day (BID) | SUBLINGUAL | Status: DC
  Administered 2015-08-12: 8 mg via SUBLINGUAL
  Filled 2015-08-11 (×2): qty 1

## 2015-08-11 MED ORDER — DEXTROSE 5 % IV SOLN
1.0000 g | INTRAVENOUS | Status: DC
  Administered 2015-08-12: 1 g via INTRAVENOUS
  Filled 2015-08-11: qty 10

## 2015-08-11 MED ORDER — LITHIUM CARBONATE ER 450 MG PO TBCR
450.0000 mg | EXTENDED_RELEASE_TABLET | Freq: Every day | ORAL | Status: DC
  Administered 2015-08-11: 450 mg via ORAL
  Filled 2015-08-11: qty 1

## 2015-08-11 MED ORDER — ONDANSETRON HCL 4 MG PO TABS: 4.0000 mg | ORAL_TABLET | Freq: Four times a day (QID) | ORAL | Status: DC | PRN

## 2015-08-11 MED ORDER — BUPROPION HCL ER (XL) 150 MG PO TB24
150.0000 mg | ORAL_TABLET | Freq: Every day | ORAL | Status: DC
  Administered 2015-08-11: 150 mg via ORAL
  Filled 2015-08-11 (×2): qty 1

## 2015-08-11 MED ORDER — KETOROLAC TROMETHAMINE 30 MG/ML IJ SOLN
30.0000 mg | Freq: Four times a day (QID) | INTRAMUSCULAR | Status: DC
  Administered 2015-08-11 – 2015-08-12 (×4): 30 mg via INTRAVENOUS
  Filled 2015-08-11 (×4): qty 1

## 2015-08-11 MED ORDER — ZOLPIDEM TARTRATE 5 MG PO TABS: 5.0000 mg | ORAL_TABLET | Freq: Every evening | ORAL | Status: DC | PRN

## 2015-08-11 NOTE — ED Notes (Addendum)
Patient yelling and screaming for pain medication-Dr. Tamera Punt made aware

## 2015-08-11 NOTE — ED Notes (Signed)
Pt c/o R flank pain radiating to abdomen, pt states she was dx with kidney infection 2 days ago by PCP, pt states medication not working, pt states this was not antbx and was not given anything for pain. She is unsure of the medication. Pt refusing to walk to BR for urine sample and is very difficult historian, yelling "please help me" and intermittently appears to fall asleep.

## 2015-08-11 NOTE — ED Notes (Signed)
Attempted to call report, floor unaware they were getting an admission, she will return call.

## 2015-08-11 NOTE — ED Provider Notes (Signed)
CSN: [id number]     Arrival date & time 08/10/15  2125 History   By signing my name below, I, Maud Deed. Royston Sinner, attest that this documentation has been prepared under the direction and in the presence of Malvin Johns, MD.  Electronically Signed: Maud Deed. Royston Sinner, ED Scribe. 08/11/2015. 12:37 AM.   Chief Complaint  Patient presents with  . Flank Pain   The history is provided by the patient. No language interpreter was used.    HPI Comments: Victoria Flores is a 50 y.o. female with a PMHx of kidney stones x 2 and sickle cell trait who presents to the Emergency Department complaining of constant R sided flank pain that radiates to her lower abdomen x 1 week; worsened in the last 2 days. Pain is described sharp/stabbing and currently rated 10/10. No aggravating or alleviating factors at this time. She also reports associated nausea, vomiting, chills, and 1 episode of diarrhea. No OTC medications or home remedies attempted prior to arrival. No recent fever. Pt was evaluated by her PCP 2 days ago and was diagnosed with a kidney infection. Pt states she was started on medication but is unsure of the name. Denies being placed on an antibiotics for infection. No surgical procedures required for previous kidney stones.  PCP: Philis Fendt, MD    Past Medical History  Diagnosis Date  . Arthritis   . Prolapsed disk   . Heart murmur   . Sleep deprivation   . Depression   . Sickle cell trait ([place])   . Hypothyroid   . Anxiety   . Renal disorder    Past Surgical History  Procedure Laterality Date  . Cesarean section     Family History  Problem Relation Age of Onset  . Breast cancer Sister   . Diabetes Brother    Social History  Substance Use Topics  . Smoking status: Current Some Day Smoker -- 0.50 packs/day for 21 years  . Smokeless tobacco: None  . Alcohol Use: No   OB History    No data available     Review of Systems  Constitutional: Positive for chills. Negative for fever,  diaphoresis and fatigue.  HENT: Negative for congestion, rhinorrhea and sneezing.   Eyes: Negative.   Respiratory: Negative for cough, chest tightness and shortness of breath.   Cardiovascular: Negative for chest pain and leg swelling.  Gastrointestinal: Positive for nausea and vomiting. Negative for abdominal pain, diarrhea and blood in stool.  Genitourinary: Positive for flank pain. Negative for frequency, hematuria and difficulty urinating.  Musculoskeletal: Negative for back pain and arthralgias.  Skin: Negative for rash.  Neurological: Negative for dizziness, speech difficulty, weakness, numbness and headaches.      Allergies  Garlic  Home Medications   Prior to Admission medications   Medication Sig Start Date End Date Taking? Authorizing Provider  buPROPion (WELLBUTRIN XL) 150 MG 24 hr tablet Take 150 mg by mouth daily with breakfast. Last filled with retail pharmacy 07/19/15 for #30 day 07/19/15  Yes Historical Provider, MD  clonazePAM (KLONOPIN) 2 MG tablet Take 2 mg by mouth 2 (two) times daily.   Yes Historical Provider, MD  DULoxetine (CYMBALTA) 30 MG capsule Take 30 mg by mouth every evening. Last filled with retail pharmacy 08/07/15 for #90 day supply. 08/07/15  Yes Historical Provider, MD  FLUoxetine (PROZAC) 20 MG capsule Take 40 mg by mouth daily.   Yes Historical Provider, MD  gabapentin (NEURONTIN) 300 MG capsule Take 300 mg by mouth  3 (three) times daily.   Yes Historical Provider, MD  hydrOXYzine (VISTARIL) 50 MG capsule Take 50 mg by mouth at bedtime. Last filled with retail pharmacy 07/15/15 for #30 day supply 07/15/15  Yes Historical Provider, MD  ibuprofen (ADVIL,MOTRIN) 800 MG tablet Take 800 mg by mouth 3 (three) times daily as needed. For pain. Last filled with retail pharmacy for #90 day supply on 05/22/15. 05/22/15  Yes Historical Provider, MD  levothyroxine (SYNTHROID, LEVOTHROID) 75 MCG tablet Take 1 tablet (75 mcg total) by mouth daily. 12/19/11  Yes Janece Canterbury, MD  lithium carbonate (ESKALITH) 450 MG CR tablet Take 450 mg by mouth at bedtime. 09/01/14  Yes Historical Provider, MD  megestrol (MEGACE) 40 MG/ML suspension Take 200 mg by mouth daily.   Yes Historical Provider, MD  pantoprazole (PROTONIX) 40 MG tablet Take 40 mg by mouth daily before breakfast. Last filled with retail pharmacy for #90 day supply on 06/04/15. 06/04/15  Yes Historical Provider, MD  QUEtiapine (SEROQUEL) 400 MG tablet Take 800 mg by mouth at bedtime. Last filled with retail pharmacy 07/19/15 for #30 day supply. 07/19/15  Yes Historical Provider, MD  SUBOXONE 8-2 MG FILM Place 1 Film under the tongue every 12 (twelve) hours. Last filled with retail pharmacy 07/30/15 for #30 day supply. 07/30/15  Yes Historical Provider, MD  VENTOLIN HFA 108 (90 Base) MCG/ACT inhaler Inhale 2 puffs into the lungs 4 (four) times daily as needed. For shortness of breath. 08/07/15  Yes Historical Provider, MD  zolpidem (AMBIEN) 5 MG tablet Take 5 mg by mouth at bedtime as needed. For sleep. Last filled with retail pharmacy on 07/04/15 for #30 tablets. 07/04/15  Yes Historical Provider, MD  ciprofloxacin (CIPRO) 250 MG tablet Take 250 mg by mouth 2 (two) times daily. 7 day therapy course patient was prescribed on 08/07/15. 08/07/15   Historical Provider, MD   Triage Vitals: BP 143/80 mmHg  Pulse 78  Temp(Src) 97.6 F (36.4 C) (Oral)  Resp 18  Ht 5\' 2"  (1.575 m)  Wt 161 lb (73.029 kg)  BMI 29.44 kg/m2  SpO2 99%  LMP 07/12/2015 (Approximate)   Physical Exam  Constitutional: She is oriented to person, place, and time. She appears well-developed and well-nourished. She appears distressed.  HENT:  Head: Normocephalic and atraumatic.  Eyes: EOM are normal. Pupils are equal, round, and reactive to light.  Neck: Normal range of motion. Neck supple.  Cardiovascular: Normal rate, regular rhythm and normal heart sounds.   Pulmonary/Chest: Effort normal and breath sounds normal. No respiratory distress. She has no  wheezes. She has no rales. She exhibits no tenderness.  Abdominal: Soft. Bowel sounds are normal. She exhibits no distension. There is tenderness (moderate diffuse tenderness). There is no rebound and no guarding.  Musculoskeletal: Normal range of motion. She exhibits no edema.  Lymphadenopathy:    She has no cervical adenopathy.  Neurological: She is alert and oriented to person, place, and time.  Skin: Skin is warm and dry. No rash noted.  Psychiatric: She has a normal mood and affect.  Nursing note and vitals reviewed.   ED Course  Procedures (including critical care time)  DIAGNOSTIC STUDIES: Oxygen Saturation is 100% on RA, Normal by my interpretation.    COORDINATION OF CARE: 12:36 AM- Will order blood work, pregnancy urine, and urinalysis. Discussed treatment plan with pt at bedside and pt agreed to plan.     Labs Review Labs Reviewed  URINALYSIS, ROUTINE W REFLEX MICROSCOPIC (NOT AT [facility]) - Abnormal;  Notable for the following:    Color, Urine AMBER (*)    APPearance CLOUDY (*)    Hgb urine dipstick SMALL (*)    Bilirubin Urine SMALL (*)    Ketones, ur 40 (*)    Nitrite POSITIVE (*)    Leukocytes, UA SMALL (*)    All other components within normal limits  BASIC METABOLIC PANEL - Abnormal; Notable for the following:    Potassium 2.9 (*)    Glucose, Bld 144 (*)    All other components within normal limits  CBC - Abnormal; Notable for the following:    RBC 6.63 (*)    MCV 64.3 (*)    MCH 22.2 (*)    Platelets 20 (*)    All other components within normal limits  CBC - Abnormal; Notable for the following:    RBC 6.78 (*)    MCV 63.7 (*)    MCH 21.8 (*)    All other components within normal limits  URINE MICROSCOPIC-ADD ON - Abnormal; Notable for the following:    Squamous Epithelial / LPF 0-5 (*)    Bacteria, UA MANY (*)    Casts HYALINE CASTS (*)    Crystals CA OXALATE CRYSTALS (*)    All other components within normal limits  PREGNANCY, URINE    Imaging  Review Ct Renal Stone Study  08/11/2015  CLINICAL DATA:  Right flank pain for 1 week. Nausea and vomiting. Medicine obtained from primary care physician for kidney infection is not working. EXAM: CT ABDOMEN AND PELVIS WITHOUT CONTRAST TECHNIQUE: Multidetector CT imaging of the abdomen and pelvis was performed following the standard protocol without IV contrast. COMPARISON:  09/09/2014 FINDINGS: Emphysematous changes in the lung bases. Intrarenal stones in the right kidney at the mid and lower pole. No hydronephrosis or hydroureter on either kidney. No ureteral stones. No bladder stones. Mild diffuse bladder wall thickening may indicate cystitis. The unenhanced appearance of the liver, spleen, gallbladder, pancreas, adrenal glands, abdominal aorta, inferior vena cava, and retroperitoneal lymph nodes is unremarkable. Stomach, small bowel, and colon are not abnormally distended. Stool fills the colon. No free air or free fluid in the abdomen. Pelvis: Uterus and ovaries are not enlarged. No free or loculated pelvic fluid collections. No pelvic mass or lymphadenopathy. Appendix is normal. No destructive bone lesions. IMPRESSION: Nonobstructing intrarenal stones in the right kidney. No ureteral stone or obstruction. Mild thickening of bladder wall may indicate cystitis. Electronically Signed   By: Lucienne Capers M.D.   On: 08/11/2015 01:58   I have personally reviewed and evaluated these images and lab results as part of my medical decision-making.   EKG Interpretation None      MDM   Final diagnoses:  UTI (lower urinary tract infection)    Patient is given pain medications and antiemetics in the ED as well as IV fluids. She has evidence of a urinary tract infection but no suggestions of sepsis. Her CT scan is negative for ureteral stones. Continues to have ongoing abdominal pain and vomiting. I consulted the hospitalist to admit the patient for further treatment.  I personally performed the services  described in this documentation, which was scribed in my presence.  The recorded information has been reviewed and considered.   Malvin Johns, MD 08/11/15 [phone]

## 2015-08-11 NOTE — H&P (Signed)
History and Physical    Victoria Flores [id number] DOB: [birth year] DOA: 08/10/2015   PCP: Philis Fendt, MD Chief Complaint:  Chief Complaint  Patient presents with  . Flank Pain    HPI: Victoria Flores is a 50 y.o. female with medical history significant of presumed opiate addiction and abuse (as patient is currently being prescribed suboxone).  Patient presents to the ED with severe, 10/10 abdominal pain.  Pain spikes when she has to vomit.  She was diagnosed with UTI on the 11th, started on Cipro for a 7 day course, has been taking this.  She presents to ED for uncontrolled pain and vomiting.  ED Course: CT abd/pelvis is negative other than bladder wall thickening.  Review of Systems: As per HPI otherwise 10 point review of systems negative.    Past Medical History  Diagnosis Date  . Arthritis   . Prolapsed disk   . Heart murmur   . Sleep deprivation   . Depression   . Sickle cell trait ([place])   . Hypothyroid   . Anxiety   . Renal disorder     Past Surgical History  Procedure Laterality Date  . Cesarean section       reports that she has been smoking.  She does not have any smokeless tobacco history on file. She reports that she does not drink alcohol or use illicit drugs.  Allergies  Allergen Reactions  . Garlic Diarrhea    Vomiting    Family History  Problem Relation Age of Onset  . Breast cancer Sister   . Diabetes Brother       Prior to Admission medications   Medication Sig Start Date End Date Taking? Authorizing Provider  buPROPion (WELLBUTRIN XL) 150 MG 24 hr tablet Take 150 mg by mouth daily with breakfast. Last filled with retail pharmacy 07/19/15 for #30 day 07/19/15  Yes Historical Provider, MD  clonazePAM (KLONOPIN) 2 MG tablet Take 2 mg by mouth 2 (two) times daily.   Yes Historical Provider, MD  DULoxetine (CYMBALTA) 30 MG capsule Take 30 mg by mouth every evening. Last filled with retail pharmacy 08/07/15 for #90 day supply. 08/07/15  Yes  Historical Provider, MD  FLUoxetine (PROZAC) 20 MG capsule Take 40 mg by mouth daily.   Yes Historical Provider, MD  gabapentin (NEURONTIN) 300 MG capsule Take 300 mg by mouth 3 (three) times daily.   Yes Historical Provider, MD  hydrOXYzine (VISTARIL) 50 MG capsule Take 50 mg by mouth at bedtime. Last filled with retail pharmacy 07/15/15 for #30 day supply 07/15/15  Yes Historical Provider, MD  ibuprofen (ADVIL,MOTRIN) 800 MG tablet Take 800 mg by mouth 3 (three) times daily as needed. For pain. Last filled with retail pharmacy for #90 day supply on 05/22/15. 05/22/15  Yes Historical Provider, MD  levothyroxine (SYNTHROID, LEVOTHROID) 75 MCG tablet Take 1 tablet (75 mcg total) by mouth daily. 12/19/11  Yes Janece Canterbury, MD  lithium carbonate (ESKALITH) 450 MG CR tablet Take 450 mg by mouth at bedtime. 09/01/14  Yes Historical Provider, MD  megestrol (MEGACE) 40 MG/ML suspension Take 200 mg by mouth daily.   Yes Historical Provider, MD  pantoprazole (PROTONIX) 40 MG tablet Take 40 mg by mouth daily before breakfast. Last filled with retail pharmacy for #90 day supply on 06/04/15. 06/04/15  Yes Historical Provider, MD  QUEtiapine (SEROQUEL) 400 MG tablet Take 800 mg by mouth at bedtime. Last filled with retail pharmacy 07/19/15 for #30 day supply. 07/19/15  Yes  Historical Provider, MD  SUBOXONE 8-2 MG FILM Place 1 Film under the tongue every 12 (twelve) hours. Last filled with retail pharmacy 07/30/15 for #30 day supply. 07/30/15  Yes Historical Provider, MD  VENTOLIN HFA 108 (90 Base) MCG/ACT inhaler Inhale 2 puffs into the lungs 4 (four) times daily as needed. For shortness of breath. 08/07/15  Yes Historical Provider, MD  zolpidem (AMBIEN) 5 MG tablet Take 5 mg by mouth at bedtime as needed. For sleep. Last filled with retail pharmacy on 07/04/15 for #30 tablets. 07/04/15  Yes Historical Provider, MD  ciprofloxacin (CIPRO) 250 MG tablet Take 250 mg by mouth 2 (two) times daily. 7 day therapy course patient was  prescribed on 08/07/15. 08/07/15   Historical Provider, MD    Physical Exam: Filed Vitals:   08/10/15 2154 08/11/15 0253  BP: 126/93 143/80  Pulse: 102 78  Temp: 98.1 F (36.7 C) 97.6 F (36.4 C)  TempSrc: Oral Oral  Resp: 18 18  Height: 5\' 2"  (1.575 m)   Weight: 73.029 kg (161 lb)   SpO2: 100% 99%      Constitutional: In pain Eyes: PERRL, lids and conjunctivae normal ENMT: Mucous membranes are moist. Posterior pharynx clear of any exudate or lesions.Normal dentition.  Neck: normal, supple, no masses, no thyromegaly Respiratory: clear to auscultation bilaterally, no wheezing, no crackles. Normal respiratory effort. No accessory muscle use.  Cardiovascular: Regular rate and rhythm, no murmurs / rubs / gallops. No extremity edema. 2+ pedal pulses. No carotid bruits.  Abdomen: no tenderness, no masses palpated. No hepatosplenomegaly. Bowel sounds positive.  Musculoskeletal: no clubbing / cyanosis. No joint deformity upper and lower extremities. Good ROM, no contractures. Normal muscle tone.  Skin: no rashes, lesions, ulcers. No induration Neurologic: CN 2-12 grossly intact. Sensation intact, DTR normal. Strength 5/5 in all 4.  Psychiatric: Normal judgment and insight. Alert and oriented x 3. Normal mood.    Labs on Admission: I have personally reviewed following labs and imaging studies  CBC:  Recent Labs Lab 08/10/15 2326 08/11/15 0039  WBC 7.0 9.2  HGB 14.7 14.8  HCT 42.6 43.2  MCV 64.3* 63.7*  PLT 20* [id number]   Basic Metabolic Panel:  Recent Labs Lab 08/10/15 2309  NA 140  K 2.9*  CL 109  CO2 22  GLUCOSE 144*  BUN 6  CREATININE 0.89  CALCIUM 9.4   GFR: Estimated Creatinine Clearance: 72.4 mL/min (by C-G formula based on Cr of 0.89). Liver Function Tests: No results for input(s): AST, ALT, ALKPHOS, BILITOT, PROT, ALBUMIN in the last 168 hours. No results for input(s): LIPASE, AMYLASE in the last 168 hours. No results for input(s): AMMONIA in the last 168  hours. Coagulation Profile: No results for input(s): INR, PROTIME in the last 168 hours. Cardiac Enzymes: No results for input(s): CKTOTAL, CKMB, CKMBINDEX, TROPONINI in the last 168 hours. BNP (last 3 results) No results for input(s): PROBNP in the last 8760 hours. HbA1C: No results for input(s): HGBA1C in the last 72 hours. CBG: No results for input(s): GLUCAP in the last 168 hours. Lipid Profile: No results for input(s): CHOL, HDL, LDLCALC, TRIG, CHOLHDL, LDLDIRECT in the last 72 hours. Thyroid Function Tests: No results for input(s): TSH, T4TOTAL, FREET4, T3FREE, THYROIDAB in the last 72 hours. Anemia Panel: No results for input(s): VITAMINB12, FOLATE, FERRITIN, TIBC, IRON, RETICCTPCT in the last 72 hours. Urine analysis:    Component Value Date/Time   COLORURINE AMBER* 08/11/2015 0015   APPEARANCEUR CLOUDY* 08/11/2015 0015   LABSPEC 1.017  08/11/2015 0015   PHURINE 6.0 08/11/2015 0015   GLUCOSEU NEGATIVE 08/11/2015 0015   HGBUR SMALL* 08/11/2015 0015   BILIRUBINUR SMALL* 08/11/2015 0015   KETONESUR 40* 08/11/2015 0015   PROTEINUR NEGATIVE 08/11/2015 0015   UROBILINOGEN 0.2 09/08/2014 1529   NITRITE POSITIVE* 08/11/2015 0015   LEUKOCYTESUR SMALL* 08/11/2015 0015   Sepsis Labs: @LABRCNTIP (procalcitonin:4,lacticidven:4) )No results found for this or any previous visit (from the past 240 hour(s)).   Radiological Exams on Admission: Ct Renal Stone Study  08/11/2015  CLINICAL DATA:  Right flank pain for 1 week. Nausea and vomiting. Medicine obtained from primary care physician for kidney infection is not working. EXAM: CT ABDOMEN AND PELVIS WITHOUT CONTRAST TECHNIQUE: Multidetector CT imaging of the abdomen and pelvis was performed following the standard protocol without IV contrast. COMPARISON:  09/09/2014 FINDINGS: Emphysematous changes in the lung bases. Intrarenal stones in the right kidney at the mid and lower pole. No hydronephrosis or hydroureter on either kidney. No  ureteral stones. No bladder stones. Mild diffuse bladder wall thickening may indicate cystitis. The unenhanced appearance of the liver, spleen, gallbladder, pancreas, adrenal glands, abdominal aorta, inferior vena cava, and retroperitoneal lymph nodes is unremarkable. Stomach, small bowel, and colon are not abnormally distended. Stool fills the colon. No free air or free fluid in the abdomen. Pelvis: Uterus and ovaries are not enlarged. No free or loculated pelvic fluid collections. No pelvic mass or lymphadenopathy. Appendix is normal. No destructive bone lesions. IMPRESSION: Nonobstructing intrarenal stones in the right kidney. No ureteral stone or obstruction. Mild thickening of bladder wall may indicate cystitis. Electronically Signed   By: Lucienne Capers M.D.   On: 08/11/2015 01:58    EKG: Independently reviewed.  Assessment/Plan Active Problems:   UTI (lower urinary tract infection)   UTI -  Tordol for pain  Continue suboxone, avoid further narcotics  Rocephin   DVT prophylaxis: Lovenox Code Status: Full Family Communication: Family at bedside Consults called: None Admission status: Admit to obs   GARDNER, Martha Hospital[facility] Pager [phone] from 7PM-7AM  If 7AM-7PM, please contact the day physician for the patient [email or web address] Password [email or web address]  08/11/2015, 6:16 AM

## 2015-08-11 NOTE — ED Notes (Signed)
Pt again screaming.

## 2015-08-11 NOTE — ED Notes (Signed)
Pt's daughter at desk requesting ice for herself, advised her pt should not have anything to drink while nauseated.  Upon entry to room, daughter assisting patient with drinking ice water, pt then c/o nausea and severe pain. I reiterated to patient and family she should not eat or drink if she continues to have nausea/vomiting.

## 2015-08-11 NOTE — Progress Notes (Signed)
PROGRESS NOTE                                                                                                                                                                                                             Patient Demographics:    Victoria Flores, is a 50 y.o. female, DOB - Jun 29, 1965, [id number]  Admit date - 08/10/2015   Admitting Physician Etta Quill, DO  Outpatient Primary MD for the patient is Philis Fendt, MD  LOS -   Outpatient Specialists: Pain clinic  Chief Complaint  Patient presents with  . Flank Pain       Brief Narrative   22 female with history of opiate dependence (for slipped disc and arthritis, currently on Suboxone) presented to the ED with severe lower abdominal pain associated with nausea and vomiting. She was diagnosed with UTI 4 days prior to admission and was given a seven-day course of ciprofloxacin.  In the ED vitals were stable. Blood work was unremarkable. CT renal study showed nonobstructing right kidney stones, no ureteral stone or obstruction. Showed bladder thickening suggestive of cystitis.   Subjective:    Patient nauseous and complaining of lower abdominal pain   Assessment  & Plan :    Active Problems:   UTI (lower urinary tract infection) Empiric Rocephin. Urine culture not sent. Supportive care with pain medications and antiemetics.  Lower abdominal pain with ongoing nausea and vomiting Suspect due to her infection. Continue IV Toradol. When necessary Zofran alternating with Reglan for ongoing nausea and vomiting. Order IV fluids.  Anxiety and depression Resume home medications  Chronic back pain Resume home dose of Suboxone.  Tobacco abuse Counseled on cessation.      Code Status : Full code  Family Communication  : Son at bedside  Disposition Plan  : Home tomorrow if symptoms improved   Barriers For Discharge :Active symptoms  Consults  :  None  Procedures  : CT renal study  DVT Prophylaxis  :  Lovenox -  Lab Results  Component Value Date   PLT 202 08/11/2015    Antibiotics  :   Anti-infectives    Start     Dose/Rate Route Frequency Ordered Stop   08/12/15 0230  cefTRIAXone (ROCEPHIN) 1 g in dextrose 5 % 50 mL IVPB     1 g 100  mL/hr over 30 Minutes Intravenous Every 24 hours 08/11/15 0527     08/11/15 0245  cefTRIAXone (ROCEPHIN) 1 g in dextrose 5 % 50 mL IVPB     1 g 100 mL/hr over 30 Minutes Intravenous  Once 08/11/15 0234 08/11/15 0349        Objective:   Filed Vitals:   08/10/15 2154 08/11/15 0253 08/11/15 0820  BP: 126/93 143/80 137/75  Pulse: 102 78 69  Temp: 98.1 F (36.7 C) 97.6 F (36.4 C) 97.9 F (36.6 C)  TempSrc: Oral Oral Oral  Resp: 18 18 20   Height: 5\' 2"  (1.575 m)  5\' 2"  (1.575 m)  Weight: 73.029 kg (161 lb)  73.301 kg (161 lb 9.6 oz)  SpO2: 100% 99% 100%    Wt Readings from Last 3 Encounters:  08/11/15 73.301 kg (161 lb 9.6 oz)  09/11/14 61.7 kg (136 lb 0.4 oz)  12/02/13 63.957 kg (141 lb)    No intake or output data in the 24 hours ending 08/11/15 1232   Physical Exam  [id number]  HEENT:Dry  mucosa, supple neck Chest: clear b/l, no added sounds CVS: N S1&S2, no murmurs GI: soft,  ND, BS+, suprapubic tenderness Musculoskeletal: warm, no edema     Data Review:    CBC  Recent Labs Lab 08/10/15 2326 08/11/15 0039  WBC 7.0 9.2  HGB 14.7 14.8  HCT 42.6 43.2  PLT 20* 202  MCV 64.3* 63.7*  MCH 22.2* 21.8*  MCHC 34.5 34.3  RDW 15.0 15.0    Chemistries   Recent Labs Lab 08/10/15 2309 08/11/15 0944  NA 140  --   K 2.9* 4.0  CL 109  --   CO2 22  --   GLUCOSE 144*  --   BUN 6  --   CREATININE 0.89  --   CALCIUM 9.4  --    ------------------------------------------------------------------------------------------------------------------ No results for input(s): CHOL, HDL, LDLCALC, TRIG, CHOLHDL, LDLDIRECT in the last 72 hours.  No results found for:  HGBA1C ------------------------------------------------------------------------------------------------------------------ No results for input(s): TSH, T4TOTAL, T3FREE, THYROIDAB in the last 72 hours.  Invalid input(s): FREET3 ------------------------------------------------------------------------------------------------------------------ No results for input(s): VITAMINB12, FOLATE, FERRITIN, TIBC, IRON, RETICCTPCT in the last 72 hours.  Coagulation profile No results for input(s): INR, PROTIME in the last 168 hours.  No results for input(s): DDIMER in the last 72 hours.  Cardiac Enzymes No results for input(s): CKMB, TROPONINI, MYOGLOBIN in the last 168 hours.  Invalid input(s): CK ------------------------------------------------------------------------------------------------------------------ No results found for: BNP  Inpatient Medications  Scheduled Meds: . buprenorphine  8 mg Sublingual BID  . buPROPion  150 mg Oral Q breakfast  . [START ON 08/12/2015] cefTRIAXone (ROCEPHIN)  IV  1 g Intravenous Q24H  . clonazePAM  2 mg Oral BID  . DULoxetine  30 mg Oral QPM  . enoxaparin (LOVENOX) injection  40 mg Subcutaneous Q24H  . FLUoxetine  40 mg Oral Daily  . gabapentin  300 mg Oral TID  . hydrOXYzine  50 mg Oral QHS  . ketorolac  30 mg Intravenous Q6H  . levothyroxine  75 mcg Oral QAC breakfast  . lithium carbonate  450 mg Oral QHS  . megestrol  200 mg Oral Daily  . ondansetron  4 mg Intravenous Once  . pantoprazole  40 mg Oral QAC breakfast  . potassium chloride  10 mEq Intravenous Once  . QUEtiapine  800 mg Oral QHS   Continuous Infusions:  PRN Meds:.albuterol, ondansetron **OR** ondansetron (ZOFRAN) IV, zolpidem  Micro Results No results found  for this or any previous visit (from the past 240 hour(s)).  Radiology Reports Ct Renal Stone Study  08/11/2015  CLINICAL DATA:  Right flank pain for 1 week. Nausea and vomiting. Medicine obtained from primary care physician  for kidney infection is not working. EXAM: CT ABDOMEN AND PELVIS WITHOUT CONTRAST TECHNIQUE: Multidetector CT imaging of the abdomen and pelvis was performed following the standard protocol without IV contrast. COMPARISON:  09/09/2014 FINDINGS: Emphysematous changes in the lung bases. Intrarenal stones in the right kidney at the mid and lower pole. No hydronephrosis or hydroureter on either kidney. No ureteral stones. No bladder stones. Mild diffuse bladder wall thickening may indicate cystitis. The unenhanced appearance of the liver, spleen, gallbladder, pancreas, adrenal glands, abdominal aorta, inferior vena cava, and retroperitoneal lymph nodes is unremarkable. Stomach, small bowel, and colon are not abnormally distended. Stool fills the colon. No free air or free fluid in the abdomen. Pelvis: Uterus and ovaries are not enlarged. No free or loculated pelvic fluid collections. No pelvic mass or lymphadenopathy. Appendix is normal. No destructive bone lesions. IMPRESSION: Nonobstructing intrarenal stones in the right kidney. No ureteral stone or obstruction. Mild thickening of bladder wall may indicate cystitis. Electronically Signed   By: Lucienne Capers M.D.   On: 08/11/2015 01:58    Time Spent in minutes  20   Louellen Molder M.D on 08/11/2015 at 12:32 PM  Between 7am to 7pm - Pager - [phone]  After 7pm go to [email or web address] - password [facility]  [facility] -  Office  [phone]

## 2015-08-11 NOTE — ED Notes (Signed)
Multiple family members at bedside, family spoke this RN regarding pts lack of self care at home, such as no water intake only large amounts of Pepsi and Sweet Tea. Pt states she is unable to take KCL at this time, stating she will vomit if she attempts. MD aware

## 2015-08-11 NOTE — ED Notes (Signed)
Pt assisted on bedpan, @200ml  malodorous tea colored urine, sample obtained

## 2015-08-11 NOTE — ED Notes (Signed)
Pt's family at bedside, family states pt is on Suboxone for narcotic addiction. Pt states she does not want to be addicted to pills. Per family pt does not take medication as directed. Pt intermittently hot/cold. Pain intermittent and severe per patient

## 2015-08-12 DIAGNOSIS — N10 Acute tubulo-interstitial nephritis: Secondary | ICD-10-CM

## 2015-08-12 DIAGNOSIS — R110 Nausea: Secondary | ICD-10-CM

## 2015-08-12 DIAGNOSIS — N3000 Acute cystitis without hematuria: Secondary | ICD-10-CM | POA: Diagnosis not present

## 2015-08-12 DIAGNOSIS — M545 Low back pain: Secondary | ICD-10-CM | POA: Diagnosis not present

## 2015-08-12 DIAGNOSIS — N390 Urinary tract infection, site not specified: Secondary | ICD-10-CM | POA: Diagnosis not present

## 2015-08-12 MED ORDER — CIPROFLOXACIN HCL 500 MG PO TABS: 500.0000 mg | ORAL_TABLET | Freq: Two times a day (BID) | ORAL | Status: AC

## 2015-08-12 NOTE — Progress Notes (Signed)
Patient discharged to home, all discharge medications and instructions reviewed and questions answered.  Patient to be assisted to vehicle by wheelchair.  

## 2015-08-12 NOTE — Discharge Instructions (Signed)

## 2015-08-12 NOTE — Discharge Summary (Signed)
Physician Discharge Summary  Victoria Flores [id number] DOB: 07-18-1965 DOA: 08/10/2015  PCP: Philis Fendt, MD  Admit date: 08/10/2015 Discharge date: 08/12/2015  Admitted From: home Disposition: home Recommendations for Outpatient Follow-up:  1. Follow up with PCP in 1-2 weeks. Completes total 10 days of abx on 7/22 ( prescribed 7 days abx on discharge)   Home Health: none Equipment/Devices: none  Discharge Condition: stable CODE STATUS: full code Diet recommendation: regular    Discharge Diagnoses:  Principle problem   UTI (lower urinary tract infection)   Active Problems:   Nausea and vomiting in adult   Midline low back pain without sciatica   Acute pyelonephritis   Acute cystitis  Brief Narrative  44 female with history of opiate dependence (for slipped disc and arthritis, currently on Suboxone) presented to the ED with severe lower abdominal pain associated with nausea and vomiting. She was diagnosed with UTI 4 days prior to admission and was given a seven-day course of ciprofloxacin.  In the ED vitals were stable. Blood work was unremarkable. CT renal study showed nonobstructing right kidney stones, no ureteral stone or obstruction. Showed bladder thickening suggestive of cystitis.  Active Problems:  UTI (lower urinary tract infection) Possibly had cystitis with acute right sided pyelonephritis. Received empiric Rocephin. Urine culture not sent. Supportive care with pain medications and antiemetics. Improved. Pt was prescibed ciprofloxacin 250 mg bid for 7 day course by PCP prior to admission. i will discharge her on ciprofloxacin 500 mg bid for 7 days ( will receive total 10 days of antibiotics.  Lower abdominal pain with ongoing nausea and vomiting Suspect due to her infection. Given IV fluids, toradol and antiemetics with improvement.  Anxiety and depression Resume home medications  Chronic back pain continue Suboxone.  Tobacco abuse Counseled  on cessation.       Family Communication : Son at bedside  Disposition Plan : Home     Consults : None   Procedures : CT renal study     Discharge Instructions     Medication List    TAKE these medications        buPROPion 150 MG 24 hr tablet  Commonly known as:  WELLBUTRIN XL  Take 150 mg by mouth daily with breakfast. Last filled with retail pharmacy 07/19/15 for #30 day     ciprofloxacin 500 MG tablet  Commonly known as:  CIPRO  Take 1 tablet (500 mg total) by mouth 2 (two) times daily.     clonazePAM 2 MG tablet  Commonly known as:  KLONOPIN  Take 2 mg by mouth 2 (two) times daily.     DULoxetine 30 MG capsule  Commonly known as:  CYMBALTA  Take 30 mg by mouth every evening. Last filled with retail pharmacy 08/07/15 for #90 day supply.     FLUoxetine 20 MG capsule  Commonly known as:  PROZAC  Take 40 mg by mouth daily.     gabapentin 300 MG capsule  Commonly known as:  NEURONTIN  Take 300 mg by mouth 3 (three) times daily.     hydrOXYzine 50 MG capsule  Commonly known as:  VISTARIL  Take 50 mg by mouth at bedtime. Last filled with retail pharmacy 07/15/15 for #30 day supply     ibuprofen 800 MG tablet  Commonly known as:  ADVIL,MOTRIN  Take 800 mg by mouth 3 (three) times daily as needed. For pain. Last filled with retail pharmacy for #90 day supply on 05/22/15.  levothyroxine 75 MCG tablet  Commonly known as:  SYNTHROID, LEVOTHROID  Take 1 tablet (75 mcg total) by mouth daily.     lithium carbonate 450 MG CR tablet  Commonly known as:  ESKALITH  Take 450 mg by mouth at bedtime.     megestrol 40 MG/ML suspension  Commonly known as:  MEGACE  Take 200 mg by mouth daily.     pantoprazole 40 MG tablet  Commonly known as:  PROTONIX  Take 40 mg by mouth daily before breakfast. Last filled with retail pharmacy for #90 day supply on 06/04/15.     QUEtiapine 400 MG tablet  Commonly known as:  SEROQUEL  Take 800 mg by mouth at bedtime. Last  filled with retail pharmacy 07/19/15 for #30 day supply.     SUBOXONE 8-2 MG Film  Generic drug:  Buprenorphine HCl-Naloxone HCl  Place 1 Film under the tongue every 12 (twelve) hours. Last filled with retail pharmacy 07/30/15 for #30 day supply.     VENTOLIN HFA 108 (90 Base) MCG/ACT inhaler  Generic drug:  albuterol  Inhale 2 puffs into the lungs 4 (four) times daily as needed. For shortness of breath.     zolpidem 5 MG tablet  Commonly known as:  AMBIEN  Take 5 mg by mouth at bedtime as needed. For sleep. Last filled with retail pharmacy on 07/04/15 for #30 tablets.           Follow-up Information    Follow up with Philis Fendt, MD. Schedule an appointment as soon as possible for a visit in 1 week.   Specialty:  Internal Medicine   Contact information:   [place] [address] [phone]      Allergies  Allergen Reactions  . Garlic Diarrhea    Vomiting    Consultations: none  Procedures/Studies: Ct Renal Stone Study  08/11/2015  CLINICAL DATA:  Right flank pain for 1 week. Nausea and vomiting. Medicine obtained from primary care physician for kidney infection is not working. EXAM: CT ABDOMEN AND PELVIS WITHOUT CONTRAST TECHNIQUE: Multidetector CT imaging of the abdomen and pelvis was performed following the standard protocol without IV contrast. COMPARISON:  09/09/2014 FINDINGS: Emphysematous changes in the lung bases. Intrarenal stones in the right kidney at the mid and lower pole. No hydronephrosis or hydroureter on either kidney. No ureteral stones. No bladder stones. Mild diffuse bladder wall thickening may indicate cystitis. The unenhanced appearance of the liver, spleen, gallbladder, pancreas, adrenal glands, abdominal aorta, inferior vena cava, and retroperitoneal lymph nodes is unremarkable. Stomach, small bowel, and colon are not abnormally distended. Stool fills the colon. No free air or free fluid in the abdomen. Pelvis: Uterus and ovaries are not  enlarged. No free or loculated pelvic fluid collections. No pelvic mass or lymphadenopathy. Appendix is normal. No destructive bone lesions. IMPRESSION: Nonobstructing intrarenal stones in the right kidney. No ureteral stone or obstruction. Mild thickening of bladder wall may indicate cystitis. Electronically Signed   By: Lucienne Capers M.D.   On: 08/11/2015 01:58       Subjective: abdominal pain much improved. No dysuria or fever  Discharge Exam: Filed Vitals:   08/11/15 2057 08/12/15 0534  BP: 128/84 118/65  Pulse: 73 106  Temp: 98 F (36.7 C) 98.5 F (36.9 C)  Resp: 16 16   Filed Vitals:   08/11/15 0820 08/11/15 1456 08/11/15 2057 08/12/15 0534  BP: 137/75 131/74 128/84 118/65  Pulse: 69 74 73 106  Temp: 97.9 F (36.6  C) 98 F (36.7 C) 98 F (36.7 C) 98.5 F (36.9 C)  TempSrc: Oral Oral Oral Oral  Resp: 20 18 16 16   Height: 5\' 2"  (1.575 m)     Weight: 73.301 kg (161 lb 9.6 oz)     SpO2: 100% 99% 100% 100%    Gen: not in distress HEENT:moist mucosa, supple neck Chest: clear b/l, no added sounds CVS: N S1&S2, no murmurs GI: soft, ND, BS+, minimal suprapubic tenderness Musculoskeletal: warm, no edema    The results of significant diagnostics from this hospitalization (including imaging, microbiology, ancillary and laboratory) are listed below for reference.     Microbiology: No results found for this or any previous visit (from the past 240 hour(s)).   Labs: BNP (last 3 results) No results for input(s): BNP in the last 8760 hours. Basic Metabolic Panel:  Recent Labs Lab 08/10/15 2309 08/11/15 0944  NA 140  --   K 2.9* 4.0  CL 109  --   CO2 22  --   GLUCOSE 144*  --   BUN 6  --   CREATININE 0.89  --   CALCIUM 9.4  --    Liver Function Tests: No results for input(s): AST, ALT, ALKPHOS, BILITOT, PROT, ALBUMIN in the last 168 hours. No results for input(s): LIPASE, AMYLASE in the last 168 hours. No results for input(s): AMMONIA in the last 168  hours. CBC:  Recent Labs Lab 08/10/15 2326 08/11/15 0039  WBC 7.0 9.2  HGB 14.7 14.8  HCT 42.6 43.2  MCV 64.3* 63.7*  PLT 20* 202   Cardiac Enzymes: No results for input(s): CKTOTAL, CKMB, CKMBINDEX, TROPONINI in the last 168 hours. BNP: Invalid input(s): POCBNP CBG: No results for input(s): GLUCAP in the last 168 hours. D-Dimer No results for input(s): DDIMER in the last 72 hours. Hgb A1c No results for input(s): HGBA1C in the last 72 hours. Lipid Profile No results for input(s): CHOL, HDL, LDLCALC, TRIG, CHOLHDL, LDLDIRECT in the last 72 hours. Thyroid function studies No results for input(s): TSH, T4TOTAL, T3FREE, THYROIDAB in the last 72 hours.  Invalid input(s): FREET3 Anemia work up No results for input(s): VITAMINB12, FOLATE, FERRITIN, TIBC, IRON, RETICCTPCT in the last 72 hours. Urinalysis    Component Value Date/Time   COLORURINE AMBER* 08/11/2015 0015   APPEARANCEUR CLOUDY* 08/11/2015 0015   LABSPEC 1.017 08/11/2015 0015   PHURINE 6.0 08/11/2015 0015   GLUCOSEU NEGATIVE 08/11/2015 0015   HGBUR SMALL* 08/11/2015 0015   BILIRUBINUR SMALL* 08/11/2015 0015   KETONESUR 40* 08/11/2015 0015   PROTEINUR NEGATIVE 08/11/2015 0015   UROBILINOGEN 0.2 09/08/2014 1529   NITRITE POSITIVE* 08/11/2015 0015   LEUKOCYTESUR SMALL* 08/11/2015 0015   Sepsis Labs Invalid input(s): PROCALCITONIN,  WBC,  LACTICIDVEN Microbiology No results found for this or any previous visit (from the past 240 hour(s)).   Time coordinating discharge: < 30 minutes  SIGNED:   Louellen Molder, MD  [facility] 08/12/2015, 9:20 AM Pager   If 7PM-7AM, please contact night-coverage [email or web address] Password [email or web address]

## 2015-10-06 ENCOUNTER — Emergency Department (HOSPITAL_COMMUNITY): Payer: Medicare Other

## 2015-10-06 ENCOUNTER — Encounter (HOSPITAL_COMMUNITY): Payer: Self-pay | Admitting: Oncology

## 2015-10-06 ENCOUNTER — Emergency Department (HOSPITAL_COMMUNITY)
Admission: EM | Admit: 2015-10-06 | Discharge: 2015-10-07 | Disposition: A | Discharge status: Home / Self Care | Payer: Medicare Other | Attending: Emergency Medicine | Admitting: Emergency Medicine

## 2015-10-06 DIAGNOSIS — R0789 Other chest pain: Secondary | ICD-10-CM | POA: Diagnosis not present

## 2015-10-06 DIAGNOSIS — R51 Headache: Secondary | ICD-10-CM | POA: Insufficient documentation

## 2015-10-06 DIAGNOSIS — Z791 Long term (current) use of non-steroidal anti-inflammatories (NSAID): Secondary | ICD-10-CM | POA: Insufficient documentation

## 2015-10-06 DIAGNOSIS — R630 Anorexia: Secondary | ICD-10-CM | POA: Insufficient documentation

## 2015-10-06 DIAGNOSIS — H538 Other visual disturbances: Secondary | ICD-10-CM | POA: Diagnosis not present

## 2015-10-06 DIAGNOSIS — R112 Nausea with vomiting, unspecified: Secondary | ICD-10-CM | POA: Insufficient documentation

## 2015-10-06 DIAGNOSIS — F17200 Nicotine dependence, unspecified, uncomplicated: Secondary | ICD-10-CM | POA: Diagnosis not present

## 2015-10-06 DIAGNOSIS — M7989 Other specified soft tissue disorders: Secondary | ICD-10-CM | POA: Insufficient documentation

## 2015-10-06 DIAGNOSIS — E039 Hypothyroidism, unspecified: Secondary | ICD-10-CM | POA: Diagnosis not present

## 2015-10-06 DIAGNOSIS — R531 Weakness: Secondary | ICD-10-CM | POA: Insufficient documentation

## 2015-10-06 DIAGNOSIS — R42 Dizziness and giddiness: Secondary | ICD-10-CM | POA: Diagnosis not present

## 2015-10-06 DIAGNOSIS — Z79899 Other long term (current) drug therapy: Secondary | ICD-10-CM | POA: Diagnosis not present

## 2015-10-06 DIAGNOSIS — R5383 Other fatigue: Secondary | ICD-10-CM | POA: Insufficient documentation

## 2015-10-06 LAB — HEPATIC FUNCTION PANEL
ALBUMIN: 4.3 g/dL (ref 3.5–5.0)
ALK PHOS: 76 U/L (ref 38–126)
ALT: 17 U/L (ref 14–54)
AST: 22 U/L (ref 15–41)
BILIRUBIN TOTAL: 0.8 mg/dL (ref 0.3–1.2)
Bilirubin, Direct: 0.2 mg/dL (ref 0.1–0.5)
Indirect Bilirubin: 0.6 mg/dL (ref 0.3–0.9)
TOTAL PROTEIN: 7.9 g/dL (ref 6.5–8.1)

## 2015-10-06 LAB — BASIC METABOLIC PANEL
Anion gap: 6 (ref 5–15)
BUN: 9 mg/dL (ref 6–20)
CALCIUM: 9.5 mg/dL (ref 8.9–10.3)
CO2: 23 mmol/L (ref 22–32)
Chloride: 113 mmol/L — ABNORMAL HIGH (ref 101–111)
Creatinine, Ser: 0.93 mg/dL (ref 0.44–1.00)
GFR calc Af Amer: >60 mL/min (ref >60)
GLUCOSE: 113 mg/dL — AB (ref 65–99)
POTASSIUM: 3.3 mmol/L — AB (ref 3.5–5.1)
Sodium: 142 mmol/L (ref 135–145)

## 2015-10-06 LAB — URINALYSIS, ROUTINE W REFLEX MICROSCOPIC
BILIRUBIN URINE: NEGATIVE
Glucose, UA: NEGATIVE mg/dL
KETONES UR: NEGATIVE mg/dL
Leukocytes, UA: NEGATIVE
NITRITE: NEGATIVE
PH: 5.5 (ref 5.0–8.0)
Protein, ur: NEGATIVE mg/dL
Specific Gravity, Urine: 1.017 (ref 1.005–1.030)

## 2015-10-06 LAB — CBC
HEMATOCRIT: 39.1 % (ref 36.0–46.0)
Hemoglobin: 13.1 g/dL (ref 12.0–15.0)
MCH: 22.1 pg — AB (ref 26.0–34.0)
MCHC: 33.5 g/dL (ref 30.0–36.0)
MCV: 65.9 fL — ABNORMAL LOW (ref 78.0–100.0)
Platelets: 193 10*3/uL (ref 150–400)
RBC: 5.93 MIL/uL — ABNORMAL HIGH (ref 3.87–5.11)
RDW: 15.6 % — AB (ref 11.5–15.5)
WBC: 11.4 10*3/uL — ABNORMAL HIGH (ref 4.0–10.5)

## 2015-10-06 LAB — I-STAT TROPONIN, ED: TROPONIN I, POC: 0 ng/mL (ref 0.00–0.08)

## 2015-10-06 LAB — TSH: TSH: 3.7 u[IU]/mL (ref 0.350–4.500)

## 2015-10-06 LAB — URINE MICROSCOPIC-ADD ON

## 2015-10-06 LAB — LITHIUM LEVEL

## 2015-10-06 LAB — CBG MONITORING, ED: Glucose-Capillary: 96 mg/dL (ref 65–99)

## 2015-10-06 MED ORDER — SODIUM CHLORIDE 0.9 % IV BOLUS (SEPSIS)
1000.0000 mL | Freq: Once | INTRAVENOUS | Status: AC
  Administered 2015-10-06: 1000 mL via INTRAVENOUS

## 2015-10-06 MED ORDER — ONDANSETRON HCL 4 MG/2ML IJ SOLN
4.0000 mg | Freq: Once | INTRAMUSCULAR | Status: AC
  Administered 2015-10-06: 4 mg via INTRAVENOUS
  Filled 2015-10-06: qty 2

## 2015-10-06 NOTE — ED Notes (Signed)
Patient transported to X-ray 

## 2015-10-06 NOTE — ED Provider Notes (Signed)
[facility] DEPT Provider Note   CSN: [id number] Arrival date & time: 10/06/15  1941     History   Chief Complaint Chief Complaint  Patient presents with  . Weakness    HPI Victoria Flores is a 50 y.o. female.  Patient with history of depression and anxiety on lithium and Klonopin, hypothyroidism on Synthroid, Suboxone use -- presents with multiple complaints. Patient states that for the past 1 month she has had chest pain described as pressure sitting on her mid chest. This is constant. It radiates to her left arm. It has not changed with food or activity. No associated SOB. She has been generally weak, especially in her bilateral legs. She has fallen at home. She has dizziness described as lightheadedness, spinning, and imbalance when walking. She has had headaches all day long that are new for her over the past 1 month. She states that she has vomiting every time she eats or drinks. No significant diarrhea or urinary symptoms. No new medication changes or additions. States that she was unable to follow-up with her primary care physician because they have been on vacation. Patient states that today she became frustrated and could not take it anymore, prompting emergency department visit. No reported history of HTN, high cholesterol, DM, smoking. Patient denies risk factors for pulmonary embolism including: unilateral leg swelling, history of DVT/PE/other blood clots, use of exogenous hormones, recent immobilizations, recent surgery, recent travel (>4hr segment), malignancy, hemoptysis.        Past Medical History:  Diagnosis Date  . Anxiety   . Arthritis   . Depression   . Heart murmur   . Hypothyroid   . Prolapsed disk   . Renal disorder   . Sickle cell trait ([place])   . Sleep deprivation     Patient Active Problem List   Diagnosis Date Noted  . Acute pyelonephritis 08/12/2015  . Acute cystitis 08/12/2015  . E coli bacteremia 09/11/2014  . Sepsis ([place]) 09/09/2014  .  Bacteremia 09/09/2014  . Pyelonephritis 09/08/2014  . Midline low back pain without sciatica 12/02/2013  . Irritable bowel syndrome (IBS) 08/17/2012  . Anemia 08/17/2012  . Pelvic pain 08/17/2012  . Vaginitis and vulvovaginitis, unspecified 08/17/2012  . Abdominal pain, epigastric 08/17/2012  . GAD (generalized anxiety disorder) 08/08/2012  . Abdominal pain, diffuse 08/07/2012  . Diarrhea 08/07/2012  . Hypokalemia 08/07/2012  . Nausea and vomiting in adult 12/15/2011  . UTI (lower urinary tract infection) 12/15/2011  . Depression 12/15/2011  . Hypothyroidism 12/15/2011    Past Surgical History:  Procedure Laterality Date  . CESAREAN SECTION      OB History    No data available       Home Medications    Prior to Admission medications   Medication Sig Start Date End Date Taking? Authorizing Provider  buPROPion (WELLBUTRIN XL) 150 MG 24 hr tablet Take 150 mg by mouth daily with breakfast. Last filled with retail pharmacy 07/19/15 for #30 day 07/19/15   Historical Provider, MD  clonazePAM (KLONOPIN) 2 MG tablet Take 2 mg by mouth 2 (two) times daily.    Historical Provider, MD  DULoxetine (CYMBALTA) 30 MG capsule Take 30 mg by mouth every evening. Last filled with retail pharmacy 08/07/15 for #90 day supply. 08/07/15   Historical Provider, MD  FLUoxetine (PROZAC) 20 MG capsule Take 40 mg by mouth daily.    Historical Provider, MD  gabapentin (NEURONTIN) 300 MG capsule Take 300 mg by mouth 3 (three) times daily.  Historical Provider, MD  hydrOXYzine (VISTARIL) 50 MG capsule Take 50 mg by mouth at bedtime. Last filled with retail pharmacy 07/15/15 for #30 day supply 07/15/15   Historical Provider, MD  ibuprofen (ADVIL,MOTRIN) 800 MG tablet Take 800 mg by mouth 3 (three) times daily as needed. For pain. Last filled with retail pharmacy for #90 day supply on 05/22/15. 05/22/15   Historical Provider, MD  levothyroxine (SYNTHROID, LEVOTHROID) 75 MCG tablet Take 1 tablet (75 mcg total) by  mouth daily. 12/19/11   Janece Canterbury, MD  lithium carbonate (ESKALITH) 450 MG CR tablet Take 450 mg by mouth at bedtime. 09/01/14   Historical Provider, MD  megestrol (MEGACE) 40 MG/ML suspension Take 200 mg by mouth daily.    Historical Provider, MD  pantoprazole (PROTONIX) 40 MG tablet Take 40 mg by mouth daily before breakfast. Last filled with retail pharmacy for #90 day supply on 06/04/15. 06/04/15   Historical Provider, MD  QUEtiapine (SEROQUEL) 400 MG tablet Take 800 mg by mouth at bedtime. Last filled with retail pharmacy 07/19/15 for #30 day supply. 07/19/15   Historical Provider, MD  SUBOXONE 8-2 MG FILM Place 1 Film under the tongue every 12 (twelve) hours. Last filled with retail pharmacy 07/30/15 for #30 day supply. 07/30/15   Historical Provider, MD  VENTOLIN HFA 108 (90 Base) MCG/ACT inhaler Inhale 2 puffs into the lungs 4 (four) times daily as needed. For shortness of breath. 08/07/15   Historical Provider, MD  zolpidem (AMBIEN) 5 MG tablet Take 5 mg by mouth at bedtime as needed. For sleep. Last filled with retail pharmacy on 07/04/15 for #30 tablets. 07/04/15   Historical Provider, MD    Family History Family History  Problem Relation Age of Onset  . Breast cancer Sister   . Diabetes Brother     Social History Social History  Substance Use Topics  . Smoking status: Current Some Day Smoker    Packs/day: 0.50    Years: 21.00  . Smokeless tobacco: Never Used  . Alcohol use No     Allergies   Garlic   Review of Systems Review of Systems  Constitutional: Positive for appetite change and fatigue. Negative for diaphoresis and fever.  HENT: Negative for congestion, rhinorrhea and sore throat.   Eyes: Positive for visual disturbance (chronic blurry vision). Negative for redness.  Respiratory: Positive for chest tightness. Negative for cough, shortness of breath and wheezing.   Cardiovascular: Positive for chest pain and leg swelling (bilateral).  Gastrointestinal: Positive for  nausea and vomiting. Negative for abdominal pain, blood in stool and diarrhea.  Genitourinary: Negative for dysuria.  Musculoskeletal: Negative for myalgias.  Skin: Negative for rash.  Neurological: Positive for dizziness, weakness, light-headedness and headaches. Negative for syncope and speech difficulty.     Physical Exam Updated Vital Signs BP 140/97 (BP Location: Right Arm)   Pulse 94   Temp 98.4 F (36.9 C) (Oral)   Resp 21   Ht 5\' 2"  (1.575 m)   Wt 73.5 kg   SpO2 100%   BMI 29.63 kg/m   Physical Exam  Constitutional: She is oriented to person, place, and time. She appears well-developed and well-nourished.  HENT:  Head: Normocephalic and atraumatic.  Right Ear: Tympanic membrane, external ear and ear canal normal.  Left Ear: Tympanic membrane, external ear and ear canal normal.  Nose: Nose normal.  Mouth/Throat: Uvula is midline, oropharynx is clear and moist and mucous membranes are normal.  Eyes: Conjunctivae, EOM and lids are normal. Pupils are  equal, round, and reactive to light. Right eye exhibits no discharge. Left eye exhibits no discharge. Right eye exhibits no nystagmus. Left eye exhibits no nystagmus.  Neck: Normal range of motion. Neck supple. No thyromegaly present.  Cardiovascular: Normal rate, regular rhythm and normal heart sounds.   No murmur heard. Pulmonary/Chest: Effort normal and breath sounds normal. No respiratory distress. She has no wheezes. She has no rales.  Abdominal: Soft. There is no tenderness.  Musculoskeletal: Normal range of motion.       Cervical back: She exhibits normal range of motion, no tenderness and no bony tenderness.  Neurological: She is alert and oriented to person, place, and time. She has normal strength and normal reflexes. No cranial nerve deficit or sensory deficit. She displays a negative Romberg sign. Coordination and gait normal. GCS eye subscore is 4. GCS verbal subscore is 5. GCS motor subscore is 6.  Skin: Skin is  warm and dry.  Psychiatric: She has a normal mood and affect.  Nursing note and vitals reviewed.    ED Treatments / Results  Labs (all labs ordered are listed, but only abnormal results are displayed) Labs Reviewed  BASIC METABOLIC PANEL - Abnormal; Notable for the following:       Result Value   Potassium 3.3 (*)    Chloride 113 (*)    Glucose, Bld 113 (*)    All other components within normal limits  CBC - Abnormal; Notable for the following:    WBC 11.4 (*)    RBC 5.93 (*)    MCV 65.9 (*)    MCH 22.1 (*)    RDW 15.6 (*)    All other components within normal limits  URINALYSIS, ROUTINE W REFLEX MICROSCOPIC (NOT AT [facility]) - Abnormal; Notable for the following:    Hgb urine dipstick TRACE (*)    All other components within normal limits  LITHIUM LEVEL - Abnormal; Notable for the following:    Lithium Lvl <0.06 (*)    All other components within normal limits  URINE MICROSCOPIC-ADD ON - Abnormal; Notable for the following:    Squamous Epithelial / LPF 0-5 (*)    Bacteria, UA FEW (*)    All other components within normal limits  TSH  HEPATIC FUNCTION PANEL  CBG MONITORING, ED  I-STAT TROPOININ, ED    EKG  EKG Interpretation  Date/Time:  Saturday October 06 2015 19:49:47 EDT Ventricular Rate:  98 PR Interval:    QRS Duration: 74 QT Interval:  389 QTC Calculation: 497 R Axis:   37 Text Interpretation:  Sinus rhythm Probable left atrial enlargement Borderline prolonged QT interval No significant change since last tracing Confirmed by LIU MD, DANA [phone]) on 10/06/2015 8:10:32 PM       Radiology Dg Chest 2 View  Result Date: 10/06/2015 CLINICAL DATA:  50 year old female with weakness and dizziness. EXAM: CHEST  2 VIEW COMPARISON:  Chest radiograph dated 09/09/2014 FINDINGS: There is no focal consolidation, pleural effusion, or pneumothorax. The cardiac silhouette is within normal limits. No acute osseous pathology. IMPRESSION: No active cardiopulmonary disease.  Electronically Signed   By: Anner Crete M.D.   On: 10/06/2015 22:23   Ct Head Wo Contrast  Result Date: 10/06/2015 CLINICAL DATA:  50 year old female with dizziness, lightheadedness, and weakness. EXAM: CT HEAD WITHOUT CONTRAST TECHNIQUE: Contiguous axial images were obtained from the base of the skull through the vertex without intravenous contrast. COMPARISON:  None. FINDINGS: Brain: The ventricles and sulci appropriate size for patient's age. The  gray-white matter discrimination is preserved. There is no acute intracranial hemorrhage. No mass effect or midline shift noted. Vascular: Unremarkable. Skull: No fractures. Sinuses/Orbits: There is mild diffuse mucoperiosteal thickening of the right maxillary sinus. The remainder of the visualized paranasal sinuses and mastoid air cells are clear. Other: None IMPRESSION: No acute intracranial pathology. Electronically Signed   By: Anner Crete M.D.   On: 10/06/2015 23:38    Procedures Procedures (including critical care time)  Medications Ordered in ED Medications  sodium chloride 0.9 % bolus 1,000 mL (0 mLs Intravenous Stopped 10/07/15 0002)  ondansetron (ZOFRAN) injection 4 mg (4 mg Intravenous Given 10/06/15 2227)     Initial Impression / Assessment and Plan / ED Course  I have reviewed the triage vital signs and the nursing notes.  Pertinent labs & imaging results that were available during my care of the patient were reviewed by me and considered in my medical decision making (see chart for details).  Clinical Course   Patient seen and examined. Work-up initiated.   Vital signs reviewed and are as follows: BP 140/97 (BP Location: Right Arm)   Pulse 94   Temp 98.4 F (36.9 C) (Oral)   Resp 21   Ht 5\' 2"  (1.575 m)   Wt 73.5 kg   SpO2 100%   BMI 29.63 kg/m   12:11 AM Exhaustive evaluation undertaken tonight with limited positive findings. Discussed all results with patient at bedside. She does seem relieved. She states that  she is feeling better with treatment. Patient is on numerous medications which could all cause her to feel lightheaded, dizzy, weak. We discussed that she needs to sit down with her primary care physician and her psychiatrist to discuss these medications. I feel that she would benefit from simplification of her multiple sedating medications. Will not make any changes at this time, however stressed the importance of her primary care physicians being involved in these decisions. Patient seems very relieved. She states that she is comfortable with discharge to home at this time. I encouraged her return with worsening symptoms or any other concerns.   Final Clinical Impressions(s) / ED Diagnoses   Final diagnoses:  Weakness  Polypharmacy   Patient with generalized weakness and multiple complaints as documented above. Workup undertaken to evaluate these complaints. No emergent conditions identified tonight. I feel that given her negative workup, the most likely etiology is related to polypharmacy and being on several sedating medications. Encouraged follow-up with her physicians as above to discuss her medications and see if all of these are necessary.  No dangerous or life-threatening conditions suspected or identified by history, physical exam, and by work-up. No indications for hospitalization identified.    New Prescriptions New Prescriptions   No medications on file     Carlisle Cater, PA-C 10/07/15 0014    Forde Dandy, MD 10/07/15 [phone]

## 2015-10-06 NOTE — ED Triage Notes (Signed)
Pt reports that she has felt weak, dizzy, lightheaded, has high blood pressure, difficulty walking and chest pain x 1 month.  States her PCP will not do anything to help her.  Pt states she, "cannot stand."  However pt also reported that she is very careful when she walks up the stairs to her apartment.

## 2015-10-07 NOTE — Discharge Instructions (Signed)
Please read and follow all provided instructions.  Your diagnoses today include:  1. Weakness   2. Polypharmacy      Tests performed today include:  Blood counts and electrolytes  Thyroid test  Lithium level  Blood test for heart and EKG  CT of your brain  Chest x-ray  Vital signs. See below for your results today.   Medications prescribed:   None  Take any prescribed medications only as directed.  Home care instructions:  Follow any educational materials contained in this packet.  Follow-up instructions: Please follow-up with your primary care provider in the next 3 days for further evaluation of your symptoms. As we discussed, you  Need to talk to your doctor about the multiple medications that you are on. These may be affecting the way that you feel and causing the symptoms that you have been having over the past one month.  Return instructions:   Please return to the Emergency Department if you experience worsening symptoms.  Please return if you have any other emergent concerns.  Additional Information:  Your vital signs today were: BP 127/73    Pulse 86    Temp 98.4 F (36.9 C) (Oral)    Resp 20    Ht 5\' 2"  (1.575 m)    Wt 73.5 kg    SpO2 100%    BMI 29.63 kg/m  If your blood pressure (BP) was elevated above 135/85 this visit, please have this repeated by your doctor within one month. --------------

## 2015-10-07 NOTE — ED Notes (Signed)
Patient d/c'd self care.  F/U reviewed.  Patient verbalized understanding. 

## 2015-10-07 NOTE — ED Notes (Signed)
Patient ambulated steady gait

## 2016-01-08 ENCOUNTER — Other Ambulatory Visit: Payer: Self-pay | Admitting: Family Medicine

## 2016-01-09 ENCOUNTER — Other Ambulatory Visit: Payer: Self-pay | Admitting: Family Medicine

## 2016-01-09 DIAGNOSIS — M545 Low back pain, unspecified: Secondary | ICD-10-CM

## 2017-06-29 ENCOUNTER — Other Ambulatory Visit (HOSPITAL_BASED_OUTPATIENT_CLINIC_OR_DEPARTMENT_OTHER): Payer: Self-pay

## 2017-06-29 DIAGNOSIS — R0683 Snoring: Secondary | ICD-10-CM

## 2017-06-29 DIAGNOSIS — G4730 Sleep apnea, unspecified: Secondary | ICD-10-CM

## 2017-07-22 ENCOUNTER — Ambulatory Visit (HOSPITAL_BASED_OUTPATIENT_CLINIC_OR_DEPARTMENT_OTHER): Payer: Medicare Other | Attending: Internal Medicine

## 2017-08-18 ENCOUNTER — Emergency Department (HOSPITAL_COMMUNITY): Payer: Medicare HMO

## 2017-08-18 ENCOUNTER — Emergency Department (HOSPITAL_COMMUNITY)
Admission: EM | Admit: 2017-08-18 | Discharge: 2017-08-18 | Disposition: A | Discharge status: Home / Self Care | Payer: Medicare HMO | Attending: Emergency Medicine | Admitting: Emergency Medicine

## 2017-08-18 ENCOUNTER — Encounter (HOSPITAL_COMMUNITY): Payer: Self-pay

## 2017-08-18 DIAGNOSIS — Z79899 Other long term (current) drug therapy: Secondary | ICD-10-CM | POA: Diagnosis not present

## 2017-08-18 DIAGNOSIS — M546 Pain in thoracic spine: Secondary | ICD-10-CM | POA: Diagnosis not present

## 2017-08-18 DIAGNOSIS — Y92410 Unspecified street and highway as the place of occurrence of the external cause: Secondary | ICD-10-CM | POA: Diagnosis not present

## 2017-08-18 DIAGNOSIS — Y999 Unspecified external cause status: Secondary | ICD-10-CM | POA: Insufficient documentation

## 2017-08-18 DIAGNOSIS — E039 Hypothyroidism, unspecified: Secondary | ICD-10-CM | POA: Diagnosis not present

## 2017-08-18 DIAGNOSIS — S39012A Strain of muscle, fascia and tendon of lower back, initial encounter: Secondary | ICD-10-CM | POA: Diagnosis not present

## 2017-08-18 DIAGNOSIS — F17210 Nicotine dependence, cigarettes, uncomplicated: Secondary | ICD-10-CM | POA: Diagnosis not present

## 2017-08-18 DIAGNOSIS — Y9389 Activity, other specified: Secondary | ICD-10-CM | POA: Diagnosis not present

## 2017-08-18 DIAGNOSIS — S161XXA Strain of muscle, fascia and tendon at neck level, initial encounter: Secondary | ICD-10-CM | POA: Diagnosis not present

## 2017-08-18 DIAGNOSIS — S3982XA Other specified injuries of lower back, initial encounter: Secondary | ICD-10-CM | POA: Diagnosis present

## 2017-08-18 MED ORDER — CYCLOBENZAPRINE HCL 5 MG PO TABS: 5.0000 mg | ORAL_TABLET | Freq: Three times a day (TID) | ORAL | 0 refills | Status: AC | PRN

## 2017-08-18 MED ORDER — HYDROMORPHONE HCL 1 MG/ML IJ SOLN
1.0000 mg | Freq: Once | INTRAMUSCULAR | Status: DC
  Filled 2017-08-18: qty 1

## 2017-08-18 MED ORDER — OXYCODONE HCL 5 MG PO TABS
5.0000 mg | ORAL_TABLET | Freq: Once | ORAL | Status: AC
Start: 2017-08-18 — End: 2017-08-18
  Administered 2017-08-18: 5 mg via ORAL
  Filled 2017-08-18: qty 1

## 2017-08-18 MED ORDER — DIAZEPAM 5 MG PO TABS
5.0000 mg | ORAL_TABLET | Freq: Once | ORAL | Status: AC
  Administered 2017-08-18: 5 mg via ORAL
  Filled 2017-08-18: qty 1

## 2017-08-18 MED ORDER — ACETAMINOPHEN 500 MG PO TABS
1000.0000 mg | ORAL_TABLET | Freq: Once | ORAL | Status: AC
  Administered 2017-08-18: 1000 mg via ORAL
  Filled 2017-08-18: qty 2

## 2017-08-18 MED ORDER — KETOROLAC TROMETHAMINE 60 MG/2ML IM SOLN
15.0000 mg | Freq: Once | INTRAMUSCULAR | Status: AC
  Administered 2017-08-18: 15 mg via INTRAMUSCULAR
  Filled 2017-08-18: qty 2
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
## 2017-08-18 NOTE — ED Notes (Signed)
1mg  Dilaudid unopened wasted with Illene Silver, Charge RN per Ronalee Belts from pharmacy unable to return medication since pt is discharged and out of the [facility].
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
## 2017-08-18 NOTE — ED Notes (Signed)
PT ambulatory and has no s/sx of distress noted.

## 2017-08-18 NOTE — ED Notes (Signed)
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

## 2017-08-18 NOTE — ED Notes (Signed)
Patient transported to X-ray 

## 2017-08-18 NOTE — Discharge Instructions (Addendum)
Please take Tylenol 1000 mg every 8 hours for the pain. You can also take Ibuprofen 400 mg every 8 hours as needed for the pain. You can also take 1 flexeril as needed to relax your muscles and reduce pain.   If the pain continues after a week, please see your primary care doctor.

## 2020-04-30 DIAGNOSIS — M5417 Radiculopathy, lumbosacral region: Secondary | ICD-10-CM | POA: Diagnosis not present

## 2020-04-30 DIAGNOSIS — M1990 Unspecified osteoarthritis, unspecified site: Secondary | ICD-10-CM | POA: Diagnosis not present

## 2020-05-01 DIAGNOSIS — M5417 Radiculopathy, lumbosacral region: Secondary | ICD-10-CM | POA: Diagnosis not present

## 2020-05-01 DIAGNOSIS — M1990 Unspecified osteoarthritis, unspecified site: Secondary | ICD-10-CM | POA: Diagnosis not present

## 2020-05-02 DIAGNOSIS — M5417 Radiculopathy, lumbosacral region: Secondary | ICD-10-CM | POA: Diagnosis not present

## 2020-05-02 DIAGNOSIS — M1990 Unspecified osteoarthritis, unspecified site: Secondary | ICD-10-CM | POA: Diagnosis not present

## 2020-05-03 DIAGNOSIS — M5417 Radiculopathy, lumbosacral region: Secondary | ICD-10-CM | POA: Diagnosis not present

## 2020-05-03 DIAGNOSIS — M1990 Unspecified osteoarthritis, unspecified site: Secondary | ICD-10-CM | POA: Diagnosis not present

## 2020-05-04 DIAGNOSIS — M5417 Radiculopathy, lumbosacral region: Secondary | ICD-10-CM | POA: Diagnosis not present

## 2020-05-04 DIAGNOSIS — M1990 Unspecified osteoarthritis, unspecified site: Secondary | ICD-10-CM | POA: Diagnosis not present

## 2020-05-05 DIAGNOSIS — M1990 Unspecified osteoarthritis, unspecified site: Secondary | ICD-10-CM | POA: Diagnosis not present

## 2020-05-05 DIAGNOSIS — M5417 Radiculopathy, lumbosacral region: Secondary | ICD-10-CM | POA: Diagnosis not present

## 2020-05-06 DIAGNOSIS — M5417 Radiculopathy, lumbosacral region: Secondary | ICD-10-CM | POA: Diagnosis not present

## 2020-05-06 DIAGNOSIS — M1990 Unspecified osteoarthritis, unspecified site: Secondary | ICD-10-CM | POA: Diagnosis not present

## 2020-05-07 DIAGNOSIS — M5417 Radiculopathy, lumbosacral region: Secondary | ICD-10-CM | POA: Diagnosis not present

## 2020-05-07 DIAGNOSIS — M1990 Unspecified osteoarthritis, unspecified site: Secondary | ICD-10-CM | POA: Diagnosis not present

## 2020-05-08 DIAGNOSIS — M1990 Unspecified osteoarthritis, unspecified site: Secondary | ICD-10-CM | POA: Diagnosis not present

## 2020-05-08 DIAGNOSIS — M5417 Radiculopathy, lumbosacral region: Secondary | ICD-10-CM | POA: Diagnosis not present

## 2020-05-09 DIAGNOSIS — M1990 Unspecified osteoarthritis, unspecified site: Secondary | ICD-10-CM | POA: Diagnosis not present

## 2020-05-09 DIAGNOSIS — M5417 Radiculopathy, lumbosacral region: Secondary | ICD-10-CM | POA: Diagnosis not present

## 2020-05-10 DIAGNOSIS — M5417 Radiculopathy, lumbosacral region: Secondary | ICD-10-CM | POA: Diagnosis not present

## 2020-05-10 DIAGNOSIS — M1990 Unspecified osteoarthritis, unspecified site: Secondary | ICD-10-CM | POA: Diagnosis not present

## 2020-05-11 DIAGNOSIS — M1990 Unspecified osteoarthritis, unspecified site: Secondary | ICD-10-CM | POA: Diagnosis not present

## 2020-05-11 DIAGNOSIS — M5417 Radiculopathy, lumbosacral region: Secondary | ICD-10-CM | POA: Diagnosis not present

## 2020-05-12 DIAGNOSIS — M5417 Radiculopathy, lumbosacral region: Secondary | ICD-10-CM | POA: Diagnosis not present

## 2020-05-12 DIAGNOSIS — M1990 Unspecified osteoarthritis, unspecified site: Secondary | ICD-10-CM | POA: Diagnosis not present

## 2020-05-13 DIAGNOSIS — M5417 Radiculopathy, lumbosacral region: Secondary | ICD-10-CM | POA: Diagnosis not present

## 2020-05-13 DIAGNOSIS — M1990 Unspecified osteoarthritis, unspecified site: Secondary | ICD-10-CM | POA: Diagnosis not present

## 2020-05-14 DIAGNOSIS — M1990 Unspecified osteoarthritis, unspecified site: Secondary | ICD-10-CM | POA: Diagnosis not present

## 2020-05-14 DIAGNOSIS — M5417 Radiculopathy, lumbosacral region: Secondary | ICD-10-CM | POA: Diagnosis not present

## 2020-05-15 DIAGNOSIS — M5417 Radiculopathy, lumbosacral region: Secondary | ICD-10-CM | POA: Diagnosis not present

## 2020-05-15 DIAGNOSIS — M1990 Unspecified osteoarthritis, unspecified site: Secondary | ICD-10-CM | POA: Diagnosis not present

## 2020-05-16 DIAGNOSIS — M5417 Radiculopathy, lumbosacral region: Secondary | ICD-10-CM | POA: Diagnosis not present

## 2020-05-16 DIAGNOSIS — M1990 Unspecified osteoarthritis, unspecified site: Secondary | ICD-10-CM | POA: Diagnosis not present

## 2020-05-17 DIAGNOSIS — M5417 Radiculopathy, lumbosacral region: Secondary | ICD-10-CM | POA: Diagnosis not present

## 2020-05-17 DIAGNOSIS — M1990 Unspecified osteoarthritis, unspecified site: Secondary | ICD-10-CM | POA: Diagnosis not present

## 2020-05-18 DIAGNOSIS — M5417 Radiculopathy, lumbosacral region: Secondary | ICD-10-CM | POA: Diagnosis not present

## 2020-05-18 DIAGNOSIS — M1990 Unspecified osteoarthritis, unspecified site: Secondary | ICD-10-CM | POA: Diagnosis not present

## 2020-05-19 DIAGNOSIS — M5417 Radiculopathy, lumbosacral region: Secondary | ICD-10-CM | POA: Diagnosis not present

## 2020-05-19 DIAGNOSIS — M1990 Unspecified osteoarthritis, unspecified site: Secondary | ICD-10-CM | POA: Diagnosis not present

## 2020-05-20 DIAGNOSIS — M5417 Radiculopathy, lumbosacral region: Secondary | ICD-10-CM | POA: Diagnosis not present

## 2020-05-20 DIAGNOSIS — M1990 Unspecified osteoarthritis, unspecified site: Secondary | ICD-10-CM | POA: Diagnosis not present

## 2020-05-21 DIAGNOSIS — M1990 Unspecified osteoarthritis, unspecified site: Secondary | ICD-10-CM | POA: Diagnosis not present

## 2020-05-21 DIAGNOSIS — M5417 Radiculopathy, lumbosacral region: Secondary | ICD-10-CM | POA: Diagnosis not present

## 2020-05-22 DIAGNOSIS — M1990 Unspecified osteoarthritis, unspecified site: Secondary | ICD-10-CM | POA: Diagnosis not present

## 2020-05-22 DIAGNOSIS — M5417 Radiculopathy, lumbosacral region: Secondary | ICD-10-CM | POA: Diagnosis not present

## 2020-05-23 DIAGNOSIS — M5417 Radiculopathy, lumbosacral region: Secondary | ICD-10-CM | POA: Diagnosis not present

## 2020-05-23 DIAGNOSIS — M1990 Unspecified osteoarthritis, unspecified site: Secondary | ICD-10-CM | POA: Diagnosis not present

## 2020-05-24 DIAGNOSIS — M5417 Radiculopathy, lumbosacral region: Secondary | ICD-10-CM | POA: Diagnosis not present

## 2020-05-24 DIAGNOSIS — M1990 Unspecified osteoarthritis, unspecified site: Secondary | ICD-10-CM | POA: Diagnosis not present

## 2020-05-25 DIAGNOSIS — M1990 Unspecified osteoarthritis, unspecified site: Secondary | ICD-10-CM | POA: Diagnosis not present

## 2020-05-25 DIAGNOSIS — M5417 Radiculopathy, lumbosacral region: Secondary | ICD-10-CM | POA: Diagnosis not present

## 2020-05-26 DIAGNOSIS — M5417 Radiculopathy, lumbosacral region: Secondary | ICD-10-CM | POA: Diagnosis not present

## 2020-05-26 DIAGNOSIS — M1990 Unspecified osteoarthritis, unspecified site: Secondary | ICD-10-CM | POA: Diagnosis not present

## 2020-05-27 DIAGNOSIS — M5417 Radiculopathy, lumbosacral region: Secondary | ICD-10-CM | POA: Diagnosis not present

## 2020-05-27 DIAGNOSIS — M1990 Unspecified osteoarthritis, unspecified site: Secondary | ICD-10-CM | POA: Diagnosis not present

## 2020-05-28 DIAGNOSIS — M5417 Radiculopathy, lumbosacral region: Secondary | ICD-10-CM | POA: Diagnosis not present

## 2020-05-28 DIAGNOSIS — M1990 Unspecified osteoarthritis, unspecified site: Secondary | ICD-10-CM | POA: Diagnosis not present

## 2020-05-29 DIAGNOSIS — M5417 Radiculopathy, lumbosacral region: Secondary | ICD-10-CM | POA: Diagnosis not present

## 2020-05-29 DIAGNOSIS — M1990 Unspecified osteoarthritis, unspecified site: Secondary | ICD-10-CM | POA: Diagnosis not present

## 2020-05-30 DIAGNOSIS — M1990 Unspecified osteoarthritis, unspecified site: Secondary | ICD-10-CM | POA: Diagnosis not present

## 2020-05-30 DIAGNOSIS — M5417 Radiculopathy, lumbosacral region: Secondary | ICD-10-CM | POA: Diagnosis not present

## 2020-05-31 DIAGNOSIS — M1990 Unspecified osteoarthritis, unspecified site: Secondary | ICD-10-CM | POA: Diagnosis not present

## 2020-05-31 DIAGNOSIS — M5417 Radiculopathy, lumbosacral region: Secondary | ICD-10-CM | POA: Diagnosis not present

## 2020-06-01 DIAGNOSIS — M1990 Unspecified osteoarthritis, unspecified site: Secondary | ICD-10-CM | POA: Diagnosis not present

## 2020-06-01 DIAGNOSIS — M5417 Radiculopathy, lumbosacral region: Secondary | ICD-10-CM | POA: Diagnosis not present

## 2020-06-02 DIAGNOSIS — M5417 Radiculopathy, lumbosacral region: Secondary | ICD-10-CM | POA: Diagnosis not present

## 2020-06-02 DIAGNOSIS — M1990 Unspecified osteoarthritis, unspecified site: Secondary | ICD-10-CM | POA: Diagnosis not present

## 2020-06-03 DIAGNOSIS — M5417 Radiculopathy, lumbosacral region: Secondary | ICD-10-CM | POA: Diagnosis not present

## 2020-06-03 DIAGNOSIS — M1990 Unspecified osteoarthritis, unspecified site: Secondary | ICD-10-CM | POA: Diagnosis not present

## 2020-06-04 DIAGNOSIS — M1990 Unspecified osteoarthritis, unspecified site: Secondary | ICD-10-CM | POA: Diagnosis not present

## 2020-06-04 DIAGNOSIS — M5417 Radiculopathy, lumbosacral region: Secondary | ICD-10-CM | POA: Diagnosis not present

## 2020-06-05 DIAGNOSIS — M5417 Radiculopathy, lumbosacral region: Secondary | ICD-10-CM | POA: Diagnosis not present

## 2020-06-05 DIAGNOSIS — M1990 Unspecified osteoarthritis, unspecified site: Secondary | ICD-10-CM | POA: Diagnosis not present

## 2020-06-06 DIAGNOSIS — M1990 Unspecified osteoarthritis, unspecified site: Secondary | ICD-10-CM | POA: Diagnosis not present

## 2020-06-06 DIAGNOSIS — M5417 Radiculopathy, lumbosacral region: Secondary | ICD-10-CM | POA: Diagnosis not present

## 2020-06-07 DIAGNOSIS — M5417 Radiculopathy, lumbosacral region: Secondary | ICD-10-CM | POA: Diagnosis not present

## 2020-06-07 DIAGNOSIS — M1990 Unspecified osteoarthritis, unspecified site: Secondary | ICD-10-CM | POA: Diagnosis not present

## 2020-06-08 DIAGNOSIS — M1990 Unspecified osteoarthritis, unspecified site: Secondary | ICD-10-CM | POA: Diagnosis not present

## 2020-06-08 DIAGNOSIS — M5417 Radiculopathy, lumbosacral region: Secondary | ICD-10-CM | POA: Diagnosis not present

## 2020-06-09 DIAGNOSIS — M1990 Unspecified osteoarthritis, unspecified site: Secondary | ICD-10-CM | POA: Diagnosis not present

## 2020-06-09 DIAGNOSIS — M5417 Radiculopathy, lumbosacral region: Secondary | ICD-10-CM | POA: Diagnosis not present

## 2020-06-10 DIAGNOSIS — M5417 Radiculopathy, lumbosacral region: Secondary | ICD-10-CM | POA: Diagnosis not present

## 2020-06-10 DIAGNOSIS — M1990 Unspecified osteoarthritis, unspecified site: Secondary | ICD-10-CM | POA: Diagnosis not present

## 2020-06-11 DIAGNOSIS — M5417 Radiculopathy, lumbosacral region: Secondary | ICD-10-CM | POA: Diagnosis not present

## 2020-06-11 DIAGNOSIS — M1990 Unspecified osteoarthritis, unspecified site: Secondary | ICD-10-CM | POA: Diagnosis not present

## 2020-06-12 DIAGNOSIS — M5417 Radiculopathy, lumbosacral region: Secondary | ICD-10-CM | POA: Diagnosis not present

## 2020-06-12 DIAGNOSIS — M1990 Unspecified osteoarthritis, unspecified site: Secondary | ICD-10-CM | POA: Diagnosis not present

## 2020-06-13 DIAGNOSIS — M1990 Unspecified osteoarthritis, unspecified site: Secondary | ICD-10-CM | POA: Diagnosis not present

## 2020-06-13 DIAGNOSIS — M5417 Radiculopathy, lumbosacral region: Secondary | ICD-10-CM | POA: Diagnosis not present

## 2020-06-14 DIAGNOSIS — M5417 Radiculopathy, lumbosacral region: Secondary | ICD-10-CM | POA: Diagnosis not present

## 2020-06-14 DIAGNOSIS — M1990 Unspecified osteoarthritis, unspecified site: Secondary | ICD-10-CM | POA: Diagnosis not present

## 2020-06-15 DIAGNOSIS — M5417 Radiculopathy, lumbosacral region: Secondary | ICD-10-CM | POA: Diagnosis not present

## 2020-06-15 DIAGNOSIS — M1990 Unspecified osteoarthritis, unspecified site: Secondary | ICD-10-CM | POA: Diagnosis not present

## 2020-06-16 DIAGNOSIS — M1990 Unspecified osteoarthritis, unspecified site: Secondary | ICD-10-CM | POA: Diagnosis not present

## 2020-06-16 DIAGNOSIS — M5417 Radiculopathy, lumbosacral region: Secondary | ICD-10-CM | POA: Diagnosis not present

## 2020-06-17 DIAGNOSIS — M1990 Unspecified osteoarthritis, unspecified site: Secondary | ICD-10-CM | POA: Diagnosis not present

## 2020-06-17 DIAGNOSIS — M5417 Radiculopathy, lumbosacral region: Secondary | ICD-10-CM | POA: Diagnosis not present

## 2020-06-18 DIAGNOSIS — M1990 Unspecified osteoarthritis, unspecified site: Secondary | ICD-10-CM | POA: Diagnosis not present

## 2020-06-18 DIAGNOSIS — M5417 Radiculopathy, lumbosacral region: Secondary | ICD-10-CM | POA: Diagnosis not present

## 2020-06-19 DIAGNOSIS — M5417 Radiculopathy, lumbosacral region: Secondary | ICD-10-CM | POA: Diagnosis not present

## 2020-06-19 DIAGNOSIS — M1990 Unspecified osteoarthritis, unspecified site: Secondary | ICD-10-CM | POA: Diagnosis not present

## 2020-06-20 DIAGNOSIS — M5417 Radiculopathy, lumbosacral region: Secondary | ICD-10-CM | POA: Diagnosis not present

## 2020-06-20 DIAGNOSIS — M1990 Unspecified osteoarthritis, unspecified site: Secondary | ICD-10-CM | POA: Diagnosis not present

## 2020-06-21 DIAGNOSIS — M5417 Radiculopathy, lumbosacral region: Secondary | ICD-10-CM | POA: Diagnosis not present

## 2020-06-21 DIAGNOSIS — M1990 Unspecified osteoarthritis, unspecified site: Secondary | ICD-10-CM | POA: Diagnosis not present

## 2020-06-22 DIAGNOSIS — M1990 Unspecified osteoarthritis, unspecified site: Secondary | ICD-10-CM | POA: Diagnosis not present

## 2020-06-22 DIAGNOSIS — M5417 Radiculopathy, lumbosacral region: Secondary | ICD-10-CM | POA: Diagnosis not present

## 2020-06-23 DIAGNOSIS — M5417 Radiculopathy, lumbosacral region: Secondary | ICD-10-CM | POA: Diagnosis not present

## 2020-06-23 DIAGNOSIS — M1990 Unspecified osteoarthritis, unspecified site: Secondary | ICD-10-CM | POA: Diagnosis not present

## 2020-06-24 DIAGNOSIS — M1990 Unspecified osteoarthritis, unspecified site: Secondary | ICD-10-CM | POA: Diagnosis not present

## 2020-06-24 DIAGNOSIS — M5417 Radiculopathy, lumbosacral region: Secondary | ICD-10-CM | POA: Diagnosis not present

## 2020-06-25 DIAGNOSIS — M5417 Radiculopathy, lumbosacral region: Secondary | ICD-10-CM | POA: Diagnosis not present

## 2020-06-25 DIAGNOSIS — M1990 Unspecified osteoarthritis, unspecified site: Secondary | ICD-10-CM | POA: Diagnosis not present

## 2020-06-26 DIAGNOSIS — M1990 Unspecified osteoarthritis, unspecified site: Secondary | ICD-10-CM | POA: Diagnosis not present

## 2020-06-26 DIAGNOSIS — M5417 Radiculopathy, lumbosacral region: Secondary | ICD-10-CM | POA: Diagnosis not present

## 2020-06-27 DIAGNOSIS — M5417 Radiculopathy, lumbosacral region: Secondary | ICD-10-CM | POA: Diagnosis not present

## 2020-06-27 DIAGNOSIS — M1990 Unspecified osteoarthritis, unspecified site: Secondary | ICD-10-CM | POA: Diagnosis not present

## 2020-06-28 DIAGNOSIS — M5417 Radiculopathy, lumbosacral region: Secondary | ICD-10-CM | POA: Diagnosis not present

## 2020-06-28 DIAGNOSIS — M1990 Unspecified osteoarthritis, unspecified site: Secondary | ICD-10-CM | POA: Diagnosis not present

## 2020-06-29 DIAGNOSIS — M1990 Unspecified osteoarthritis, unspecified site: Secondary | ICD-10-CM | POA: Diagnosis not present

## 2020-06-29 DIAGNOSIS — M5417 Radiculopathy, lumbosacral region: Secondary | ICD-10-CM | POA: Diagnosis not present

## 2020-06-30 DIAGNOSIS — M5417 Radiculopathy, lumbosacral region: Secondary | ICD-10-CM | POA: Diagnosis not present

## 2020-06-30 DIAGNOSIS — M1990 Unspecified osteoarthritis, unspecified site: Secondary | ICD-10-CM | POA: Diagnosis not present

## 2020-07-01 DIAGNOSIS — M5417 Radiculopathy, lumbosacral region: Secondary | ICD-10-CM | POA: Diagnosis not present

## 2020-07-01 DIAGNOSIS — M1990 Unspecified osteoarthritis, unspecified site: Secondary | ICD-10-CM | POA: Diagnosis not present

## 2020-07-02 DIAGNOSIS — M1990 Unspecified osteoarthritis, unspecified site: Secondary | ICD-10-CM | POA: Diagnosis not present

## 2020-07-02 DIAGNOSIS — M5417 Radiculopathy, lumbosacral region: Secondary | ICD-10-CM | POA: Diagnosis not present

## 2020-07-03 DIAGNOSIS — M5417 Radiculopathy, lumbosacral region: Secondary | ICD-10-CM | POA: Diagnosis not present

## 2020-07-03 DIAGNOSIS — M1990 Unspecified osteoarthritis, unspecified site: Secondary | ICD-10-CM | POA: Diagnosis not present

## 2020-07-04 DIAGNOSIS — M5417 Radiculopathy, lumbosacral region: Secondary | ICD-10-CM | POA: Diagnosis not present

## 2020-07-04 DIAGNOSIS — M1990 Unspecified osteoarthritis, unspecified site: Secondary | ICD-10-CM | POA: Diagnosis not present

## 2020-07-05 DIAGNOSIS — M1990 Unspecified osteoarthritis, unspecified site: Secondary | ICD-10-CM | POA: Diagnosis not present

## 2020-07-05 DIAGNOSIS — M5417 Radiculopathy, lumbosacral region: Secondary | ICD-10-CM | POA: Diagnosis not present

## 2020-07-06 DIAGNOSIS — M5417 Radiculopathy, lumbosacral region: Secondary | ICD-10-CM | POA: Diagnosis not present

## 2020-07-06 DIAGNOSIS — M1990 Unspecified osteoarthritis, unspecified site: Secondary | ICD-10-CM | POA: Diagnosis not present

## 2020-07-07 DIAGNOSIS — M5417 Radiculopathy, lumbosacral region: Secondary | ICD-10-CM | POA: Diagnosis not present

## 2020-07-07 DIAGNOSIS — M1990 Unspecified osteoarthritis, unspecified site: Secondary | ICD-10-CM | POA: Diagnosis not present

## 2020-07-08 DIAGNOSIS — M1990 Unspecified osteoarthritis, unspecified site: Secondary | ICD-10-CM | POA: Diagnosis not present

## 2020-07-08 DIAGNOSIS — M5417 Radiculopathy, lumbosacral region: Secondary | ICD-10-CM | POA: Diagnosis not present

## 2020-07-09 DIAGNOSIS — M1990 Unspecified osteoarthritis, unspecified site: Secondary | ICD-10-CM | POA: Diagnosis not present

## 2020-07-09 DIAGNOSIS — M5417 Radiculopathy, lumbosacral region: Secondary | ICD-10-CM | POA: Diagnosis not present

## 2020-07-10 DIAGNOSIS — M1990 Unspecified osteoarthritis, unspecified site: Secondary | ICD-10-CM | POA: Diagnosis not present

## 2020-07-10 DIAGNOSIS — M5417 Radiculopathy, lumbosacral region: Secondary | ICD-10-CM | POA: Diagnosis not present

## 2020-07-11 DIAGNOSIS — M5417 Radiculopathy, lumbosacral region: Secondary | ICD-10-CM | POA: Diagnosis not present

## 2020-07-11 DIAGNOSIS — M1990 Unspecified osteoarthritis, unspecified site: Secondary | ICD-10-CM | POA: Diagnosis not present

## 2020-07-12 DIAGNOSIS — M5417 Radiculopathy, lumbosacral region: Secondary | ICD-10-CM | POA: Diagnosis not present

## 2020-07-12 DIAGNOSIS — M1990 Unspecified osteoarthritis, unspecified site: Secondary | ICD-10-CM | POA: Diagnosis not present

## 2020-07-13 DIAGNOSIS — M5417 Radiculopathy, lumbosacral region: Secondary | ICD-10-CM | POA: Diagnosis not present

## 2020-07-13 DIAGNOSIS — M1990 Unspecified osteoarthritis, unspecified site: Secondary | ICD-10-CM | POA: Diagnosis not present

## 2020-07-14 DIAGNOSIS — M5417 Radiculopathy, lumbosacral region: Secondary | ICD-10-CM | POA: Diagnosis not present

## 2020-07-14 DIAGNOSIS — M1990 Unspecified osteoarthritis, unspecified site: Secondary | ICD-10-CM | POA: Diagnosis not present

## 2020-07-15 DIAGNOSIS — M1990 Unspecified osteoarthritis, unspecified site: Secondary | ICD-10-CM | POA: Diagnosis not present

## 2020-07-15 DIAGNOSIS — M5417 Radiculopathy, lumbosacral region: Secondary | ICD-10-CM | POA: Diagnosis not present

## 2020-07-16 DIAGNOSIS — M1990 Unspecified osteoarthritis, unspecified site: Secondary | ICD-10-CM | POA: Diagnosis not present

## 2020-07-16 DIAGNOSIS — M5417 Radiculopathy, lumbosacral region: Secondary | ICD-10-CM | POA: Diagnosis not present

## 2020-07-17 DIAGNOSIS — M5417 Radiculopathy, lumbosacral region: Secondary | ICD-10-CM | POA: Diagnosis not present

## 2020-07-17 DIAGNOSIS — M1990 Unspecified osteoarthritis, unspecified site: Secondary | ICD-10-CM | POA: Diagnosis not present

## 2020-07-18 DIAGNOSIS — M5417 Radiculopathy, lumbosacral region: Secondary | ICD-10-CM | POA: Diagnosis not present

## 2020-07-18 DIAGNOSIS — M1990 Unspecified osteoarthritis, unspecified site: Secondary | ICD-10-CM | POA: Diagnosis not present

## 2020-07-19 DIAGNOSIS — M5417 Radiculopathy, lumbosacral region: Secondary | ICD-10-CM | POA: Diagnosis not present

## 2020-07-19 DIAGNOSIS — M1990 Unspecified osteoarthritis, unspecified site: Secondary | ICD-10-CM | POA: Diagnosis not present

## 2020-07-20 DIAGNOSIS — M1990 Unspecified osteoarthritis, unspecified site: Secondary | ICD-10-CM | POA: Diagnosis not present

## 2020-07-20 DIAGNOSIS — M5417 Radiculopathy, lumbosacral region: Secondary | ICD-10-CM | POA: Diagnosis not present

## 2020-07-21 DIAGNOSIS — M5417 Radiculopathy, lumbosacral region: Secondary | ICD-10-CM | POA: Diagnosis not present

## 2020-07-21 DIAGNOSIS — M1990 Unspecified osteoarthritis, unspecified site: Secondary | ICD-10-CM | POA: Diagnosis not present

## 2020-07-22 DIAGNOSIS — M1990 Unspecified osteoarthritis, unspecified site: Secondary | ICD-10-CM | POA: Diagnosis not present

## 2020-07-22 DIAGNOSIS — M5417 Radiculopathy, lumbosacral region: Secondary | ICD-10-CM | POA: Diagnosis not present

## 2020-07-23 DIAGNOSIS — M1990 Unspecified osteoarthritis, unspecified site: Secondary | ICD-10-CM | POA: Diagnosis not present

## 2020-07-23 DIAGNOSIS — M5417 Radiculopathy, lumbosacral region: Secondary | ICD-10-CM | POA: Diagnosis not present

## 2020-07-24 DIAGNOSIS — M5417 Radiculopathy, lumbosacral region: Secondary | ICD-10-CM | POA: Diagnosis not present

## 2020-07-24 DIAGNOSIS — M1990 Unspecified osteoarthritis, unspecified site: Secondary | ICD-10-CM | POA: Diagnosis not present

## 2020-07-25 DIAGNOSIS — M5417 Radiculopathy, lumbosacral region: Secondary | ICD-10-CM | POA: Diagnosis not present

## 2020-07-25 DIAGNOSIS — M1990 Unspecified osteoarthritis, unspecified site: Secondary | ICD-10-CM | POA: Diagnosis not present

## 2020-07-26 DIAGNOSIS — M5417 Radiculopathy, lumbosacral region: Secondary | ICD-10-CM | POA: Diagnosis not present

## 2020-07-26 DIAGNOSIS — M1990 Unspecified osteoarthritis, unspecified site: Secondary | ICD-10-CM | POA: Diagnosis not present

## 2020-07-27 DIAGNOSIS — M1990 Unspecified osteoarthritis, unspecified site: Secondary | ICD-10-CM | POA: Diagnosis not present

## 2020-07-27 DIAGNOSIS — M5417 Radiculopathy, lumbosacral region: Secondary | ICD-10-CM | POA: Diagnosis not present

## 2020-07-28 DIAGNOSIS — M1990 Unspecified osteoarthritis, unspecified site: Secondary | ICD-10-CM | POA: Diagnosis not present

## 2020-07-28 DIAGNOSIS — M5417 Radiculopathy, lumbosacral region: Secondary | ICD-10-CM | POA: Diagnosis not present

## 2020-07-29 DIAGNOSIS — M5417 Radiculopathy, lumbosacral region: Secondary | ICD-10-CM | POA: Diagnosis not present

## 2020-07-29 DIAGNOSIS — M1990 Unspecified osteoarthritis, unspecified site: Secondary | ICD-10-CM | POA: Diagnosis not present

## 2020-07-30 DIAGNOSIS — M1990 Unspecified osteoarthritis, unspecified site: Secondary | ICD-10-CM | POA: Diagnosis not present

## 2020-07-30 DIAGNOSIS — M5417 Radiculopathy, lumbosacral region: Secondary | ICD-10-CM | POA: Diagnosis not present

## 2020-07-31 DIAGNOSIS — M5417 Radiculopathy, lumbosacral region: Secondary | ICD-10-CM | POA: Diagnosis not present

## 2020-07-31 DIAGNOSIS — M1990 Unspecified osteoarthritis, unspecified site: Secondary | ICD-10-CM | POA: Diagnosis not present

## 2020-08-01 DIAGNOSIS — M1990 Unspecified osteoarthritis, unspecified site: Secondary | ICD-10-CM | POA: Diagnosis not present

## 2020-08-01 DIAGNOSIS — M5417 Radiculopathy, lumbosacral region: Secondary | ICD-10-CM | POA: Diagnosis not present

## 2020-08-02 DIAGNOSIS — M5417 Radiculopathy, lumbosacral region: Secondary | ICD-10-CM | POA: Diagnosis not present

## 2020-08-02 DIAGNOSIS — M1990 Unspecified osteoarthritis, unspecified site: Secondary | ICD-10-CM | POA: Diagnosis not present

## 2020-08-03 DIAGNOSIS — M1990 Unspecified osteoarthritis, unspecified site: Secondary | ICD-10-CM | POA: Diagnosis not present

## 2020-08-03 DIAGNOSIS — M5417 Radiculopathy, lumbosacral region: Secondary | ICD-10-CM | POA: Diagnosis not present

## 2020-08-04 DIAGNOSIS — M5417 Radiculopathy, lumbosacral region: Secondary | ICD-10-CM | POA: Diagnosis not present

## 2020-08-04 DIAGNOSIS — M1990 Unspecified osteoarthritis, unspecified site: Secondary | ICD-10-CM | POA: Diagnosis not present

## 2020-08-05 DIAGNOSIS — M1990 Unspecified osteoarthritis, unspecified site: Secondary | ICD-10-CM | POA: Diagnosis not present

## 2020-08-05 DIAGNOSIS — M5417 Radiculopathy, lumbosacral region: Secondary | ICD-10-CM | POA: Diagnosis not present

## 2020-08-06 DIAGNOSIS — M1990 Unspecified osteoarthritis, unspecified site: Secondary | ICD-10-CM | POA: Diagnosis not present

## 2020-08-06 DIAGNOSIS — M5417 Radiculopathy, lumbosacral region: Secondary | ICD-10-CM | POA: Diagnosis not present

## 2020-08-07 DIAGNOSIS — M1990 Unspecified osteoarthritis, unspecified site: Secondary | ICD-10-CM | POA: Diagnosis not present

## 2020-08-07 DIAGNOSIS — M5417 Radiculopathy, lumbosacral region: Secondary | ICD-10-CM | POA: Diagnosis not present

## 2020-08-08 DIAGNOSIS — M1990 Unspecified osteoarthritis, unspecified site: Secondary | ICD-10-CM | POA: Diagnosis not present

## 2020-08-08 DIAGNOSIS — M5417 Radiculopathy, lumbosacral region: Secondary | ICD-10-CM | POA: Diagnosis not present

## 2020-08-09 DIAGNOSIS — M1990 Unspecified osteoarthritis, unspecified site: Secondary | ICD-10-CM | POA: Diagnosis not present

## 2020-08-09 DIAGNOSIS — M5417 Radiculopathy, lumbosacral region: Secondary | ICD-10-CM | POA: Diagnosis not present

## 2020-08-10 DIAGNOSIS — M1990 Unspecified osteoarthritis, unspecified site: Secondary | ICD-10-CM | POA: Diagnosis not present

## 2020-08-10 DIAGNOSIS — M5417 Radiculopathy, lumbosacral region: Secondary | ICD-10-CM | POA: Diagnosis not present

## 2020-08-11 DIAGNOSIS — M1990 Unspecified osteoarthritis, unspecified site: Secondary | ICD-10-CM | POA: Diagnosis not present

## 2020-08-11 DIAGNOSIS — M5417 Radiculopathy, lumbosacral region: Secondary | ICD-10-CM | POA: Diagnosis not present

## 2020-08-12 DIAGNOSIS — M5417 Radiculopathy, lumbosacral region: Secondary | ICD-10-CM | POA: Diagnosis not present

## 2020-08-12 DIAGNOSIS — M1990 Unspecified osteoarthritis, unspecified site: Secondary | ICD-10-CM | POA: Diagnosis not present

## 2020-08-13 DIAGNOSIS — M1990 Unspecified osteoarthritis, unspecified site: Secondary | ICD-10-CM | POA: Diagnosis not present

## 2020-08-13 DIAGNOSIS — M5417 Radiculopathy, lumbosacral region: Secondary | ICD-10-CM | POA: Diagnosis not present

## 2020-08-14 DIAGNOSIS — M1990 Unspecified osteoarthritis, unspecified site: Secondary | ICD-10-CM | POA: Diagnosis not present

## 2020-08-14 DIAGNOSIS — M5417 Radiculopathy, lumbosacral region: Secondary | ICD-10-CM | POA: Diagnosis not present

## 2020-08-15 DIAGNOSIS — M1990 Unspecified osteoarthritis, unspecified site: Secondary | ICD-10-CM | POA: Diagnosis not present

## 2020-08-15 DIAGNOSIS — M5417 Radiculopathy, lumbosacral region: Secondary | ICD-10-CM | POA: Diagnosis not present

## 2020-08-16 DIAGNOSIS — M1990 Unspecified osteoarthritis, unspecified site: Secondary | ICD-10-CM | POA: Diagnosis not present

## 2020-08-16 DIAGNOSIS — M5417 Radiculopathy, lumbosacral region: Secondary | ICD-10-CM | POA: Diagnosis not present

## 2020-08-17 DIAGNOSIS — M5417 Radiculopathy, lumbosacral region: Secondary | ICD-10-CM | POA: Diagnosis not present

## 2020-08-17 DIAGNOSIS — M1990 Unspecified osteoarthritis, unspecified site: Secondary | ICD-10-CM | POA: Diagnosis not present

## 2020-08-18 DIAGNOSIS — M5417 Radiculopathy, lumbosacral region: Secondary | ICD-10-CM | POA: Diagnosis not present

## 2020-08-18 DIAGNOSIS — M1990 Unspecified osteoarthritis, unspecified site: Secondary | ICD-10-CM | POA: Diagnosis not present

## 2020-08-19 DIAGNOSIS — M5417 Radiculopathy, lumbosacral region: Secondary | ICD-10-CM | POA: Diagnosis not present

## 2020-08-19 DIAGNOSIS — M1990 Unspecified osteoarthritis, unspecified site: Secondary | ICD-10-CM | POA: Diagnosis not present

## 2020-08-20 DIAGNOSIS — M5417 Radiculopathy, lumbosacral region: Secondary | ICD-10-CM | POA: Diagnosis not present

## 2020-08-20 DIAGNOSIS — M1990 Unspecified osteoarthritis, unspecified site: Secondary | ICD-10-CM | POA: Diagnosis not present

## 2020-08-21 DIAGNOSIS — M1990 Unspecified osteoarthritis, unspecified site: Secondary | ICD-10-CM | POA: Diagnosis not present

## 2020-08-21 DIAGNOSIS — M5417 Radiculopathy, lumbosacral region: Secondary | ICD-10-CM | POA: Diagnosis not present

## 2020-08-22 DIAGNOSIS — M5417 Radiculopathy, lumbosacral region: Secondary | ICD-10-CM | POA: Diagnosis not present

## 2020-08-22 DIAGNOSIS — M1990 Unspecified osteoarthritis, unspecified site: Secondary | ICD-10-CM | POA: Diagnosis not present

## 2020-08-23 DIAGNOSIS — M1990 Unspecified osteoarthritis, unspecified site: Secondary | ICD-10-CM | POA: Diagnosis not present

## 2020-08-23 DIAGNOSIS — M5417 Radiculopathy, lumbosacral region: Secondary | ICD-10-CM | POA: Diagnosis not present

## 2020-08-24 DIAGNOSIS — M1990 Unspecified osteoarthritis, unspecified site: Secondary | ICD-10-CM | POA: Diagnosis not present

## 2020-08-24 DIAGNOSIS — M5417 Radiculopathy, lumbosacral region: Secondary | ICD-10-CM | POA: Diagnosis not present

## 2020-08-25 DIAGNOSIS — M1990 Unspecified osteoarthritis, unspecified site: Secondary | ICD-10-CM | POA: Diagnosis not present

## 2020-08-25 DIAGNOSIS — M5417 Radiculopathy, lumbosacral region: Secondary | ICD-10-CM | POA: Diagnosis not present

## 2020-08-26 DIAGNOSIS — M5417 Radiculopathy, lumbosacral region: Secondary | ICD-10-CM | POA: Diagnosis not present

## 2020-08-26 DIAGNOSIS — M1990 Unspecified osteoarthritis, unspecified site: Secondary | ICD-10-CM | POA: Diagnosis not present

## 2021-10-02 ENCOUNTER — Other Ambulatory Visit: Payer: Self-pay | Admitting: Internal Medicine

## 2021-10-02 DIAGNOSIS — Z1231 Encounter for screening mammogram for malignant neoplasm of breast: Secondary | ICD-10-CM

## 2021-10-02 DIAGNOSIS — Z122 Encounter for screening for malignant neoplasm of respiratory organs: Secondary | ICD-10-CM

## 2022-12-09 ENCOUNTER — Encounter: Payer: Self-pay | Admitting: Gastroenterology

## 2023-01-02 ENCOUNTER — Telehealth: Payer: Self-pay

## 2023-01-02 NOTE — Telephone Encounter (Signed)
Unable to reach patient for PV apt. VM left. Pt will need to call to r/s PV before 5 PM to avoid cancellation of upcoming colonoscopy.

## 2023-01-15 ENCOUNTER — Encounter: Payer: Medicare HMO | Admitting: Gastroenterology

## 2023-02-13 ENCOUNTER — Emergency Department (HOSPITAL_COMMUNITY)
Admission: EM | Admit: 2023-02-13 | Discharge: 2023-02-13 | Discharge status: Against Medical Advice | Payer: 59 | Attending: Emergency Medicine | Admitting: Emergency Medicine

## 2023-02-13 ENCOUNTER — Encounter (HOSPITAL_COMMUNITY): Payer: Self-pay | Admitting: Emergency Medicine

## 2023-02-13 ENCOUNTER — Other Ambulatory Visit: Payer: Self-pay

## 2023-02-13 DIAGNOSIS — R109 Unspecified abdominal pain: Secondary | ICD-10-CM | POA: Insufficient documentation

## 2023-02-13 DIAGNOSIS — Z5321 Procedure and treatment not carried out due to patient leaving prior to being seen by health care provider: Secondary | ICD-10-CM | POA: Insufficient documentation

## 2023-02-13 NOTE — ED Triage Notes (Signed)
BIBA Per EMS: pt c/o abd pain all day. Pt reports no bowel movement x 1 week. Pt tried taking milk of mag w/ no relief.

## 2023-02-13 NOTE — ED Notes (Signed)
Attempted to get labs x2.
